# Patient Record
Sex: Female | Born: 1955 | Race: White | Hispanic: No | Marital: Married | Smoking: Never smoker
Health system: Southern US, Community
[De-identification: ages and names within clinical notes are randomized; demographics above are authoritative.]

## PROBLEM LIST (undated history)

## (undated) DIAGNOSIS — M26629 Arthralgia of temporomandibular joint, unspecified side: Secondary | ICD-10-CM

## (undated) DIAGNOSIS — B019 Varicella without complication: Secondary | ICD-10-CM

## (undated) DIAGNOSIS — M545 Low back pain: Secondary | ICD-10-CM

## (undated) DIAGNOSIS — B269 Mumps without complication: Secondary | ICD-10-CM

## (undated) DIAGNOSIS — M1711 Unilateral primary osteoarthritis, right knee: Secondary | ICD-10-CM

## (undated) DIAGNOSIS — M170 Bilateral primary osteoarthritis of knee: Secondary | ICD-10-CM

## (undated) DIAGNOSIS — M8580 Other specified disorders of bone density and structure, unspecified site: Secondary | ICD-10-CM

## (undated) DIAGNOSIS — N390 Urinary tract infection, site not specified: Secondary | ICD-10-CM

## (undated) DIAGNOSIS — E782 Mixed hyperlipidemia: Secondary | ICD-10-CM

## (undated) DIAGNOSIS — B059 Measles without complication: Secondary | ICD-10-CM

## (undated) DIAGNOSIS — G4700 Insomnia, unspecified: Secondary | ICD-10-CM

## (undated) DIAGNOSIS — R51 Headache: Secondary | ICD-10-CM

## (undated) DIAGNOSIS — Z87440 Personal history of urinary (tract) infections: Secondary | ICD-10-CM

## (undated) DIAGNOSIS — Z0000 Encounter for general adult medical examination without abnormal findings: Secondary | ICD-10-CM

## (undated) DIAGNOSIS — M533 Sacrococcygeal disorders, not elsewhere classified: Secondary | ICD-10-CM

## (undated) DIAGNOSIS — G4730 Sleep apnea, unspecified: Secondary | ICD-10-CM

## (undated) HISTORY — PX: WISDOM TOOTH EXTRACTION: SHX21

## (undated) HISTORY — DX: Bilateral primary osteoarthritis of knee: M17.0

## (undated) HISTORY — DX: Mumps without complication: B26.9

## (undated) HISTORY — DX: Headache: R51

## (undated) HISTORY — DX: Sacrococcygeal disorders, not elsewhere classified: M53.3

## (undated) HISTORY — DX: Arthralgia of temporomandibular joint, unspecified side: M26.629

## (undated) HISTORY — DX: Insomnia, unspecified: G47.00

## (undated) HISTORY — DX: Measles without complication: B05.9

## (undated) HISTORY — DX: Mixed hyperlipidemia: E78.2

## (undated) HISTORY — DX: Unilateral primary osteoarthritis, right knee: M17.11

## (undated) HISTORY — DX: Encounter for general adult medical examination without abnormal findings: Z00.00

## (undated) HISTORY — DX: Personal history of urinary (tract) infections: Z87.440

## (undated) HISTORY — DX: Sleep apnea, unspecified: G47.30

## (undated) HISTORY — DX: Low back pain: M54.5

## (undated) HISTORY — PX: APPENDECTOMY: SHX54

## (undated) HISTORY — DX: Other specified disorders of bone density and structure, unspecified site: M85.80

## (undated) HISTORY — PX: MOLE REMOVAL: SHX2046

## (undated) HISTORY — DX: Urinary tract infection, site not specified: N39.0

## (undated) HISTORY — DX: Varicella without complication: B01.9

---

## 1997-11-03 ENCOUNTER — Other Ambulatory Visit: Admission: RE | Admit: 1997-11-03 | Discharge: 1997-11-03 | Payer: Self-pay | Admitting: Obstetrics and Gynecology

## 1998-06-05 ENCOUNTER — Encounter: Payer: Self-pay | Admitting: Emergency Medicine

## 1998-06-05 ENCOUNTER — Emergency Department (HOSPITAL_COMMUNITY): Admission: EM | Admit: 1998-06-05 | Discharge: 1998-06-05 | Payer: Self-pay | Admitting: Emergency Medicine

## 1998-11-17 ENCOUNTER — Other Ambulatory Visit: Admission: RE | Admit: 1998-11-17 | Discharge: 1998-11-17 | Payer: Self-pay | Admitting: Obstetrics and Gynecology

## 1999-11-26 ENCOUNTER — Other Ambulatory Visit: Admission: RE | Admit: 1999-11-26 | Discharge: 1999-11-26 | Payer: Self-pay | Admitting: Obstetrics and Gynecology

## 2000-07-21 ENCOUNTER — Ambulatory Visit (HOSPITAL_BASED_OUTPATIENT_CLINIC_OR_DEPARTMENT_OTHER): Admission: RE | Admit: 2000-07-21 | Discharge: 2000-07-21 | Payer: Self-pay | Admitting: *Deleted

## 2000-11-19 ENCOUNTER — Other Ambulatory Visit: Admission: RE | Admit: 2000-11-19 | Discharge: 2000-11-19 | Payer: Self-pay | Admitting: Obstetrics and Gynecology

## 2001-11-24 ENCOUNTER — Other Ambulatory Visit: Admission: RE | Admit: 2001-11-24 | Discharge: 2001-11-24 | Payer: Self-pay | Admitting: Obstetrics and Gynecology

## 2002-07-16 ENCOUNTER — Encounter: Payer: Self-pay | Admitting: Family Medicine

## 2002-07-16 ENCOUNTER — Encounter: Admission: RE | Admit: 2002-07-16 | Discharge: 2002-07-16 | Payer: Self-pay | Admitting: Family Medicine

## 2002-11-29 ENCOUNTER — Other Ambulatory Visit: Admission: RE | Admit: 2002-11-29 | Discharge: 2002-11-29 | Payer: Self-pay | Admitting: Obstetrics and Gynecology

## 2003-12-14 ENCOUNTER — Other Ambulatory Visit: Admission: RE | Admit: 2003-12-14 | Discharge: 2003-12-14 | Payer: Self-pay | Admitting: Obstetrics and Gynecology

## 2004-03-16 ENCOUNTER — Encounter: Admission: RE | Admit: 2004-03-16 | Discharge: 2004-03-16 | Payer: Self-pay | Admitting: Family Medicine

## 2004-04-15 HISTORY — PX: OTHER SURGICAL HISTORY: SHX169

## 2004-09-19 ENCOUNTER — Encounter: Admission: RE | Admit: 2004-09-19 | Discharge: 2004-09-19 | Payer: Self-pay | Admitting: Family Medicine

## 2005-02-06 ENCOUNTER — Other Ambulatory Visit: Admission: RE | Admit: 2005-02-06 | Discharge: 2005-02-06 | Payer: Self-pay | Admitting: Obstetrics and Gynecology

## 2006-02-07 ENCOUNTER — Other Ambulatory Visit: Admission: RE | Admit: 2006-02-07 | Discharge: 2006-02-07 | Payer: Self-pay | Admitting: Obstetrics and Gynecology

## 2007-02-11 ENCOUNTER — Other Ambulatory Visit: Admission: RE | Admit: 2007-02-11 | Discharge: 2007-02-11 | Payer: Self-pay | Admitting: Obstetrics and Gynecology

## 2007-03-02 ENCOUNTER — Ambulatory Visit: Payer: Self-pay | Admitting: Internal Medicine

## 2007-03-17 ENCOUNTER — Ambulatory Visit: Payer: Self-pay | Admitting: Internal Medicine

## 2008-02-12 ENCOUNTER — Other Ambulatory Visit: Admission: RE | Admit: 2008-02-12 | Discharge: 2008-02-12 | Payer: Self-pay | Admitting: Obstetrics and Gynecology

## 2010-08-31 NOTE — Op Note (Signed)
[place]  Patient:    JANSEN, GOODPASTURE                        MRN: [id number] Proc. Date: 07/21/00 Adm. Date:  [id number] Disc. Date: [id number] Attending:  Melody Comas.                           Operative Report  PREOPERATIVE DIAGNOSES: 1. Dysplastic nevus of right medial thigh, 1.0 cm. 2. Dysplastic nevus of lower back, 1.0 cm.  POSTOPERATIVE DIAGNOSES: 1. Dysplastic nevus of right medial thigh, 1.0 cm. 2. Dysplastic nevus of lower back, 1.0 cm.  PROCEDURE: 1. Excisional biopsy of 1 cm dysplastic nevus of the right medial thigh. 2. Excisional biopsy of 1 cm dysplastic nevus of the lower back.  SURGEON:  Desmond Dike, M.D.  ANESTHESIA:  Local.  There is no first assistant.  INDICATIONS:  Dawnna Gritz is a 55 year old woman who presents with biopsy proven dysplastic nevi of the right medial thigh, and lower back.  She was referred to me for re-excision of these areas.  DESCRIPTION OF PROCEDURE:  The patient was brought back to the minor surgery operating room in the [facility] and placed on the table in the supine position.  The right lower extremity was slightly frog-legged to allow access to the medial thigh.  This area was sterilely prepped and draped. I designed an elliptically shaped excision along the long axis of the extremity with grossly clear tissue margins.  After infiltrating the soft tissues with 1% Xylocaine containing epinephrine this lesion was excised full thickness.  The resulting defect was closed in layers.  Interrupted, buried, Vicryl sutures were placed for wound edge approximation followed by a running monofilament 5-0 nylon suture for final epidermal leveling.  This area was sterilely dressed.  Attention was then directed to the lesion of the left lower back.  The patient was placed in the prone position to access this area.  This area was also sterilely prepped and sterilely draped.  This lesion  was excised elliptically with the long axis of the ellipse oriented transversely.  After infiltrating the soft tissues with 1% Xylocaine containing epinephrine, the lesion was excised full thickness.  This was passed off of the operative field as the first lesion had been for pathologic analysis.  The resulting defect was closed as previously described for the lesion of the medial thigh.  After sterilely dressing the lower back lesion, the patient was given wound care instructions.  She will plan on seeing me back in the office within the next week, for a wound check, suture removal, and review of her final pathology report. DD:  07/29/00 TD:  07/29/00 Job: [id number]

## 2011-12-04 DIAGNOSIS — G8929 Other chronic pain: Secondary | ICD-10-CM | POA: Insufficient documentation

## 2012-02-14 LAB — HM MAMMOGRAPHY: HM Mammogram: NORMAL

## 2012-08-07 ENCOUNTER — Other Ambulatory Visit: Payer: Self-pay | Admitting: Obstetrics & Gynecology

## 2012-08-07 ENCOUNTER — Ambulatory Visit (INDEPENDENT_AMBULATORY_CARE_PROVIDER_SITE_OTHER): Payer: BC Managed Care – PPO | Admitting: Obstetrics and Gynecology

## 2012-08-07 ENCOUNTER — Encounter: Payer: Self-pay | Admitting: Certified Nurse Midwife

## 2012-08-07 ENCOUNTER — Encounter: Payer: Self-pay | Admitting: Obstetrics and Gynecology

## 2012-08-07 VITALS — BP 120/70 | Ht 66.0 in | Wt 150.0 lb

## 2012-08-07 DIAGNOSIS — R300 Dysuria: Secondary | ICD-10-CM

## 2012-08-07 DIAGNOSIS — N952 Postmenopausal atrophic vaginitis: Secondary | ICD-10-CM

## 2012-08-07 LAB — POCT URINALYSIS DIPSTICK
Glucose, UA: NEGATIVE
Nitrite, UA: NEGATIVE
Urobilinogen, UA: NEGATIVE

## 2012-08-07 MED ORDER — ESTRADIOL 0.1 MG/GM VA CREA: TOPICAL_CREAM | VAGINAL | Status: DC

## 2012-08-07 NOTE — Progress Notes (Signed)
Patient ID: Ellen Morrow, female   DOB: 1955-05-14, 57 y.o.   MRN: [id number]  Subjective 57 year old Caucasian female with LMP 2 years ago presents in follow up to recent bladder infection treated at Urgent Care.  Had burning, frequency, and hematuria.  Treated with Septra DS. Doesn't hurt like it did but feels urgency.  Notes some burning at the end of the stream.  No hematuria.  No fevers or chills.  No nausea or vomiting. Denies vaginal discharge.  Notes vaginal dryness and dyspareunia.  States UTI occurred after intercourse.  Doubling dose of cranberry pills.  History of similar episode in fall 2013 when diagnosed with E Coli UTI.  Objective Normal external genitalia and urethra.  Vagina and cervix without lesions.  Yellow-orange very slight discharge.  No odor.  Uterus small and nontender.  No adnexal masses or tenderness.  Wet prep - No clue cells.  No hyphae.  PH 5.5 UA dip - negative  Assessment Status post post coital UTI Atrophic vaginitis.  Plan Urine culture sent. Told patient the acidity of the cranberry may cause some frequency of urination. Rx for Estrace vaginal cream.  See EPIC orders. Patient will come in for a recheck in 6 weeks to see response to Estrace.

## 2012-08-07 NOTE — Progress Notes (Signed)
Patient called through on-call service.  Prescription sent via EPIC today by Dr. Edward Jolly not at pharmacy.  Reviewed notes.  Patient with atrophic vaginitis.  Estrace vaginal cream prescribed and sent to pharmacy as documented.  Confirmed with pharmacy no RX received.  Called in Estrace cream 1gm pv twice weekly, #42.5 gram tube, 2RF as per note from today.  Spoke with patient and informed RX called in with refills as per Dr. Rica Records note.  Patient very appreciative.

## 2012-08-07 NOTE — Patient Instructions (Addendum)
Atrophic Vaginitis Atrophic vaginitis is a problem of low levels of estrogen in women. This problem can happen at any age. It is most common in women who have gone through menopause ("the change").  HOW WILL I KNOW IF I HAVE THIS PROBLEM? You may have:  Trouble with peeing (urinating), such as:  Going to the bathroom often.  A hard time holding your pee until you reach a bathroom.  Leaking pee.  Having pain when you pee.  Itching or a burning feeling.  Vaginal bleeding and spotting.  Pain during sex.  Dryness of the vagina.  A yellow, bad-smelling fluid (discharge) coming from the vagina. HOW WILL MY DOCTOR CHECK FOR THIS PROBLEM?  During your exam, your doctor will likely find the problem.  If there is a vaginal fluid, it may be checked for infection. HOW WILL THIS PROBLEM BE TREATED? Keep the vulvar skin as clean as possible. Moisturizers and lubricants can help with some of the symptoms. Estrogen replacement can help. There are 2 ways to take estrogen:  Systemic estrogen gets estrogen to your whole body. It takes many weeks or months before the symptoms get better.  You take an estrogen pill.  You use a skin patch. This is a patch that you put on your skin.  If you still have your uterus, your doctor may ask you to take a hormone. Talk to your doctor about the right medicine for you.  Estrogen cream.  This puts estrogen only at the part of your body where you apply it. The cream is put into the vagina or put on the vulvar skin. For some women, estrogen cream works faster than pills or the patch. CAN ALL WOMEN WITH THIS PROBLEM USE ESTROGEN? No. Women with certain types of cancer, liver problems, or problems with blood clots should not take estrogen. Your doctor can help you decide the best treatment for your symptoms. Document Released: 09/18/2007 Document Revised: 06/24/2011 Document Reviewed: 09/18/2007 Exit[employer] Patient Information 2013 [employer].  

## 2012-08-10 ENCOUNTER — Telehealth: Payer: Self-pay

## 2012-08-10 LAB — URINE CULTURE: Colony Count: 50000

## 2012-08-10 NOTE — Telephone Encounter (Signed)
Message copied by Alphonsa Overall on Mon Aug 10, 2012  4:05 PM ------      Message from: Conley Simmonds      Created: Mon Aug 10, 2012  7:52 AM       Please contact patient with urine culture results.  No one organism grew, just a mixtures of bacteria but a fair amount.  If still having any dysuria at all, please send to the pharmacy, Bactrim DS 1 tab po bid for 5 more days.            Thanks,            [employer] ------

## 2012-08-10 NOTE — Telephone Encounter (Signed)
Patient notified of urine culture results.  Called in [facility] to [facility].

## 2012-08-10 NOTE — Telephone Encounter (Signed)
lmovm to call for test results. 

## 2012-08-12 ENCOUNTER — Telehealth: Payer: Self-pay | Admitting: Obstetrics and Gynecology

## 2012-08-12 MED ORDER — CIPROFLOXACIN HCL 500 MG PO TABS: 500.0000 mg | ORAL_TABLET | Freq: Two times a day (BID) | ORAL | Status: DC

## 2012-08-12 NOTE — Telephone Encounter (Signed)
PT WOULD LIKE A FU VISIT WITH DR Edward Jolly TOMORROW IF POSSIBLE.  SHE IS NOT FEELING ANY BETTER.

## 2012-08-12 NOTE — Telephone Encounter (Signed)
Patient still with UTI symptoms and feels no better @ 5 days of Septra. Per Dr. Edward Jolly orders will change antibiotic to Cipro 500mg  BID x 7 days. Patient is to follow up with Dr. Edward Jolly @ 7 days of antibiotic to recheck urine and culture. Patient also instructed to continue use of vagenal cream as ordered 1/2 gram only. [facility].

## 2012-08-24 ENCOUNTER — Ambulatory Visit (INDEPENDENT_AMBULATORY_CARE_PROVIDER_SITE_OTHER): Payer: BC Managed Care – PPO

## 2012-08-24 DIAGNOSIS — N390 Urinary tract infection, site not specified: Secondary | ICD-10-CM

## 2012-08-24 LAB — POCT URINALYSIS DIPSTICK
Blood, UA: NEGATIVE
Ketones, UA: NEGATIVE
Protein, UA: NEGATIVE
pH, UA: 6

## 2012-08-25 LAB — URINE CULTURE: Colony Count: NO GROWTH

## 2012-09-28 ENCOUNTER — Ambulatory Visit: Payer: BC Managed Care – PPO | Admitting: Obstetrics and Gynecology

## 2012-10-01 ENCOUNTER — Ambulatory Visit (INDEPENDENT_AMBULATORY_CARE_PROVIDER_SITE_OTHER): Payer: BC Managed Care – PPO | Admitting: Obstetrics and Gynecology

## 2012-10-01 ENCOUNTER — Encounter: Payer: Self-pay | Admitting: Obstetrics and Gynecology

## 2012-10-01 VITALS — BP 120/70 | HR 68 | Ht 66.0 in | Wt 147.0 lb

## 2012-10-01 DIAGNOSIS — N390 Urinary tract infection, site not specified: Secondary | ICD-10-CM

## 2012-10-01 DIAGNOSIS — N952 Postmenopausal atrophic vaginitis: Secondary | ICD-10-CM

## 2012-10-01 DIAGNOSIS — Z87440 Personal history of urinary (tract) infections: Secondary | ICD-10-CM

## 2012-10-01 LAB — POCT URINALYSIS DIPSTICK
Protein, UA: NEGATIVE
Urobilinogen, UA: NEGATIVE
pH, UA: 5

## 2012-10-01 MED ORDER — NITROFURANTOIN MONOHYD MACRO 100 MG PO CAPS: 100.0000 mg | ORAL_CAPSULE | Freq: Two times a day (BID) | ORAL | Status: DC

## 2012-10-01 NOTE — Patient Instructions (Signed)
Please take the Macribid antibiotic 100 mg twice a day for three days if you develop pain and burning with urination. Call the office for an appointment if you do not improve after taking the antibiotic for two days.

## 2012-10-01 NOTE — Progress Notes (Signed)
Patient ID: Ellen Morrow, female   DOB: June 11, 1955, 57 y.o.   MRN: [id number]  Subjective  Patient here for atrophic vaginitis recheck and follow up culture for UTI.  Had UC 4/25 showing mixed bacteria 50,000 colonies and follow up UC 5/12 which was negative.  When has UTIs, symptoms come on quickly and has hematuria. Takes Pyridium until can get in to Urgent Care for visit.    Using vaginal estrogen cream Estrace for two weeks.  Forgetting to take medication.  Feels better when uses this.    Voiding after intercourse now.  Drinking less tea and more water.  Hasn't had UTIs up until November 2013.  No LMP in 2 years.    Some night sweats.  No hot flashes.    Objective  Urine dip - negative  Pelvic - normal external genitalia and urethra.  Thin white discharge in vagina.  No lesions.  Rugae noted.  Cervix no lesions.  Small and nontender uterus.  No adnexal masses or tenderness.  Assessment  Atrophic vaginitis. Recurrent UTIs.  Plan  Use Estrace 1/2 gram nightly twice a week. Rx for Macrobid 100 mg po bid for 3 days prn dysuria. Return to the office if take abx for 2 days and no improvement or if has hematuria so UC can be performed. Annual exam in January 2015.

## 2013-02-05 ENCOUNTER — Encounter: Payer: Self-pay | Admitting: Family Medicine

## 2013-02-05 ENCOUNTER — Ambulatory Visit (INDEPENDENT_AMBULATORY_CARE_PROVIDER_SITE_OTHER): Payer: BC Managed Care – PPO | Admitting: Family Medicine

## 2013-02-05 ENCOUNTER — Ambulatory Visit (HOSPITAL_BASED_OUTPATIENT_CLINIC_OR_DEPARTMENT_OTHER)
Admission: RE | Admit: 2013-02-05 | Discharge: 2013-02-05 | Disposition: A | Discharge status: Home / Self Care | Payer: BC Managed Care – PPO | Source: Ambulatory Visit | Attending: Family Medicine | Admitting: Family Medicine

## 2013-02-05 VITALS — BP 118/72 | HR 83 | Temp 98.3°F | Ht 66.0 in | Wt 154.0 lb

## 2013-02-05 DIAGNOSIS — M25569 Pain in unspecified knee: Secondary | ICD-10-CM | POA: Insufficient documentation

## 2013-02-05 DIAGNOSIS — G8929 Other chronic pain: Secondary | ICD-10-CM

## 2013-02-05 DIAGNOSIS — Z87440 Personal history of urinary (tract) infections: Secondary | ICD-10-CM

## 2013-02-05 DIAGNOSIS — M1711 Unilateral primary osteoarthritis, right knee: Secondary | ICD-10-CM

## 2013-02-05 DIAGNOSIS — Z0000 Encounter for general adult medical examination without abnormal findings: Secondary | ICD-10-CM

## 2013-02-05 DIAGNOSIS — N390 Urinary tract infection, site not specified: Secondary | ICD-10-CM

## 2013-02-05 DIAGNOSIS — Z23 Encounter for immunization: Secondary | ICD-10-CM

## 2013-02-05 MED ORDER — PHENAZOPYRIDINE HCL 200 MG PO TABS: 200.0000 mg | ORAL_TABLET | Freq: Three times a day (TID) | ORAL | Status: DC | PRN

## 2013-02-05 NOTE — Progress Notes (Signed)
Patient ID: Ellen Morrow, female   DOB: [birth year], 56 y.o.   MRN: [id number] CHRISTEN WARDROP [id number] 03/18/1956 02/05/2013      Progress Note-Follow Up  Subjective  Chief Complaint  Chief Complaint  Patient presents with  . Establish Care    new patient  . Injections    prevnar and td    HPI  Patient is a 57 year old Caucasian female who is in today to establish care. She is generally in good health although she has had 2 episodes of urinary tract infections in the last year. No symptoms at the present time. Her only other chief concern is of some anterior right knee pain and crepitus. Denies any significant injury swelling warmth or redness. No falls. No complaints of headache, chest pain, palpitations or shortness of breath. No GI concerns noted. Has historically just followed with OB/GYN.  Past Medical History  Diagnosis Date  . TMJ syndrome   . UTI (urinary tract infection)   . Chicken pox as a child  . Measles as a child  . Mumps as a child    Past Surgical History  Procedure Laterality Date  . Scar tissue break up  2006    Dr Gwendlyn Deutscher  . Wisdom tooth extraction  as a teenager  . Appendectomy  3 rd grade  . Mole removal      benign- mole on right thigh    Family History  Problem Relation Age of Onset  . Osteoporosis Mother   . Diabetes Mother     type 2  . Cancer Mother 46    stomach  . Colon polyps Mother     benign  . Hypertension Father   . Hyperlipidemia Father   . Other Sister     herniated disk in back, low blood pressure  . Hernia Brother     X 2  . Hypertension Sister   . Depression Sister     History   Social History  . Marital Status: Married    Spouse Name: N/A    Number of Children: N/A  . Years of Education: N/A   Occupational History  . Not on file.   Social History Main Topics  . Smoking status: Never Smoker   . Smokeless tobacco: Not on file  . Alcohol Use: Yes     Comment: occasional wine  . Drug Use: No  . Sexual  Activity: Yes    Birth Control/ Protection: Post-menopausal   Other Topics Concern  . Not on file   Social History Narrative  . No narrative on file    Current Outpatient Prescriptions on File Prior to Visit  Medication Sig Dispense Refill  . estradiol (ESTRACE) 0.1 MG/GM vaginal cream Use 1/2 g vaginally every night for the first 2 weeks, then use 1/2 g vaginally two or three times per week as needed to maintain symptom relief.  42.5 g  2  . nitrofurantoin, macrocrystal-monohydrate, (MACROBID) 100 MG capsule Take 1 capsule (100 mg total) by mouth 2 (two) times daily. Take 1 capsule (100mg ) by mouth 2 times daily for three days if your develop significant burning and discomfort with urination.  30 capsule  0   No current facility-administered medications on file prior to visit.    Allergies  Allergen Reactions  . Penicillins Rash    Review of Systems  Review of Systems  Constitutional: Negative for fever, chills and malaise/fatigue.  HENT: Negative for congestion, hearing loss and nosebleeds.   Eyes: Negative for  discharge.  Respiratory: Negative for cough, sputum production, shortness of breath and wheezing.   Cardiovascular: Negative for chest pain, palpitations and leg swelling.  Gastrointestinal: Negative for heartburn, nausea, vomiting, abdominal pain, diarrhea, constipation and blood in stool.  Genitourinary: Negative for dysuria, urgency, frequency and hematuria.  Musculoskeletal: Positive for joint pain. Negative for back pain, falls and myalgias.       Right knee pain and crepitus  Skin: Negative for rash.  Neurological: Negative for dizziness, tremors, sensory change, focal weakness, loss of consciousness, weakness and headaches.  Endo/Heme/Allergies: Negative for polydipsia. Does not bruise/bleed easily.  Psychiatric/Behavioral: Negative for depression and suicidal ideas. The patient is not nervous/anxious and does not have insomnia.     Objective  BP 118/72   Pulse 83  Temp(Src) 98.3 F (36.8 C) (Oral)  Ht 5\' 6"  (1.676 m)  Wt 154 lb (69.854 kg)  BMI 24.87 kg/m2  SpO2 97%  LMP 04/15/2008  Physical Exam  Physical Exam  Constitutional: She is oriented to person, place, and time and well-developed, well-nourished, and in no distress. No distress.  HENT:  Head: Normocephalic and atraumatic.  Eyes: Conjunctivae are normal.  Neck: Neck supple. No thyromegaly present.  Cardiovascular: Normal rate, regular rhythm and normal heart sounds.   No murmur heard. Pulmonary/Chest: Effort normal and breath sounds normal. She has no wheezes.  Abdominal: She exhibits no distension and no mass.  Musculoskeletal: She exhibits no edema.  Lymphadenopathy:    She has no cervical adenopathy.  Neurological: She is alert and oriented to person, place, and time.  Skin: Skin is warm and dry. No rash noted. She is not diaphoretic.  Psychiatric: Memory, affect and judgment normal.     Assessment & Plan   Preventative health care Encouraged heart healthy diet and regular exercise, given pneumonia shot today, took flu shot earlier in month. Encouraged daily fiber, probiotics, krill oil caps  Arthritis of right knee Describes discomfort and crepitus, encouraged krill oil and salon pas prn, stay active  Hx: UTI (urinary tract infection) Encouraged adequate hydration, cranberry tabs and probiotics. Already has Macrobid from her GYN to use prn is given some Pyridium to use prn for pain

## 2013-02-05 NOTE — Progress Notes (Signed)
Quick Note:  Patient Informed and voiced understanding. Pt states she doesn't want the referral right now she will call back if she changes your mind ______

## 2013-02-05 NOTE — Patient Instructions (Addendum)
Salon Pas patch or cream Krill oil caps such as MegaRed daily Calcium Citrate/vitamin D (citracal) Probiotic such as Digestive Advantage Fiber such as Psyllium   Preventive Care for Adults, Female A healthy lifestyle and preventive care can promote health and wellness. Preventive health guidelines for women include the following key practices.  A routine yearly physical is a good way to check with your caregiver about your health and preventive screening. It is a chance to share any concerns and updates on your health, and to receive a thorough exam.  Visit your dentist for a routine exam and preventive care every 6 months. Brush your teeth twice a day and floss once a day. Good oral hygiene prevents tooth decay and gum disease.  The frequency of eye exams is based on your age, health, family medical history, use of contact lenses, and other factors. Follow your caregiver's recommendations for frequency of eye exams.  Eat a healthy diet. Foods like vegetables, fruits, whole grains, low-fat dairy products, and lean protein foods contain the nutrients you need without too many calories. Decrease your intake of foods high in solid fats, added sugars, and salt. Eat the right amount of calories for you.Get information about a proper diet from your caregiver, if necessary.  Regular physical exercise is one of the most important things you can do for your health. Most adults should get at least 150 minutes of moderate-intensity exercise (any activity that increases your heart rate and causes you to sweat) each week. In addition, most adults need muscle-strengthening exercises on 2 or more days a week.  Maintain a healthy weight. The body mass index (BMI) is a screening tool to identify possible weight problems. It provides an estimate of body fat based on height and weight. Your caregiver can help determine your BMI, and can help you achieve or maintain a healthy weight.For adults 20 years and  older:  A BMI below 18.5 is considered underweight.  A BMI of 18.5 to 24.9 is normal.  A BMI of 25 to 29.9 is considered overweight.  A BMI of 30 and above is considered obese.  Maintain normal blood lipids and cholesterol levels by exercising and minimizing your intake of saturated fat. Eat a balanced diet with plenty of fruit and vegetables. Blood tests for lipids and cholesterol should begin at age 46 and be repeated every 5 years. If your lipid or cholesterol levels are high, you are over 50, or you are at high risk for heart disease, you may need your cholesterol levels checked more frequently.Ongoing high lipid and cholesterol levels should be treated with medicines if diet and exercise are not effective.  If you smoke, find out from your caregiver how to quit. If you do not use tobacco, do not start.  If you are pregnant, do not drink alcohol. If you are breastfeeding, be very cautious about drinking alcohol. If you are not pregnant and choose to drink alcohol, do not exceed 1 drink per day. One drink is considered to be 12 ounces (355 mL) of beer, 5 ounces (148 mL) of wine, or 1.5 ounces (44 mL) of liquor.  Avoid use of street drugs. Do not share needles with anyone. Ask for help if you need support or instructions about stopping the use of drugs.  High blood pressure causes heart disease and increases the risk of stroke. Your blood pressure should be checked at least every 1 to 2 years. Ongoing high blood pressure should be treated with medicines if weight loss  and exercise are not effective.  If you are 30 to 56 years old, ask your caregiver if you should take aspirin to prevent strokes.  Diabetes screening involves taking a blood sample to check your fasting blood sugar level. This should be done once every 3 years, after age 88, if you are within normal weight and without risk factors for diabetes. Testing should be considered at a younger age or be carried out more frequently if  you are overweight and have at least 1 risk factor for diabetes.  Breast cancer screening is essential preventive care for women. You should practice "breast self-awareness." This means understanding the normal appearance and feel of your breasts and may include breast self-examination. Any changes detected, no matter how small, should be reported to a caregiver. Women in their 68s and 30s should have a clinical breast exam (CBE) by a caregiver as part of a regular health exam every 1 to 3 years. After age 60, women should have a CBE every year. Starting at age 4, women should consider having a mammography (breast X-ray test) every year. Women who have a family history of breast cancer should talk to their caregiver about genetic screening. Women at a high risk of breast cancer should talk to their caregivers about having magnetic resonance imaging (MRI) and a mammography every year.  The Pap test is a screening test for cervical cancer. A Pap test can show cell changes on the cervix that might become cervical cancer if left untreated. A Pap test is a procedure in which cells are obtained and examined from the lower end of the uterus (cervix).  Women should have a Pap test starting at age 58.  Between ages 3 and 77, Pap tests should be repeated every 2 years.  Beginning at age 63, you should have a Pap test every 3 years as long as the past 3 Pap tests have been normal.  Some women have medical problems that increase the chance of getting cervical cancer. Talk to your caregiver about these problems. It is especially important to talk to your caregiver if a new problem develops soon after your last Pap test. In these cases, your caregiver may recommend more frequent screening and Pap tests.  The above recommendations are the same for women who have or have not gotten the vaccine for human papillomavirus (HPV).  If you had a hysterectomy for a problem that was not cancer or a condition that could  lead to cancer, then you no longer need Pap tests. Even if you no longer need a Pap test, a regular exam is a good idea to make sure no other problems are starting.  If you are between ages 79 and 58, and you have had normal Pap tests going back 10 years, you no longer need Pap tests. Even if you no longer need a Pap test, a regular exam is a good idea to make sure no other problems are starting.  If you have had past treatment for cervical cancer or a condition that could lead to cancer, you need Pap tests and screening for cancer for at least 20 years after your treatment.  If Pap tests have been discontinued, risk factors (such as a new sexual partner) need to be reassessed to determine if screening should be resumed.  The HPV test is an additional test that may be used for cervical cancer screening. The HPV test looks for the virus that can cause the cell changes on the cervix. The  cells collected during the Pap test can be tested for HPV. The HPV test could be used to screen women aged 109 years and older, and should be used in women of any age who have unclear Pap test results. After the age of 76, women should have HPV testing at the same frequency as a Pap test.  Colorectal cancer can be detected and often prevented. Most routine colorectal cancer screening begins at the age of 11 and continues through age 71. However, your caregiver may recommend screening at an earlier age if you have risk factors for colon cancer. On a yearly basis, your caregiver may provide home test kits to check for hidden blood in the stool. Use of a small camera at the end of a tube, to directly examine the colon (sigmoidoscopy or colonoscopy), can detect the earliest forms of colorectal cancer. Talk to your caregiver about this at age 61, when routine screening begins. Direct examination of the colon should be repeated every 5 to 10 years through age 38, unless early forms of pre-cancerous polyps or small growths are  found.  Hepatitis C blood testing is recommended for all people born from [birth year] through 1965 and any individual with known risks for hepatitis C.  Practice safe sex. Use condoms and avoid high-risk sexual practices to reduce the spread of sexually transmitted infections (STIs). STIs include gonorrhea, chlamydia, syphilis, trichomonas, herpes, HPV, and human immunodeficiency virus (HIV). Herpes, HIV, and HPV are viral illnesses that have no cure. They can result in disability, cancer, and death. Sexually active women aged 7 and younger should be checked for chlamydia. Older women with new or multiple partners should also be tested for chlamydia. Testing for other STIs is recommended if you are sexually active and at increased risk.  Osteoporosis is a disease in which the bones lose minerals and strength with aging. This can result in serious bone fractures. The risk of osteoporosis can be identified using a bone density scan. Women ages 50 and over and women at risk for fractures or osteoporosis should discuss screening with their caregivers. Ask your caregiver whether you should take a calcium supplement or vitamin D to reduce the rate of osteoporosis.  Menopause can be associated with physical symptoms and risks. Hormone replacement therapy is available to decrease symptoms and risks. You should talk to your caregiver about whether hormone replacement therapy is right for you.  Use sunscreen with sun protection factor (SPF) of 30 or more. Apply sunscreen liberally and repeatedly throughout the day. You should seek shade when your shadow is shorter than you. Protect yourself by wearing long sleeves, pants, a wide-brimmed hat, and sunglasses year round, whenever you are outdoors.  Once a month, do a whole body skin exam, using a mirror to look at the skin on your back. Notify your caregiver of new moles, moles that have irregular borders, moles that are larger than a pencil eraser, or moles that have  changed in shape or color.  Stay current with required immunizations.  Influenza. You need a dose every fall (or winter). The composition of the flu vaccine changes each year, so being vaccinated once is not enough.  Pneumococcal polysaccharide. You need 1 to 2 doses if you smoke cigarettes or if you have certain chronic medical conditions. You need 1 dose at age 4 (or older) if you have never been vaccinated.  Tetanus, diphtheria, pertussis (Tdap, Td). Get 1 dose of Tdap vaccine if you are younger than age 33, are over 5  and have contact with an infant, are a Dietitian, are pregnant, or simply want to be protected from whooping cough. After that, you need a Td booster dose every 10 years. Consult your caregiver if you have not had at least 3 tetanus and diphtheria-containing shots sometime in your life or have a deep or dirty wound.  HPV. You need this vaccine if you are a woman age 60 or younger. The vaccine is given in 3 doses over 6 months.  Measles, mumps, rubella (MMR). You need at least 1 dose of MMR if you were born in 1957 or later. You may also need a second dose.  Meningococcal. If you are age 45 to 74 and a first-year college student living in a residence hall, or have one of several medical conditions, you need to get vaccinated against meningococcal disease. You may also need additional booster doses.  Zoster (shingles). If you are age 8 or older, you should get this vaccine.  Varicella (chickenpox). If you have never had chickenpox or you were vaccinated but received only 1 dose, talk to your caregiver to find out if you need this vaccine.  Hepatitis A. You need this vaccine if you have a specific risk factor for hepatitis A virus infection or you simply wish to be protected from this disease. The vaccine is usually given as 2 doses, 6 to 18 months apart.  Hepatitis B. You need this vaccine if you have a specific risk factor for hepatitis B virus infection or you  simply wish to be protected from this disease. The vaccine is given in 3 doses, usually over 6 months. Preventive Services / Frequency Ages 78 to 33  Blood pressure check.** / Every 1 to 2 years.  Lipid and cholesterol check.** / Every 5 years beginning at age 4.  Clinical breast exam.** / Every 3 years for women in their 42s and 71s.  Pap test.** / Every 2 years from ages 40 through 104. Every 3 years starting at age 76 through age 11 or 91 with a history of 3 consecutive normal Pap tests.  HPV screening.** / Every 3 years from ages 28 through ages 33 to 23 with a history of 3 consecutive normal Pap tests.  Hepatitis C blood test.** / For any individual with known risks for hepatitis C.  Skin self-exam. / Monthly.  Influenza immunization.** / Every year.  Pneumococcal polysaccharide immunization.** / 1 to 2 doses if you smoke cigarettes or if you have certain chronic medical conditions.  Tetanus, diphtheria, pertussis (Tdap, Td) immunization. / A one-time dose of Tdap vaccine. After that, you need a Td booster dose every 10 years.  HPV immunization. / 3 doses over 6 months, if you are 19 and younger.  Measles, mumps, rubella (MMR) immunization. / You need at least 1 dose of MMR if you were born in 1957 or later. You may also need a second dose.  Meningococcal immunization. / 1 dose if you are age 7 to 44 and a first-year college student living in a residence hall, or have one of several medical conditions, you need to get vaccinated against meningococcal disease. You may also need additional booster doses.  Varicella immunization.** / Consult your caregiver.  Hepatitis A immunization.** / Consult your caregiver. 2 doses, 6 to 18 months apart.  Hepatitis B immunization.** / Consult your caregiver. 3 doses usually over 6 months. Ages 24 to 65  Blood pressure check.** / Every 1 to 2 years.  Lipid and cholesterol check.** /  Every 5 years beginning at age 42.  Clinical breast  exam.** / Every year after age 59.  Mammogram.** / Every year beginning at age 24 and continuing for as long as you are in good health. Consult with your caregiver.  Pap test.** / Every 3 years starting at age 76 through age 20 or 24 with a history of 3 consecutive normal Pap tests.  HPV screening.** / Every 3 years from ages 34 through ages 81 to 22 with a history of 3 consecutive normal Pap tests.  Fecal occult blood test (FOBT) of stool. / Every year beginning at age 41 and continuing until age 50. You may not need to do this test if you get a colonoscopy every 10 years.  Flexible sigmoidoscopy or colonoscopy.** / Every 5 years for a flexible sigmoidoscopy or every 10 years for a colonoscopy beginning at age 73 and continuing until age 44.  Hepatitis C blood test.** / For all people born from [birth year] through 1965 and any individual with known risks for hepatitis C.  Skin self-exam. / Monthly.  Influenza immunization.** / Every year.  Pneumococcal polysaccharide immunization.** / 1 to 2 doses if you smoke cigarettes or if you have certain chronic medical conditions.  Tetanus, diphtheria, pertussis (Tdap, Td) immunization.** / A one-time dose of Tdap vaccine. After that, you need a Td booster dose every 10 years.  Measles, mumps, rubella (MMR) immunization. / You need at least 1 dose of MMR if you were born in 1957 or later. You may also need a second dose.  Varicella immunization.** / Consult your caregiver.  Meningococcal immunization.** / Consult your caregiver.  Hepatitis A immunization.** / Consult your caregiver. 2 doses, 6 to 18 months apart.  Hepatitis B immunization.** / Consult your caregiver. 3 doses, usually over 6 months. Ages 52 and over  Blood pressure check.** / Every 1 to 2 years.  Lipid and cholesterol check.** / Every 5 years beginning at age 30.  Clinical breast exam.** / Every year after age 47.  Mammogram.** / Every year beginning at age 36 and continuing  for as long as you are in good health. Consult with your caregiver.  Pap test.** / Every 3 years starting at age 36 through age 43 or 55 with a 3 consecutive normal Pap tests. Testing can be stopped between 65 and 70 with 3 consecutive normal Pap tests and no abnormal Pap or HPV tests in the past 10 years.  HPV screening.** / Every 3 years from ages 9 through ages 7 or 50 with a history of 3 consecutive normal Pap tests. Testing can be stopped between 65 and 70 with 3 consecutive normal Pap tests and no abnormal Pap or HPV tests in the past 10 years.  Fecal occult blood test (FOBT) of stool. / Every year beginning at age 63 and continuing until age 84. You may not need to do this test if you get a colonoscopy every 10 years.  Flexible sigmoidoscopy or colonoscopy.** / Every 5 years for a flexible sigmoidoscopy or every 10 years for a colonoscopy beginning at age 62 and continuing until age 63.  Hepatitis C blood test.** / For all people born from [birth year] through 1965 and any individual with known risks for hepatitis C.  Osteoporosis screening.** / A one-time screening for women ages 63 and over and women at risk for fractures or osteoporosis.  Skin self-exam. / Monthly.  Influenza immunization.** / Every year.  Pneumococcal polysaccharide immunization.** / 1 dose at age 30 (  or older) if you have never been vaccinated.  Tetanus, diphtheria, pertussis (Tdap, Td) immunization. / A one-time dose of Tdap vaccine if you are over 65 and have contact with an infant, are a Research scientist (physical sciences), or simply want to be protected from whooping cough. After that, you need a Td booster dose every 10 years.  Varicella immunization.** / Consult your caregiver.  Meningococcal immunization.** / Consult your caregiver.  Hepatitis A immunization.** / Consult your caregiver. 2 doses, 6 to 18 months apart.  Hepatitis B immunization.** / Check with your caregiver. 3 doses, usually over 6 months. ** Family history  and personal history of risk and conditions may change your caregiver's recommendations. Document Released: 05/28/2001 Document Revised: 06/24/2011 Document Reviewed: 08/27/2010 [facility] Patient Information 2014 [place], [place].

## 2013-02-07 ENCOUNTER — Encounter: Payer: Self-pay | Admitting: Family Medicine

## 2013-02-07 DIAGNOSIS — Z87440 Personal history of urinary (tract) infections: Secondary | ICD-10-CM

## 2013-02-07 DIAGNOSIS — Z0000 Encounter for general adult medical examination without abnormal findings: Secondary | ICD-10-CM

## 2013-02-07 DIAGNOSIS — M170 Bilateral primary osteoarthritis of knee: Secondary | ICD-10-CM

## 2013-02-07 DIAGNOSIS — M1711 Unilateral primary osteoarthritis, right knee: Secondary | ICD-10-CM

## 2013-02-07 HISTORY — DX: Unilateral primary osteoarthritis, right knee: M17.11

## 2013-02-07 HISTORY — DX: Personal history of urinary (tract) infections: Z87.440

## 2013-02-07 HISTORY — DX: Encounter for general adult medical examination without abnormal findings: Z00.00

## 2013-02-07 HISTORY — DX: Bilateral primary osteoarthritis of knee: M17.0

## 2013-02-07 NOTE — Assessment & Plan Note (Addendum)
Encouraged adequate hydration, cranberry tabs and probiotics. Already has Macrobid from her GYN to use prn is given some Pyridium to use prn for pain

## 2013-02-07 NOTE — Assessment & Plan Note (Addendum)
Encouraged heart healthy diet and regular exercise, given pneumonia shot today, took flu shot earlier in month. Encouraged daily fiber, probiotics, krill oil caps

## 2013-02-07 NOTE — Assessment & Plan Note (Signed)
Describes discomfort and crepitus, encouraged krill oil and salon pas prn, stay active

## 2013-02-10 ENCOUNTER — Telehealth: Payer: Self-pay | Admitting: Family Medicine

## 2013-02-10 NOTE — Telephone Encounter (Signed)
Received medical records from [facility] at [employer]  P: [phone] F: [phone]

## 2013-02-10 NOTE — Telephone Encounter (Signed)
Received medical records from [facility]  P: [phone] F: [phone]

## 2013-02-18 ENCOUNTER — Other Ambulatory Visit: Payer: Self-pay

## 2013-03-15 ENCOUNTER — Encounter: Payer: Self-pay | Admitting: Obstetrics and Gynecology

## 2013-05-13 ENCOUNTER — Ambulatory Visit: Payer: Self-pay | Admitting: Obstetrics and Gynecology

## 2013-05-14 ENCOUNTER — Ambulatory Visit: Payer: Self-pay | Admitting: Obstetrics and Gynecology

## 2013-05-17 ENCOUNTER — Ambulatory Visit (INDEPENDENT_AMBULATORY_CARE_PROVIDER_SITE_OTHER): Payer: BC Managed Care – PPO | Admitting: Obstetrics and Gynecology

## 2013-05-17 ENCOUNTER — Encounter: Payer: Self-pay | Admitting: Obstetrics and Gynecology

## 2013-05-17 VITALS — BP 100/67 | HR 77 | Resp 16 | Ht 66.25 in | Wt 152.0 lb

## 2013-05-17 DIAGNOSIS — Z0000 Encounter for general adult medical examination without abnormal findings: Secondary | ICD-10-CM

## 2013-05-17 DIAGNOSIS — Z01419 Encounter for gynecological examination (general) (routine) without abnormal findings: Secondary | ICD-10-CM

## 2013-05-17 DIAGNOSIS — N952 Postmenopausal atrophic vaginitis: Secondary | ICD-10-CM

## 2013-05-17 LAB — POCT URINALYSIS DIPSTICK
BILIRUBIN UA: NEGATIVE
Blood, UA: NEGATIVE
GLUCOSE UA: NEGATIVE
Ketones, UA: NEGATIVE
Leukocytes, UA: NEGATIVE
Nitrite, UA: NEGATIVE
Protein, UA: NEGATIVE
Urobilinogen, UA: NEGATIVE
pH, UA: 5

## 2013-05-17 MED ORDER — ESTRADIOL 0.1 MG/GM VA CREA: TOPICAL_CREAM | VAGINAL | Status: DC

## 2013-05-17 NOTE — Patient Instructions (Signed)

## 2013-05-17 NOTE — Progress Notes (Signed)
GYNECOLOGY VISIT  PCP:  Mallory Shirk, MD  Referring provider:   HPI: 58 y.o.   Married  Caucasian  female   G2P2 with Patient's last menstrual period was 04/15/2008.   here for   Annual Exam Since June 2014, took Nitrofurantoin 100 mg po bid for 3 days, to treat UTI symptoms.  Needs refill on Estrace vaginal cream.  Uses for dryness and for prevention of UTIs.  Hgb:  PCP Urine:  Neg  GYNECOLOGIC HISTORY: Patient's last menstrual period was 04/15/2008. Sexually active:  yes Partner preference: female Contraception:  Menopausal  Menopausal hormone therapy: yes DES exposure:   no Blood transfusions:   no Sexually transmitted diseases:  no  GYN Procedures:  no Mammogram:   04/2013 normal ([facility])         Pap:   05/12/12 neg, negative HR HPV History of abnormal pap smear:  no   OB History   Grav Para Term Preterm Abortions TAB SAB Ect Mult Living   2 2        2        LIFESTYLE: Exercise:   Cardio twice a week            Tobacco: no Alcohol: occ glass of wine Drug use:  no  OTHER HEALTH MAINTENANCE: Tetanus/TDap: 2014 Gardisil: no Influenza: yes  Zostavax: no  Bone density: 2002 normal Colonoscopy: 2008 normal, repeat 10 years  Cholesterol check: 04/2011 total  225 LDL 123  HDL 73    Family History  Problem Relation Age of Onset  . Osteoporosis Mother   . Diabetes Mother     type 2  . Cancer Mother 51    stomach  . Colon polyps Mother     benign  . Hypertension Father   . Hyperlipidemia Father   . Other Sister     herniated disk in back, low blood pressure  . Hernia Brother     X 2  . Hypertension Sister   . Depression Sister     Patient Active Problem List   Diagnosis Date Noted  . Preventative health care 02/07/2013  . Hx: UTI (urinary tract infection) 02/07/2013  . Arthritis of right knee 02/07/2013   Past Medical History  Diagnosis Date  . TMJ syndrome   . UTI (urinary tract infection)   . Chicken pox as a child  . Measles as a child  .  Mumps as a child  . Preventative health care 02/07/2013  . Hx: UTI (urinary tract infection) 02/07/2013  . Arthritis of right knee 02/07/2013    Past Surgical History  Procedure Laterality Date  . Scar tissue break up  2006    Dr Alfred Levins  . Wisdom tooth extraction  as a teenager  . Appendectomy  3 rd grade  . Mole removal      benign- mole on right thigh    ALLERGIES: Penicillins  Current Outpatient Prescriptions  Medication Sig Dispense Refill  . estradiol (ESTRACE) 0.1 MG/GM vaginal cream Use 1/2 g vaginally every night for the first 2 weeks, then use 1/2 g vaginally two or three times per week as needed to maintain symptom relief.  42.5 g  2  . nitrofurantoin, macrocrystal-monohydrate, (MACROBID) 100 MG capsule Take 1 capsule (100 mg total) by mouth 2 (two) times daily. Take 1 capsule (100mg ) by mouth 2 times daily for three days if your develop significant burning and discomfort with urination.  30 capsule  0  . phenazopyridine (PYRIDIUM) 200 MG tablet Take  1 tablet (200 mg total) by mouth 3 (three) times daily as needed for pain.  10 tablet  1   No current facility-administered medications for this visit.     ROS:  Pertinent items are noted in HPI.  SOCIAL HISTORY:  Married.   PHYSICAL EXAMINATION:    BP 100/67  Pulse 77  Resp 16  Ht 5' 6.25" (1.683 m)  Wt 152 lb (68.947 kg)  BMI 24.34 kg/m2  LMP 04/15/2008   Wt Readings from Last 3 Encounters:  05/17/13 152 lb (68.947 kg)  02/05/13 154 lb (69.854 kg)  10/01/12 147 lb (66.679 kg)     Ht Readings from Last 3 Encounters:  05/17/13 5' 6.25" (1.683 m)  02/05/13 5\' 6"  (1.676 m)  10/01/12 5\' 6"  (1.676 m)    General appearance: alert, cooperative and appears stated age Head: Normocephalic, without obvious abnormality, atraumatic Neck: no adenopathy, supple, symmetrical, trachea midline and thyroid not enlarged, symmetric, no tenderness/mass/nodules Lungs: clear to auscultation bilaterally Breasts: Inspection  negative, No nipple retraction or dimpling, No nipple discharge or bleeding, No axillary or supraclavicular adenopathy, Normal to palpation without dominant masses Heart: regular rate and rhythm Abdomen: soft, non-tender; no masses,  no organomegaly Extremities: extremities normal, atraumatic, no cyanosis or edema Skin: Skin color, texture, turgor normal. No rashes or lesions Lymph nodes: Cervical, supraclavicular, and axillary nodes normal. No abnormal inguinal nodes palpated Neurologic: Grossly normal  Pelvic: External genitalia:  no lesions              Urethra:  normal appearing urethra with no masses, tenderness or lesions              Bartholins and Skenes: normal                 Vagina: normal appearing vagina with normal color and discharge, no lesions              Cervix: normal appearance              Pap and high risk HPV testing done: no.            Bimanual Exam:  Uterus:  uterus is normal size, shape, consistency and nontender                                      Adnexa: normal adnexa in size, nontender and no masses                                      Rectovaginal: Confirms                                      Anus:  normal sphincter tone, no lesions  ASSESSMENT  Normal gynecologic exam. History of recurrent UTIs. Atrophic vaginitis.   PLAN  Mammogram yearly. Encouraged self breast exams. Pap smear and high risk HPV testing in 2019 Counseled on Estrace cream use.  Risks and benefits discussed.   Medications per Epic orders Labs with PCP. Return annually or prn   An After Visit Summary was printed and given to the patient.

## 2013-05-18 ENCOUNTER — Encounter: Payer: BC Managed Care – PPO | Admitting: Family Medicine

## 2013-05-24 ENCOUNTER — Telehealth: Payer: Self-pay | Admitting: Family Medicine

## 2013-05-24 NOTE — Telephone Encounter (Signed)
Received medical records from Dr. Elza Rafter office

## 2013-06-14 ENCOUNTER — Ambulatory Visit (INDEPENDENT_AMBULATORY_CARE_PROVIDER_SITE_OTHER): Payer: BC Managed Care – PPO | Admitting: Family Medicine

## 2013-06-14 ENCOUNTER — Encounter: Payer: Self-pay | Admitting: Family Medicine

## 2013-06-14 VITALS — BP 118/86 | HR 68 | Temp 98.5°F | Ht 66.0 in | Wt 152.1 lb

## 2013-06-14 DIAGNOSIS — E785 Hyperlipidemia, unspecified: Secondary | ICD-10-CM

## 2013-06-14 DIAGNOSIS — N390 Urinary tract infection, site not specified: Secondary | ICD-10-CM

## 2013-06-14 DIAGNOSIS — Z0000 Encounter for general adult medical examination without abnormal findings: Secondary | ICD-10-CM

## 2013-06-14 DIAGNOSIS — Z87440 Personal history of urinary (tract) infections: Secondary | ICD-10-CM

## 2013-06-14 NOTE — Progress Notes (Signed)
Pre visit review using our clinic review tool, if applicable. No additional management support is needed unless otherwise documented below in the visit note. 

## 2013-06-14 NOTE — Patient Instructions (Signed)

## 2013-06-14 NOTE — Progress Notes (Signed)
Patient ID: Ellen Morrow, female   DOB: 1955-09-06, 58 y.o.   MRN: [id number] Ellen Morrow [id number] 16-Jun-1955 06/14/2013      Progress Note-Follow Up  Subjective  Chief Complaint  Chief Complaint  Patient presents with  . Annual Exam    physical    HPI  Patient is a 58 yo female in today for annual exam. Feeling well today. Had a recen tUTI but it has been well treated. Denies any f/c/ha/cp/palp/sob/gi or gu concerns. Has been struggling with right knee pain. Follows with GYN  Past Medical History  Diagnosis Date  . TMJ syndrome   . UTI (urinary tract infection)   . Chicken pox as a child  . Measles as a child  . Mumps as a child  . Preventative health care 02/07/2013  . Hx: UTI (urinary tract infection) 02/07/2013  . Arthritis of right knee 02/07/2013    Past Surgical History  Procedure Laterality Date  . Scar tissue break up  2006    Dr Alfred Levins  . Wisdom tooth extraction  as a teenager  . Appendectomy  3 rd grade  . Mole removal      benign- mole on right thigh    Family History  Problem Relation Age of Onset  . Osteoporosis Mother   . Diabetes Mother     type 2  . Cancer Mother 17    stomach  . Colon polyps Mother     benign  . Hypertension Father   . Hyperlipidemia Father   . Other Sister     herniated disk in back, low blood pressure  . Hernia Brother     X 2  . Hypertension Sister   . Depression Sister     History   Social History  . Marital Status: Married    Spouse Name: N/A    Number of Children: N/A  . Years of Education: N/A   Occupational History  . Not on file.   Social History Main Topics  . Smoking status: Never Smoker   . Smokeless tobacco: Never Used  . Alcohol Use: 0.5 oz/week    1 drink(s) per week     Comment: occasional wine  . Drug Use: No  . Sexual Activity: Yes    Partners: Male    Birth Control/ Protection: Post-menopausal   Other Topics Concern  . Not on file   Social History Narrative  . No  narrative on file    Current Outpatient Prescriptions on File Prior to Visit  Medication Sig Dispense Refill  . estradiol (ESTRACE) 0.1 MG/GM vaginal cream Use 1/2 g vaginally every night for the first 2 weeks, then use 1/2 g vaginally two or three times per week as needed to maintain symptom relief.  42.5 g  2   No current facility-administered medications on file prior to visit.    Allergies  Allergen Reactions  . Penicillins Rash    Review of Systems  Review of Systems  Constitutional: Negative for fever, chills and malaise/fatigue.  HENT: Negative for congestion, hearing loss and nosebleeds.   Eyes: Negative for discharge.  Respiratory: Negative for cough, sputum production, shortness of breath and wheezing.   Cardiovascular: Negative for chest pain, palpitations and leg swelling.  Gastrointestinal: Negative for heartburn, nausea, vomiting, abdominal pain, diarrhea, constipation and blood in stool.  Genitourinary: Negative for dysuria, urgency, frequency and hematuria.  Musculoskeletal: Negative for back pain, falls and myalgias.  Skin: Negative for rash.  Neurological: Negative for dizziness,  tremors, sensory change, focal weakness, loss of consciousness, weakness and headaches.  Endo/Heme/Allergies: Negative for polydipsia. Does not bruise/bleed easily.  Psychiatric/Behavioral: Negative for depression and suicidal ideas. The patient is not nervous/anxious and does not have insomnia.     Objective  BP 118/86  Pulse 68  Temp(Src) 98.5 F (36.9 C) (Oral)  Ht 5\' 6"  (1.676 m)  Wt 152 lb 1.9 oz (69.001 kg)  BMI 24.56 kg/m2  SpO2 97%  LMP 04/15/2008  Physical Exam  Physical Exam  Constitutional: She is oriented to person, place, and time and well-developed, well-nourished, and in no distress. No distress.  HENT:  Head: Normocephalic and atraumatic.  Right Ear: External ear normal.  Left Ear: External ear normal.  Nose: Nose normal.  Mouth/Throat: Oropharynx is  clear and moist. No oropharyngeal exudate.  Eyes: Conjunctivae are normal. Pupils are equal, round, and reactive to light. Right eye exhibits no discharge. Left eye exhibits no discharge. No scleral icterus.  Neck: Normal range of motion. Neck supple. No thyromegaly present.  Cardiovascular: Normal rate, regular rhythm, normal heart sounds and intact distal pulses.   No murmur heard. Pulmonary/Chest: Effort normal and breath sounds normal. No respiratory distress. She has no wheezes. She has no rales.  Abdominal: Soft. Bowel sounds are normal. She exhibits no distension and no mass. There is no tenderness.  Musculoskeletal: Normal range of motion. She exhibits no edema and no tenderness.  Lymphadenopathy:    She has no cervical adenopathy.  Neurological: She is alert and oriented to person, place, and time. She has normal reflexes. No cranial nerve deficit. Coordination normal.  Skin: Skin is warm and dry. No rash noted. She is not diaphoretic.  Psychiatric: Mood, memory and affect normal.      Assessment & Plan    Hx: UTI (urinary tract infection) Recently treated by her GYN. Asymptomatic today.  Preventative health care Encouraged heart healthy diet and regular exercise, encouraged adequate sleep. Encouraged probiotics, reviewed annual labs

## 2013-06-17 ENCOUNTER — Encounter: Payer: BC Managed Care – PPO | Admitting: Family Medicine

## 2013-06-19 NOTE — Assessment & Plan Note (Addendum)
Encouraged heart healthy diet and regular exercise, encouraged adequate sleep. Encouraged probiotics, reviewed annual labs

## 2013-06-19 NOTE — Assessment & Plan Note (Signed)
Recently treated by her GYN. Asymptomatic today.

## 2013-07-02 ENCOUNTER — Telehealth: Payer: Self-pay

## 2013-07-02 NOTE — Telephone Encounter (Signed)
Sorry sent this to [facility] accidentally please just forward to patient in My chart

## 2013-07-02 NOTE — Telephone Encounter (Signed)
Patient left a message stating that she needs to know the procedure codes for renal and hepatic. Patient is verifying with insurance to see if they will cover these tests to be done and they need the procedure codes?  Message can be sent back on mychart

## 2013-07-02 NOTE — Telephone Encounter (Signed)
V70.0 for preventative yearly for both and V13.2 for history of Urinary Tract Infection for renal panel

## 2013-07-27 LAB — LIPID PANEL
CHOL/HDL RATIO: 3.3 ratio
Cholesterol: 212 mg/dL — ABNORMAL HIGH (ref 0–200)
HDL: 64 mg/dL (ref >39)
LDL CALC: 128 mg/dL — AB (ref 0–99)
Triglycerides: 99 mg/dL (ref <150)
VLDL: 20 mg/dL (ref 0–40)

## 2013-07-27 LAB — CBC
HCT: 38.2 % (ref 36.0–46.0)
HEMOGLOBIN: 13.2 g/dL (ref 12.0–15.0)
MCH: 29.7 pg (ref 26.0–34.0)
MCHC: 34.6 g/dL (ref 30.0–36.0)
MCV: 85.8 fL (ref 78.0–100.0)
Platelets: 258 10*3/uL (ref 150–400)
RBC: 4.45 MIL/uL (ref 3.87–5.11)
RDW: 13.6 % (ref 11.5–15.5)
WBC: 9.5 10*3/uL (ref 4.0–10.5)

## 2013-07-27 LAB — RENAL FUNCTION PANEL
ALBUMIN: 4.1 g/dL (ref 3.5–5.2)
BUN: 11 mg/dL (ref 6–23)
CHLORIDE: 103 meq/L (ref 96–112)
CO2: 26 meq/L (ref 19–32)
CREATININE: 0.76 mg/dL (ref 0.50–1.10)
Calcium: 9.3 mg/dL (ref 8.4–10.5)
GLUCOSE: 77 mg/dL (ref 70–99)
Phosphorus: 3.7 mg/dL (ref 2.3–4.6)
Potassium: 4.3 mEq/L (ref 3.5–5.3)
Sodium: 139 mEq/L (ref 135–145)

## 2013-07-27 LAB — HEPATIC FUNCTION PANEL
ALBUMIN: 4.1 g/dL (ref 3.5–5.2)
ALK PHOS: 69 U/L (ref 39–117)
ALT: 12 U/L (ref 0–35)
AST: 15 U/L (ref 0–37)
BILIRUBIN INDIRECT: 0.4 mg/dL (ref 0.2–1.2)
Bilirubin, Direct: 0.1 mg/dL (ref 0.0–0.3)
TOTAL PROTEIN: 6.4 g/dL (ref 6.0–8.3)
Total Bilirubin: 0.5 mg/dL (ref 0.2–1.2)

## 2013-07-27 LAB — TSH: TSH: 1.53 u[IU]/mL (ref 0.350–4.500)

## 2013-07-28 LAB — URINALYSIS
Bilirubin Urine: NEGATIVE
Glucose, UA: NEGATIVE mg/dL
HGB URINE DIPSTICK: NEGATIVE
Ketones, ur: NEGATIVE mg/dL
NITRITE: NEGATIVE
PH: 7 (ref 5.0–8.0)
PROTEIN: NEGATIVE mg/dL
Specific Gravity, Urine: 1.01 (ref 1.005–1.030)
Urobilinogen, UA: 0.2 mg/dL (ref 0.0–1.0)

## 2013-08-04 ENCOUNTER — Encounter: Payer: Self-pay | Admitting: Physician Assistant

## 2013-08-04 ENCOUNTER — Ambulatory Visit (INDEPENDENT_AMBULATORY_CARE_PROVIDER_SITE_OTHER): Payer: BC Managed Care – PPO | Admitting: Physician Assistant

## 2013-08-04 VITALS — BP 110/84 | HR 82 | Temp 98.2°F | Resp 16 | Ht 66.0 in | Wt 151.0 lb

## 2013-08-04 DIAGNOSIS — H9201 Otalgia, right ear: Secondary | ICD-10-CM

## 2013-08-04 DIAGNOSIS — H9209 Otalgia, unspecified ear: Secondary | ICD-10-CM

## 2013-08-04 MED ORDER — METHYLPREDNISOLONE ACETATE 40 MG/ML IJ SUSP: 40.0000 mg | Freq: Once | INTRAMUSCULAR | Status: DC

## 2013-08-04 NOTE — Patient Instructions (Signed)
Apply a topical Aspercreme or icy hot to the bothersome area.  Monitor salt intake.  Alternate tylenol and ibuprofen if needed.  The steroid shot given should help with symptoms.  You can take an over-the-counter Antivert if needed for any residual dizziness.  You will be contacted by an ENT office for further evaluation and treatment, as I cannot see a cause for the ear pain on examination today.

## 2013-08-04 NOTE — Addendum Note (Signed)
Addended by: Rockwell Germany on: 08/04/2013 06:38 PM   Modules accepted: Orders

## 2013-08-04 NOTE — Progress Notes (Signed)
Patient presents to clinic today c/o pain of the anterior portion of her right ear that has been present for 2 months, but worse over the past month.  Denies numbness, tingling or rash.  Denies decrease in hearing or tinnitus.  Has had a couple of episodes of vertigo when lying down over the past 2 months.  Past Medical History  Diagnosis Date  . TMJ syndrome   . UTI (urinary tract infection)   . Chicken pox as a child  . Measles as a child  . Mumps as a child  . Preventative health care 02/07/2013  . Hx: UTI (urinary tract infection) 02/07/2013  . Arthritis of right knee 02/07/2013   Current Outpatient Prescriptions on File Prior to Visit  Medication Sig Dispense Refill  . estradiol (ESTRACE) 0.1 MG/GM vaginal cream Use 1/2 g vaginally every night for the first 2 weeks, then use 1/2 g vaginally two or three times per week as needed to maintain symptom relief.  42.5 g  2   No current facility-administered medications on file prior to visit.    Allergies  Allergen Reactions  . Penicillins Rash    Family History  Problem Relation Age of Onset  . Osteoporosis Mother   . Diabetes Mother     type 2  . Cancer Mother 24    stomach  . Colon polyps Mother     benign  . Hypertension Father   . Hyperlipidemia Father   . Other Sister     herniated disk in back, low blood pressure  . Hernia Brother     X 2  . Hypertension Sister   . Depression Sister     History   Social History  . Marital Status: Married    Spouse Name: N/A    Number of Children: N/A  . Years of Education: N/A   Social History Main Topics  . Smoking status: Never Smoker   . Smokeless tobacco: Never Used  . Alcohol Use: 0.5 oz/week    1 drink(s) per week     Comment: occasional wine  . Drug Use: No  . Sexual Activity: Yes    Partners: Male    Birth Control/ Protection: Post-menopausal   Other Topics Concern  . None   Social History Narrative  . None   Review of Systems - See HPI.  All other ROS  are negative.  BP 110/84  Pulse 82  Temp(Src) 98.2 F (36.8 C) (Oral)  Resp 16  Ht 5\' 6"  (1.676 m)  Wt 151 lb (68.493 kg)  BMI 24.38 kg/m2  SpO2 98%  LMP 04/15/2008  Physical Exam  Constitutional: She is oriented to person, place, and time and well-developed, well-nourished, and in no distress.  HENT:  Head: Normocephalic and atraumatic.  Right Ear: External ear normal.  Left Ear: External ear normal.  Nose: Nose normal.  Mouth/Throat: Oropharynx is clear and moist. No oropharyngeal exudate.  TM within normal limits.  No TTP of sinuses noted.  Negative Dix-Hallpike Maneuver.  Eyes: Conjunctivae are normal. Pupils are equal, round, and reactive to light.  Neck: Neck supple.  Cardiovascular: Normal rate, regular rhythm, normal heart sounds and intact distal pulses.   Pulmonary/Chest: Effort normal and breath sounds normal. No respiratory distress. She has no wheezes. She has no rales. She exhibits no tenderness.  Lymphadenopathy:    She has no cervical adenopathy.  Neurological: She is alert and oriented to person, place, and time. She has normal sensation and normal reflexes. No cranial  nerve deficit.     Recent Results (from the past 2160 hour(s))  POCT URINALYSIS DIPSTICK     Status: Normal   Collection Time    05/17/13  9:16 AM      Result Value Ref Range   Color, UA yellow     Clarity, UA clear     Glucose, UA n     Bilirubin, UA n     Ketones, UA n     Spec Grav, UA       Blood, UA n     pH, UA 5.0     Protein, UA n     Urobilinogen, UA negative     Nitrite, UA n     Leukocytes, UA Negative    CBC     Status: None   Collection Time    07/27/13  9:34 AM      Result Value Ref Range   WBC 9.5  4.0 - 10.5 K/uL   RBC 4.45  3.87 - 5.11 MIL/uL   Hemoglobin 13.2  12.0 - 15.0 g/dL   HCT 65.738.2  84.636.0 - 96.246.0 %   MCV 85.8  78.0 - 100.0 fL   MCH 29.7  26.0 - 34.0 pg   MCHC 34.6  30.0 - 36.0 g/dL   RDW 95.213.6  84.111.5 - 32.415.5 %   Platelets 258  150 - 400 K/uL  RENAL  FUNCTION PANEL     Status: None   Collection Time    07/27/13  9:34 AM      Result Value Ref Range   Sodium 139  135 - 145 mEq/L   Potassium 4.3  3.5 - 5.3 mEq/L   Chloride 103  96 - 112 mEq/L   CO2 26  19 - 32 mEq/L   Glucose, Bld 77  70 - 99 mg/dL   BUN 11  6 - 23 mg/dL   Creat 4.010.76  0.270.50 - 2.531.10 mg/dL   Albumin 4.1  3.5 - 5.2 g/dL   Calcium 9.3  8.4 - 66.410.5 mg/dL   Phosphorus 3.7  2.3 - 4.6 mg/dL  TSH     Status: None   Collection Time    07/27/13  9:34 AM      Result Value Ref Range   TSH 1.530  0.350 - 4.500 uIU/mL  HEPATIC FUNCTION PANEL     Status: None   Collection Time    07/27/13  9:34 AM      Result Value Ref Range   Total Bilirubin 0.5  0.2 - 1.2 mg/dL   Bilirubin, Direct 0.1  0.0 - 0.3 mg/dL   Indirect Bilirubin 0.4  0.2 - 1.2 mg/dL   Alkaline Phosphatase 69  39 - 117 U/L   AST 15  0 - 37 U/L   ALT 12  0 - 35 U/L   Total Protein 6.4  6.0 - 8.3 g/dL   Albumin 4.1  3.5 - 5.2 g/dL  LIPID PANEL     Status: Abnormal   Collection Time    07/27/13  9:34 AM      Result Value Ref Range   Cholesterol 212 (*) 0 - 200 mg/dL   Comment: ATP III Classification:           < 200        mg/dL        Desirable          200 - 239     mg/dL  Borderline High          >= 240        mg/dL        High         Triglycerides 99  <150 mg/dL   HDL 64  >39 mg/dL   Total CHOL/HDL Ratio 3.3     VLDL 20  0 - 40 mg/dL   LDL Cholesterol 128 (*) 0 - 99 mg/dL   Comment:       Total Cholesterol/HDL Ratio:CHD Risk                            Coronary Heart Disease Risk Table                                            Men       Women              1/2 Average Risk              3.4        3.3                  Average Risk              5.0        4.4               2X Average Risk              9.6        7.1               3X Average Risk             23.4       11.0     Use the calculated Patient Ratio above and the CHD Risk table      to determine the patient's CHD Risk.     ATP III  Classification (LDL):           < 100        mg/dL         Optimal          100 - 129     mg/dL         Near or Above Optimal          130 - 159     mg/dL         Borderline High          160 - 189     mg/dL         High           > 190        mg/dL         Very High        URINALYSIS     Status: Abnormal   Collection Time    07/27/13  9:34 AM      Result Value Ref Range   Color, Urine YELLOW  YELLOW   APPearance CLEAR  CLEAR   Specific Gravity, Urine 1.010  1.005 - 1.030   pH 7.0  5.0 - 8.0   Glucose, UA NEG  NEG mg/dL   Bilirubin Urine NEG  NEG   Ketones, ur NEG  NEG mg/dL   Hgb urine dipstick NEG  NEG  Protein, ur NEG  NEG mg/dL   Urobilinogen, UA 0.2  0.0 - 1.0 mg/dL   Nitrite NEG  NEG   Leukocytes, UA SMALL (*) NEG    Assessment/Plan: Right ear pain Physical exam unremarkable.  Dix-Hallpike testing negative.  Unclear etiology but question labyrinthitis.  Depo Im given. ENT referral placed.  Supportive measures discussed with patient.

## 2013-08-04 NOTE — Assessment & Plan Note (Signed)
Physical exam unremarkable.  Dix-Hallpike testing negative.  Unclear etiology but question labyrinthitis.  Depo Im given. ENT referral placed.  Supportive measures discussed with patient.

## 2013-08-04 NOTE — Progress Notes (Signed)
Pre visit review using our clinic review tool, if applicable. No additional management support is needed unless otherwise documented below in the visit note/SLS  

## 2014-02-14 ENCOUNTER — Encounter: Payer: Self-pay | Admitting: Physician Assistant

## 2014-05-20 ENCOUNTER — Ambulatory Visit: Payer: BC Managed Care – PPO | Admitting: Obstetrics and Gynecology

## 2014-05-20 ENCOUNTER — Encounter: Payer: Self-pay | Admitting: Obstetrics and Gynecology

## 2014-05-20 ENCOUNTER — Ambulatory Visit (INDEPENDENT_AMBULATORY_CARE_PROVIDER_SITE_OTHER): Payer: Managed Care, Other (non HMO) | Admitting: Obstetrics and Gynecology

## 2014-05-20 VITALS — BP 110/70 | HR 70 | Resp 16 | Ht 66.0 in | Wt 142.2 lb

## 2014-05-20 DIAGNOSIS — Z01419 Encounter for gynecological examination (general) (routine) without abnormal findings: Secondary | ICD-10-CM

## 2014-05-20 DIAGNOSIS — N952 Postmenopausal atrophic vaginitis: Secondary | ICD-10-CM

## 2014-05-20 MED ORDER — ESTRADIOL 0.1 MG/GM VA CREA: TOPICAL_CREAM | VAGINAL | Status: DC

## 2014-05-20 NOTE — Patient Instructions (Signed)

## 2014-05-20 NOTE — Progress Notes (Signed)
Patient ID: Ellen Morrow, female   DOB: 02-17-1956, 59 y.o.   MRN: [id number] 58 y.o. G2P2 MarriedCaucasianF here for annual exam.    Dad passed away in 2013/07/21.  Patient helping mother a lot now.   Took abx for UTI twice in the last year from her own on hand Rx. No urine culture done on each occasion as it was after hours or weekend or on vacation.  Using vaginal cream twice weekly.   PCP: Vivien Rossetti, MD  Patient's last menstrual period was 04/15/2008.          Sexually active: Yes.   female partner The current method of family planning is post menopausal status.    Exercising: Yes.    pilates and running. Doing 5Ks.  Smoker:  no  Health Maintenance: Pap: 05-12-12 wnl:neg HR HPV  History of abnormal Pap:  no MMG: 05-13-14 benign intramammary nodes left breast/nl:[facility]  Colonoscopy:  03/2007 normal with Dr. Sydell Axon Brodie:next colonoscopy due 03/2017. BMD:   11-26-2000 Dr. Georgeanna Lea TDaP:  Up to date with PCP Screening Labs:   Hb today: PCP, Urine today:    reports that she has never smoked. She has never used smokeless tobacco. She reports that she drinks about 0.6 oz of alcohol per week. She reports that she does not use illicit drugs.  Past Medical History  Diagnosis Date  . TMJ syndrome   . UTI (urinary tract infection)   . Chicken pox as a child  . Measles as a child  . Mumps as a child  . Preventative health care 02/07/2013  . Hx: UTI (urinary tract infection) 02/07/2013  . Arthritis of right knee 02/07/2013    Past Surgical History  Procedure Laterality Date  . Scar tissue break up  07/21/04    Dr Alfred Levins  . Wisdom tooth extraction  as a teenager  . Appendectomy  3 rd grade  . Mole removal      benign- mole on right thigh    Current Outpatient Prescriptions  Medication Sig Dispense Refill  . estradiol (ESTRACE) 0.1 MG/GM vaginal cream Use 1/2 g vaginally two or three times per week as needed to maintain symptom relief. 42.5 g 2  . KRILL OIL PO Take 1  tablet by mouth daily.    Marland Kitchen LIDODERM 5 %   2  . magnesium 30 MG tablet Take 30 mg by mouth daily.     No current facility-administered medications for this visit.   Facility-Administered Medications Ordered in Other Visits  Medication Dose Route Frequency Provider Last Rate Last Dose  . methylPREDNISolone acetate (DEPO-MEDROL) injection 40 mg  40 mg Intramuscular Once Brunetta Jeans, PA-C        Family History  Problem Relation Age of Onset  . Osteoporosis Mother   . Diabetes Mother     type 2  . Cancer Mother 80    stomach  . Colon polyps Mother     benign  . Hypertension Father   . Hyperlipidemia Father   . Other Sister     herniated disk in back, low blood pressure  . Hernia Brother     X 2  . Hypertension Sister   . Depression Sister     ROS:  Pertinent items are noted in HPI.  Otherwise, a comprehensive ROS was negative.  Exam:   BP 110/70 mmHg  Pulse 70  Resp 16  Ht 5\' 6"  (1.676 m)  Wt 142 lb 3.2 oz (64.501 kg)  BMI  22.96 kg/m2  LMP 04/15/2008     Height: 5\' 6"  (167.6 cm)  Ht Readings from Last 3 Encounters:  05/20/14 5\' 6"  (1.676 m)  08/04/13 5\' 6"  (1.676 m)  06/14/13 5\' 6"  (1.676 m)    General appearance: alert, cooperative and appears stated age Head: Normocephalic, without obvious abnormality, atraumatic Neck: no adenopathy, supple, symmetrical, trachea midline and thyroid normal to inspection and palpation Lungs: clear to auscultation bilaterally Breasts: normal appearance, no masses or tenderness, Inspection negative, No nipple retraction or dimpling, No nipple discharge or bleeding, No axillary or supraclavicular adenopathy Heart: regular rate and rhythm Abdomen: soft, non-tender; bowel sounds normal; no masses,  no organomegaly Extremities: extremities normal, atraumatic, no cyanosis or edema Skin: Skin color, texture, turgor normal. No rashes or lesions Lymph nodes: Cervical, supraclavicular, and axillary nodes normal. No abnormal inguinal  nodes palpated Neurologic: Grossly normal   Pelvic: External genitalia:  no lesions              Urethra:  normal appearing urethra with no masses, tenderness or lesions              Bartholins and Skenes: normal                 Vagina: normal appearing vagina with normal color and discharge, no lesions              Cervix: no lesions              Pap taken: No. Bimanual Exam:  Uterus:  normal size, contour, position, consistency, mobility, non-tender              Adnexa: normal adnexa and no mass, fullness, tenderness               Rectovaginal: Confirms               Anus:  normal sphincter tone, no lesions  Chaperone was present for exam.  A:  Well Woman with normal exam Recurrent UTIs improved on vaginal estrogen cream.   P:   Mammogram yearly.  pap smear not indicated.  Estrace vaginal cream 1/2 gram pv at hs twice weekly.  Discussed benefits and risks of breast cancer, MI, stroke, DVT, PE. Call for dysuria symptoms so a urine culture can be ordered.  Continue weight bearing exercise.  return annually or prn

## 2014-06-07 ENCOUNTER — Ambulatory Visit (INDEPENDENT_AMBULATORY_CARE_PROVIDER_SITE_OTHER): Payer: Managed Care, Other (non HMO) | Admitting: Medical

## 2014-06-07 ENCOUNTER — Encounter: Payer: Self-pay | Admitting: Medical

## 2014-06-07 VITALS — BP 119/76 | HR 80 | Temp 98.4°F | Ht 66.0 in | Wt 139.6 lb

## 2014-06-07 DIAGNOSIS — R519 Headache, unspecified: Secondary | ICD-10-CM | POA: Insufficient documentation

## 2014-06-07 DIAGNOSIS — H6503 Acute serous otitis media, bilateral: Secondary | ICD-10-CM

## 2014-06-07 DIAGNOSIS — R51 Headache: Secondary | ICD-10-CM

## 2014-06-07 DIAGNOSIS — J069 Acute upper respiratory infection, unspecified: Secondary | ICD-10-CM | POA: Diagnosis not present

## 2014-06-07 HISTORY — DX: Headache, unspecified: R51.9

## 2014-06-07 MED ORDER — AZITHROMYCIN 250 MG PO TABS: ORAL_TABLET | ORAL | Status: DC

## 2014-06-07 MED ORDER — BENZONATATE 100 MG PO CAPS: 100.0000 mg | ORAL_CAPSULE | Freq: Three times a day (TID) | ORAL | Status: DC | PRN

## 2014-06-07 MED ORDER — FLUTICASONE PROPIONATE 50 MCG/ACT NA SUSP: 2.0000 | Freq: Every day | NASAL | Status: DC

## 2014-06-07 NOTE — Progress Notes (Signed)
Pre visit review using our clinic review tool, if applicable. No additional management support is needed unless otherwise documented below in the visit note. 

## 2014-06-07 NOTE — Assessment & Plan Note (Signed)
Your ears both look infected following uri type illness. In addition your productive cough causes concern for bronchtitis.  I am prescribing flonase for the nasal congestion. Benzonatate for cough and azithromycin antibiotic.  Follow up in 7 days or as needed.

## 2014-06-07 NOTE — Assessment & Plan Note (Signed)
rx flonase for nasal congestion and benzonatate for cough.

## 2014-06-07 NOTE — Patient Instructions (Addendum)
Otitis media of both ears Your ears both look infected following uri type illness. In addition your productive cough causes concern for bronchtitis.  I am prescribing flonase for the nasal congestion. Benzonatate for cough and azithromycin antibiotic.  Follow up in 7 days or as needed.   Acute upper respiratory infection rx flonase for nasal congestion and benzonatate for cough.

## 2014-06-07 NOTE — Progress Notes (Signed)
   Subjective:    Patient ID: Ellen Morrow, female    DOB: 1956-02-22, 59 y.o.   MRN: [id number]  HPI   Pt on Thursday she felt nasal congestion with some sinus pressure. Some cough and feels pnd. Coughing up thick yellow mucous.  Over weekend she felt chills. She is not a smoker.  No diffuse myalgias.   Review of Systems  Constitutional: Positive for chills. Negative for fever and fatigue.  HENT: Positive for congestion, ear pain and sinus pressure. Negative for sore throat.   Respiratory: Positive for cough. Negative for chest tightness.   Cardiovascular: Negative for chest pain and palpitations.  Neurological: Negative for dizziness, syncope, facial asymmetry, speech difficulty, weakness, numbness and headaches.  Hematological: Positive for adenopathy.  Psychiatric/Behavioral: Negative for behavioral problems, confusion and agitation.       Objective:   Physical Exam  General  Mental Status - Alert. General Appearance - Well groomed. Not in acute distress.  Skin Rashes- No Rashes.  HEENT Head- Normal. Ear Auditory Canal - Left- Normal. Right - red.Tympanic Membrane- Left- red. Right- red Eye Sclera/Conjunctiva- Left- Normal. Right- Normal. Nose & Sinuses Nasal Mucosa- Left-  Boggy + Congested. Right-  Boggy + Congested. Mouth & Throat Lips: Upper Lip- Normal: no dryness, cracking, pallor, cyanosis, or vesicular eruption. Lower Lip-Normal: no dryness, cracking, pallor, cyanosis or vesicular eruption. Buccal Mucosa- Bilateral- No Aphthous ulcers. Oropharynx- No Discharge or Erythema. Tonsils: Characteristics- Bilateral- Moderate  Erythema or Congestion. Size/Enlargement- Bilateral- No enlargement. Discharge- bilateral-None.  Neck Neck- Supple. No Masses. Mild swollen submandibular nodes.   Chest and Lung Exam Auscultation: Breath Sounds:- even and unlabored,  Cardiovascular Auscultation:Rythm- Regular, rate and rhythm. Murmurs & Other Heart Sounds:Ausculatation  of the heart reveal- No Murmurs.  Lymphatic Head & Neck General Head & Neck Lymphatics: Bilateral: Description- No Localized lymphadenopathy.       Assessment & Plan:

## 2014-06-24 ENCOUNTER — Telehealth: Payer: Self-pay | Admitting: Family Medicine

## 2014-06-24 NOTE — Telephone Encounter (Signed)
I check her schedule and the earliest I can see a 30 min. Opening could be on 08/12/14 the 7:45 and 8:00.

## 2014-06-24 NOTE — Telephone Encounter (Signed)
Caller name: Ellen Morrow, Ellen Morrow Relation to pt: self  Call back [phone]   Reason for call:  Pt states her CPE has been rsc twice and would like to know if she can be seen sooner for her CPE. Please advise

## 2014-06-24 NOTE — Telephone Encounter (Signed)
cpe appointment scheduled for this date/time

## 2014-07-21 ENCOUNTER — Telehealth: Payer: Self-pay | Admitting: Family Medicine

## 2014-07-21 NOTE — Telephone Encounter (Signed)
Pre Visit letter sent  °

## 2014-08-10 ENCOUNTER — Telehealth: Payer: Self-pay

## 2014-08-10 NOTE — Telephone Encounter (Signed)
Pre visit call completed 

## 2014-08-10 NOTE — Telephone Encounter (Signed)
LMOVM @[id number] 

## 2014-08-11 ENCOUNTER — Ambulatory Visit: Payer: Managed Care, Other (non HMO) | Admitting: Family Medicine

## 2014-08-12 ENCOUNTER — Encounter: Payer: Self-pay | Admitting: Family Medicine

## 2014-08-12 ENCOUNTER — Ambulatory Visit (INDEPENDENT_AMBULATORY_CARE_PROVIDER_SITE_OTHER): Payer: Managed Care, Other (non HMO) | Admitting: Family Medicine

## 2014-08-12 VITALS — BP 102/62 | HR 70 | Temp 97.6°F | Ht 66.0 in | Wt 140.0 lb

## 2014-08-12 DIAGNOSIS — R739 Hyperglycemia, unspecified: Secondary | ICD-10-CM

## 2014-08-12 DIAGNOSIS — E782 Mixed hyperlipidemia: Secondary | ICD-10-CM

## 2014-08-12 DIAGNOSIS — Z0000 Encounter for general adult medical examination without abnormal findings: Secondary | ICD-10-CM

## 2014-08-12 DIAGNOSIS — M170 Bilateral primary osteoarthritis of knee: Secondary | ICD-10-CM

## 2014-08-12 DIAGNOSIS — H6503 Acute serous otitis media, bilateral: Secondary | ICD-10-CM

## 2014-08-12 LAB — LIPID PANEL
Cholesterol: 205 mg/dL — ABNORMAL HIGH (ref 0–200)
HDL: 77.1 mg/dL (ref >39.00)
LDL CALC: 110 mg/dL — AB (ref 0–99)
NonHDL: 127.9
Total CHOL/HDL Ratio: 3
Triglycerides: 90 mg/dL (ref 0.0–149.0)
VLDL: 18 mg/dL (ref 0.0–40.0)

## 2014-08-12 LAB — RENAL FUNCTION PANEL
Albumin: 4.5 g/dL (ref 3.5–5.2)
BUN: 13 mg/dL (ref 6–23)
CO2: 26 meq/L (ref 19–32)
Calcium: 9.6 mg/dL (ref 8.4–10.5)
Chloride: 104 mEq/L (ref 96–112)
Creatinine, Ser: 0.72 mg/dL (ref 0.40–1.20)
GFR: 88.5 mL/min (ref >60.00)
Glucose, Bld: 85 mg/dL (ref 70–99)
POTASSIUM: 4 meq/L (ref 3.5–5.1)
Phosphorus: 3.1 mg/dL (ref 2.3–4.6)
SODIUM: 139 meq/L (ref 135–145)

## 2014-08-12 LAB — CBC
HCT: 41.4 % (ref 36.0–46.0)
Hemoglobin: 13.9 g/dL (ref 12.0–15.0)
MCHC: 33.6 g/dL (ref 30.0–36.0)
MCV: 87.4 fl (ref 78.0–100.0)
PLATELETS: 258 10*3/uL (ref 150.0–400.0)
RBC: 4.74 Mil/uL (ref 3.87–5.11)
RDW: 13.6 % (ref 11.5–15.5)
WBC: 5.8 10*3/uL (ref 4.0–10.5)

## 2014-08-12 LAB — HEPATIC FUNCTION PANEL
ALT: 14 U/L (ref 0–35)
AST: 16 U/L (ref 0–37)
Albumin: 4.5 g/dL (ref 3.5–5.2)
Alkaline Phosphatase: 75 U/L (ref 39–117)
BILIRUBIN DIRECT: 0.1 mg/dL (ref 0.0–0.3)
Total Bilirubin: 0.5 mg/dL (ref 0.2–1.2)
Total Protein: 6.9 g/dL (ref 6.0–8.3)

## 2014-08-12 LAB — TSH: TSH: 1.19 u[IU]/mL (ref 0.35–4.50)

## 2014-08-12 NOTE — Assessment & Plan Note (Signed)
Patient encouraged to maintain heart healthy diet, regular exercise, adequate sleep. Consider daily probiotics. Take medications as prescribed 

## 2014-08-12 NOTE — Progress Notes (Signed)
Pre visit review using our clinic review tool, if applicable. No additional management support is needed unless otherwise documented below in the visit note. 

## 2014-08-12 NOTE — Patient Instructions (Addendum)
MegaRed caps 1 daily, by Schiff  Avoid partially hydrogenated oils  Salon Pas gel or patches as needeed for pain Consider Curcumen/Turmeric caps daily. Follow instructions on bottle  Like the [place], I order at [email or web address]  64 oz of clear fluids  Cologuard   Preventive Care for Adults A healthy lifestyle and preventive care can promote health and wellness. Preventive health guidelines for women include the following key practices.  A routine yearly physical is a good way to check with your health care provider about your health and preventive screening. It is a chance to share any concerns and updates on your health and to receive a thorough exam.  Visit your dentist for a routine exam and preventive care every 6 months. Brush your teeth twice a day and floss once a day. Good oral hygiene prevents tooth decay and gum disease.  The frequency of eye exams is based on your age, health, family medical history, use of contact lenses, and other factors. Follow your health care provider's recommendations for frequency of eye exams.  Eat a healthy diet. Foods like vegetables, fruits, whole grains, low-fat dairy products, and lean protein foods contain the nutrients you need without too many calories. Decrease your intake of foods high in solid fats, added sugars, and salt. Eat the right amount of calories for you.Get information about a proper diet from your health care provider, if necessary.  Regular physical exercise is one of the most important things you can do for your health. Most adults should get at least 150 minutes of moderate-intensity exercise (any activity that increases your heart rate and causes you to sweat) each week. In addition, most adults need muscle-strengthening exercises on 2 or more days a week.  Maintain a healthy weight. The body mass index (BMI) is a screening tool to identify possible weight problems. It provides an estimate of body fat based on height and  weight. Your health care provider can find your BMI and can help you achieve or maintain a healthy weight.For adults 20 years and older:  A BMI below 18.5 is considered underweight.  A BMI of 18.5 to 24.9 is normal.  A BMI of 25 to 29.9 is considered overweight.  A BMI of 30 and above is considered obese.  Maintain normal blood lipids and cholesterol levels by exercising and minimizing your intake of saturated fat. Eat a balanced diet with plenty of fruit and vegetables. Blood tests for lipids and cholesterol should begin at age 40 and be repeated every 5 years. If your lipid or cholesterol levels are high, you are over 50, or you are at high risk for heart disease, you may need your cholesterol levels checked more frequently.Ongoing high lipid and cholesterol levels should be treated with medicines if diet and exercise are not working.  If you smoke, find out from your health care provider how to quit. If you do not use tobacco, do not start.  Lung cancer screening is recommended for adults aged 74-80 years who are at high risk for developing lung cancer because of a history of smoking. A yearly low-dose CT scan of the lungs is recommended for people who have at least a 30-pack-year history of smoking and are a current smoker or have quit within the past 15 years. A pack year of smoking is smoking an average of 1 pack of cigarettes a day for 1 year (for example: 1 pack a day for 30 years or 2 packs a day for 15 years). Yearly  screening should continue until the smoker has stopped smoking for at least 15 years. Yearly screening should be stopped for people who develop a health problem that would prevent them from having lung cancer treatment.  If you are pregnant, do not drink alcohol. If you are breastfeeding, be very cautious about drinking alcohol. If you are not pregnant and choose to drink alcohol, do not have more than 1 drink per day. One drink is considered to be 12 ounces (355 mL) of beer,  5 ounces (148 mL) of wine, or 1.5 ounces (44 mL) of liquor.  Avoid use of street drugs. Do not share needles with anyone. Ask for help if you need support or instructions about stopping the use of drugs.  High blood pressure causes heart disease and increases the risk of stroke. Your blood pressure should be checked at least every 1 to 2 years. Ongoing high blood pressure should be treated with medicines if weight loss and exercise do not work.  If you are 62-33 years old, ask your health care provider if you should take aspirin to prevent strokes.  Diabetes screening involves taking a blood sample to check your fasting blood sugar level. This should be done once every 3 years, after age 84, if you are within normal weight and without risk factors for diabetes. Testing should be considered at a younger age or be carried out more frequently if you are overweight and have at least 1 risk factor for diabetes.  Breast cancer screening is essential preventive care for women. You should practice "breast self-awareness." This means understanding the normal appearance and feel of your breasts and may include breast self-examination. Any changes detected, no matter how small, should be reported to a health care provider. Women in their 86s and 30s should have a clinical breast exam (CBE) by a health care provider as part of a regular health exam every 1 to 3 years. After age 55, women should have a CBE every year. Starting at age 73, women should consider having a mammogram (breast X-ray test) every year. Women who have a family history of breast cancer should talk to their health care provider about genetic screening. Women at a high risk of breast cancer should talk to their health care providers about having an MRI and a mammogram every year.  Breast cancer gene (BRCA)-related cancer risk assessment is recommended for women who have family members with BRCA-related cancers. BRCA-related cancers include breast,  ovarian, tubal, and peritoneal cancers. Having family members with these cancers may be associated with an increased risk for harmful changes (mutations) in the breast cancer genes BRCA1 and BRCA2. Results of the assessment will determine the need for genetic counseling and BRCA1 and BRCA2 testing.  Routine pelvic exams to screen for cancer are no longer recommended for nonpregnant women who are considered low risk for cancer of the pelvic organs (ovaries, uterus, and vagina) and who do not have symptoms. Ask your health care provider if a screening pelvic exam is right for you.  If you have had past treatment for cervical cancer or a condition that could lead to cancer, you need Pap tests and screening for cancer for at least 20 years after your treatment. If Pap tests have been discontinued, your risk factors (such as having a new sexual partner) need to be reassessed to determine if screening should be resumed. Some women have medical problems that increase the chance of getting cervical cancer. In these cases, your health care provider may  recommend more frequent screening and Pap tests.  The HPV test is an additional test that may be used for cervical cancer screening. The HPV test looks for the virus that can cause the cell changes on the cervix. The cells collected during the Pap test can be tested for HPV. The HPV test could be used to screen women aged 16 years and older, and should be used in women of any age who have unclear Pap test results. After the age of 54, women should have HPV testing at the same frequency as a Pap test.  Colorectal cancer can be detected and often prevented. Most routine colorectal cancer screening begins at the age of 14 years and continues through age 71 years. However, your health care provider may recommend screening at an earlier age if you have risk factors for colon cancer. On a yearly basis, your health care provider may provide home test kits to check for hidden  blood in the stool. Use of a small camera at the end of a tube, to directly examine the colon (sigmoidoscopy or colonoscopy), can detect the earliest forms of colorectal cancer. Talk to your health care provider about this at age 26, when routine screening begins. Direct exam of the colon should be repeated every 5-10 years through age 19 years, unless early forms of pre-cancerous polyps or small growths are found.  People who are at an increased risk for hepatitis B should be screened for this virus. You are considered at high risk for hepatitis B if:  You were born in a country where hepatitis B occurs often. Talk with your health care provider about which countries are considered high risk.  Your parents were born in a high-risk country and you have not received a shot to protect against hepatitis B (hepatitis B vaccine).  You have HIV or AIDS.  You use needles to inject street drugs.  You live with, or have sex with, someone who has hepatitis B.  You get hemodialysis treatment.  You take certain medicines for conditions like cancer, organ transplantation, and autoimmune conditions.  Hepatitis C blood testing is recommended for all people born from [birth year] through 1965 and any individual with known risks for hepatitis C.  Practice safe sex. Use condoms and avoid high-risk sexual practices to reduce the spread of sexually transmitted infections (STIs). STIs include gonorrhea, chlamydia, syphilis, trichomonas, herpes, HPV, and human immunodeficiency virus (HIV). Herpes, HIV, and HPV are viral illnesses that have no cure. They can result in disability, cancer, and death.  You should be screened for sexually transmitted illnesses (STIs) including gonorrhea and chlamydia if:  You are sexually active and are younger than 24 years.  You are older than 24 years and your health care provider tells you that you are at risk for this type of infection.  Your sexual activity has changed since you  were last screened and you are at an increased risk for chlamydia or gonorrhea. Ask your health care provider if you are at risk.  If you are at risk of being infected with HIV, it is recommended that you take a prescription medicine daily to prevent HIV infection. This is called preexposure prophylaxis (PrEP). You are considered at risk if:  You are a heterosexual woman, are sexually active, and are at increased risk for HIV infection.  You take drugs by injection.  You are sexually active with a partner who has HIV.  Talk with your health care provider about whether you are at high  risk of being infected with HIV. If you choose to begin PrEP, you should first be tested for HIV. You should then be tested every 3 months for as long as you are taking PrEP.  Osteoporosis is a disease in which the bones lose minerals and strength with aging. This can result in serious bone fractures or breaks. The risk of osteoporosis can be identified using a bone density scan. Women ages 14 years and over and women at risk for fractures or osteoporosis should discuss screening with their health care providers. Ask your health care provider whether you should take a calcium supplement or vitamin D to reduce the rate of osteoporosis.  Menopause can be associated with physical symptoms and risks. Hormone replacement therapy is available to decrease symptoms and risks. You should talk to your health care provider about whether hormone replacement therapy is right for you.  Use sunscreen. Apply sunscreen liberally and repeatedly throughout the day. You should seek shade when your shadow is shorter than you. Protect yourself by wearing long sleeves, pants, a wide-brimmed hat, and sunglasses year round, whenever you are outdoors.  Once a month, do a whole body skin exam, using a mirror to look at the skin on your back. Tell your health care provider of new moles, moles that have irregular borders, moles that are larger  than a pencil eraser, or moles that have changed in shape or color.  Stay current with required vaccines (immunizations).  Influenza vaccine. All adults should be immunized every year.  Tetanus, diphtheria, and acellular pertussis (Td, Tdap) vaccine. Pregnant women should receive 1 dose of Tdap vaccine during each pregnancy. The dose should be obtained regardless of the length of time since the last dose. Immunization is preferred during the 27th-36th week of gestation. An adult who has not previously received Tdap or who does not know her vaccine status should receive 1 dose of Tdap. This initial dose should be followed by tetanus and diphtheria toxoids (Td) booster doses every 10 years. Adults with an unknown or incomplete history of completing a 3-dose immunization series with Td-containing vaccines should begin or complete a primary immunization series including a Tdap dose. Adults should receive a Td booster every 10 years.  Varicella vaccine. An adult without evidence of immunity to varicella should receive 2 doses or a second dose if she has previously received 1 dose. Pregnant females who do not have evidence of immunity should receive the first dose after pregnancy. This first dose should be obtained before leaving the health care facility. The second dose should be obtained 4-8 weeks after the first dose.  Human papillomavirus (HPV) vaccine. Females aged 13-26 years who have not received the vaccine previously should obtain the 3-dose series. The vaccine is not recommended for use in pregnant females. However, pregnancy testing is not needed before receiving a dose. If a female is found to be pregnant after receiving a dose, no treatment is needed. In that case, the remaining doses should be delayed until after the pregnancy. Immunization is recommended for any person with an immunocompromised condition through the age of 61 years if she did not get any or all doses earlier. During the 3-dose  series, the second dose should be obtained 4-8 weeks after the first dose. The third dose should be obtained 24 weeks after the first dose and 16 weeks after the second dose.  Zoster vaccine. One dose is recommended for adults aged 56 years or older unless certain conditions are present.  Measles,  mumps, and rubella (MMR) vaccine. Adults born before [birth year] generally are considered immune to measles and mumps. Adults born in [birth year] or later should have 1 or more doses of MMR vaccine unless there is a contraindication to the vaccine or there is laboratory evidence of immunity to each of the three diseases. A routine second dose of MMR vaccine should be obtained at least 28 days after the first dose for students attending postsecondary schools, health care workers, or international travelers. People who received inactivated measles vaccine or an unknown type of measles vaccine during 1963-1967 should receive 2 doses of MMR vaccine. People who received inactivated mumps vaccine or an unknown type of mumps vaccine before 1979 and are at high risk for mumps infection should consider immunization with 2 doses of MMR vaccine. For females of childbearing age, rubella immunity should be determined. If there is no evidence of immunity, females who are not pregnant should be vaccinated. If there is no evidence of immunity, females who are pregnant should delay immunization until after pregnancy. Unvaccinated health care workers born before [birth year] who lack laboratory evidence of measles, mumps, or rubella immunity or laboratory confirmation of disease should consider measles and mumps immunization with 2 doses of MMR vaccine or rubella immunization with 1 dose of MMR vaccine.  Pneumococcal 13-valent conjugate (PCV13) vaccine. When indicated, a person who is uncertain of her immunization history and has no record of immunization should receive the PCV13 vaccine. An adult aged 79 years or older who has certain medical conditions  and has not been previously immunized should receive 1 dose of PCV13 vaccine. This PCV13 should be followed with a dose of pneumococcal polysaccharide (PPSV23) vaccine. The PPSV23 vaccine dose should be obtained at least 8 weeks after the dose of PCV13 vaccine. An adult aged 23 years or older who has certain medical conditions and previously received 1 or more doses of PPSV23 vaccine should receive 1 dose of PCV13. The PCV13 vaccine dose should be obtained 1 or more years after the last PPSV23 vaccine dose.  Pneumococcal polysaccharide (PPSV23) vaccine. When PCV13 is also indicated, PCV13 should be obtained first. All adults aged 63 years and older should be immunized. An adult younger than age 83 years who has certain medical conditions should be immunized. Any person who resides in a nursing home or long-term care facility should be immunized. An adult smoker should be immunized. People with an immunocompromised condition and certain other conditions should receive both PCV13 and PPSV23 vaccines. People with human immunodeficiency virus (HIV) infection should be immunized as soon as possible after diagnosis. Immunization during chemotherapy or radiation therapy should be avoided. Routine use of PPSV23 vaccine is not recommended for American Indians, [place] Natives, or people younger than 65 years unless there are medical conditions that require PPSV23 vaccine. When indicated, people who have unknown immunization and have no record of immunization should receive PPSV23 vaccine. One-time revaccination 5 years after the first dose of PPSV23 is recommended for people aged 19-64 years who have chronic kidney failure, nephrotic syndrome, asplenia, or immunocompromised conditions. People who received 1-2 doses of PPSV23 before age 30 years should receive another dose of PPSV23 vaccine at age 90 years or later if at least 5 years have passed since the previous dose. Doses of PPSV23 are not needed for people immunized  with PPSV23 at or after age 6 years.  Meningococcal vaccine. Adults with asplenia or persistent complement component deficiencies should receive 2 doses of quadrivalent meningococcal conjugate (MenACWY-D) vaccine. The  doses should be obtained at least 2 months apart. Microbiologists working with certain meningococcal bacteria, [employer], people at risk during an outbreak, and people who travel to or live in countries with a high rate of meningitis should be immunized. A first-year college student up through age 77 years who is living in a residence hall should receive a dose if she did not receive a dose on or after her 16th birthday. Adults who have certain high-risk conditions should receive one or more doses of vaccine.  Hepatitis A vaccine. Adults who wish to be protected from this disease, have certain high-risk conditions, work with hepatitis A-infected animals, work in hepatitis A research labs, or travel to or work in countries with a high rate of hepatitis A should be immunized. Adults who were previously unvaccinated and who anticipate close contact with an international adoptee during the first 60 days after arrival in the [place] from a country with a high rate of hepatitis A should be immunized.  Hepatitis B vaccine. Adults who wish to be protected from this disease, have certain high-risk conditions, may be exposed to blood or other infectious body fluids, are household contacts or sex partners of hepatitis B positive people, are clients or workers in certain care facilities, or travel to or work in countries with a high rate of hepatitis B should be immunized.  Haemophilus influenzae type b (Hib) vaccine. A previously unvaccinated person with asplenia or sickle cell disease or having a scheduled splenectomy should receive 1 dose of Hib vaccine. Regardless of previous immunization, a recipient of a hematopoietic stem cell transplant should receive a 3-dose series 6-12 months  after her successful transplant. Hib vaccine is not recommended for adults with HIV infection. Preventive Services / Frequency Ages 89 to 83 years  Blood pressure check.** / Every 1 to 2 years.  Lipid and cholesterol check.** / Every 5 years beginning at age 70.  Clinical breast exam.** / Every 3 years for women in their 75s and 23s.  BRCA-related cancer risk assessment.** / For women who have family members with a BRCA-related cancer (breast, ovarian, tubal, or peritoneal cancers).  Pap test.** / Every 2 years from ages 42 through 50. Every 3 years starting at age 109 through age 18 or 58 with a history of 3 consecutive normal Pap tests.  HPV screening.** / Every 3 years from ages 33 through ages 43 to 51 with a history of 3 consecutive normal Pap tests.  Hepatitis C blood test.** / For any individual with known risks for hepatitis C.  Skin self-exam. / Monthly.  Influenza vaccine. / Every year.  Tetanus, diphtheria, and acellular pertussis (Tdap, Td) vaccine.** / Consult your health care provider. Pregnant women should receive 1 dose of Tdap vaccine during each pregnancy. 1 dose of Td every 10 years.  Varicella vaccine.** / Consult your health care provider. Pregnant females who do not have evidence of immunity should receive the first dose after pregnancy.  HPV vaccine. / 3 doses over 6 months, if 69 and younger. The vaccine is not recommended for use in pregnant females. However, pregnancy testing is not needed before receiving a dose.  Measles, mumps, rubella (MMR) vaccine.** / You need at least 1 dose of MMR if you were born in 1957 or later. You may also need a 2nd dose. For females of childbearing age, rubella immunity should be determined. If there is no evidence of immunity, females who are not pregnant should be vaccinated. If there is no  evidence of immunity, females who are pregnant should delay immunization until after pregnancy.  Pneumococcal 13-valent conjugate (PCV13)  vaccine.** / Consult your health care provider.  Pneumococcal polysaccharide (PPSV23) vaccine.** / 1 to 2 doses if you smoke cigarettes or if you have certain conditions.  Meningococcal vaccine.** / 1 dose if you are age 42 to 73 years and a [employer] researcher living in a residence hall, or have one of several medical conditions, you need to get vaccinated against meningococcal disease. You may also need additional booster doses.  Hepatitis A vaccine.** / Consult your health care provider.  Hepatitis B vaccine.** / Consult your health care provider.  Haemophilus influenzae type b (Hib) vaccine.** / Consult your health care provider. Ages 10 to 39 years  Blood pressure check.** / Every 1 to 2 years.  Lipid and cholesterol check.** / Every 5 years beginning at age 23 years.  Lung cancer screening. / Every year if you are aged 81-80 years and have a 30-pack-year history of smoking and currently smoke or have quit within the past 15 years. Yearly screening is stopped once you have quit smoking for at least 15 years or develop a health problem that would prevent you from having lung cancer treatment.  Clinical breast exam.** / Every year after age 63 years.  BRCA-related cancer risk assessment.** / For women who have family members with a BRCA-related cancer (breast, ovarian, tubal, or peritoneal cancers).  Mammogram.** / Every year beginning at age 65 years and continuing for as long as you are in good health. Consult with your health care provider.  Pap test.** / Every 3 years starting at age 52 years through age 3 or 7 years with a history of 3 consecutive normal Pap tests.  HPV screening.** / Every 3 years from ages 15 years through ages 83 to 109 years with a history of 3 consecutive normal Pap tests.  Fecal occult blood test (FOBT) of stool. / Every year beginning at age 99 years and continuing until age 76 years. You may not need to do this test if you get a colonoscopy every  10 years.  Flexible sigmoidoscopy or colonoscopy.** / Every 5 years for a flexible sigmoidoscopy or every 10 years for a colonoscopy beginning at age 65 years and continuing until age 51 years.  Hepatitis C blood test.** / For all people born from [birth year] through 1964 and any individual with known risks for hepatitis C.  Skin self-exam. / Monthly.  Influenza vaccine. / Every year.  Tetanus, diphtheria, and acellular pertussis (Tdap/Td) vaccine.** / Consult your health care provider. Pregnant women should receive 1 dose of Tdap vaccine during each pregnancy. 1 dose of Td every 10 years.  Varicella vaccine.** / Consult your health care provider. Pregnant females who do not have evidence of immunity should receive the first dose after pregnancy.  Zoster vaccine.** / 1 dose for adults aged 63 years or older.  Measles, mumps, rubella (MMR) vaccine.** / You need at least 1 dose of MMR if you were born in 1957 or later. You may also need a 2nd dose. For females of childbearing age, rubella immunity should be determined. If there is no evidence of immunity, females who are not pregnant should be vaccinated. If there is no evidence of immunity, females who are pregnant should delay immunization until after pregnancy.  Pneumococcal 13-valent conjugate (PCV13) vaccine.** / Consult your health care provider.  Pneumococcal polysaccharide (PPSV23) vaccine.** / 1 to 2 doses if you smoke cigarettes or  if you have certain conditions.  Meningococcal vaccine.** / Consult your health care provider.  Hepatitis A vaccine.** / Consult your health care provider.  Hepatitis B vaccine.** / Consult your health care provider.  Haemophilus influenzae type b (Hib) vaccine.** / Consult your health care provider. Ages 54 years and over  Blood pressure check.** / Every 1 to 2 years.  Lipid and cholesterol check.** / Every 5 years beginning at age 13 years.  Lung cancer screening. / Every year if you are aged 30-80  years and have a 30-pack-year history of smoking and currently smoke or have quit within the past 15 years. Yearly screening is stopped once you have quit smoking for at least 15 years or develop a health problem that would prevent you from having lung cancer treatment.  Clinical breast exam.** / Every year after age 16 years.  BRCA-related cancer risk assessment.** / For women who have family members with a BRCA-related cancer (breast, ovarian, tubal, or peritoneal cancers).  Mammogram.** / Every year beginning at age 37 years and continuing for as long as you are in good health. Consult with your health care provider.  Pap test.** / Every 3 years starting at age 85 years through age 65 or 74 years with 3 consecutive normal Pap tests. Testing can be stopped between 65 and 70 years with 3 consecutive normal Pap tests and no abnormal Pap or HPV tests in the past 10 years.  HPV screening.** / Every 3 years from ages 37 years through ages 15 or 105 years with a history of 3 consecutive normal Pap tests. Testing can be stopped between 65 and 70 years with 3 consecutive normal Pap tests and no abnormal Pap or HPV tests in the past 10 years.  Fecal occult blood test (FOBT) of stool. / Every year beginning at age 55 years and continuing until age 47 years. You may not need to do this test if you get a colonoscopy every 10 years.  Flexible sigmoidoscopy or colonoscopy.** / Every 5 years for a flexible sigmoidoscopy or every 10 years for a colonoscopy beginning at age 30 years and continuing until age 56 years.  Hepatitis C blood test.** / For all people born from [birth year] through 1964 and any individual with known risks for hepatitis C.  Osteoporosis screening.** / A one-time screening for women ages 3 years and over and women at risk for fractures or osteoporosis.  Skin self-exam. / Monthly.  Influenza vaccine. / Every year.  Tetanus, diphtheria, and acellular pertussis (Tdap/Td) vaccine.** / 1 dose of  Td every 10 years.  Varicella vaccine.** / Consult your health care provider.  Zoster vaccine.** / 1 dose for adults aged 90 years or older.  Pneumococcal 13-valent conjugate (PCV13) vaccine.** / Consult your health care provider.  Pneumococcal polysaccharide (PPSV23) vaccine.** / 1 dose for all adults aged 87 years and older.  Meningococcal vaccine.** / Consult your health care provider.  Hepatitis A vaccine.** / Consult your health care provider.  Hepatitis B vaccine.** / Consult your health care provider.  Haemophilus influenzae type b (Hib) vaccine.** / Consult your health care provider. ** Family history and personal history of risk and conditions may change your health care provider's recommendations. Document Released: 05/28/2001 Document Revised: 08/16/2013 Document Reviewed: 08/27/2010 [facility] Patient Information 2015 [place], [place]. This information is not intended to replace advice given to you by your health care provider. Make sure you discuss any questions you have with your health care provider.

## 2014-08-12 NOTE — Assessment & Plan Note (Signed)
Encouraged to keep running keep Advil down to2 tabs twice daily with food, consider Salon Pas prn and Curcumen daily, report if worse

## 2014-08-12 NOTE — Progress Notes (Signed)
Patient ID: Ellen Morrow, female   DOB: [birth year], 59 y.o.   MRN: [id number]   TRISTIAN BOUSKA  [id number] [birth year] 08/12/2014      Progress Note-Follow Up  Subjective  Chief Complaint  Chief Complaint  Patient presents with  . Annual Exam    HPI  Patient is a 59 y.o. female in today for routine medical care. Patient is in today for annual exam. Overall doing well. Illness. Is exercusubg regularly recently. Generally feels well. Has managed to lose about 12 pounds with diet and exercise. Has also noted some mild trouble with her knees and some episodes of lightheadedness standing up quickly with her weight loss. Denies CP/palp/SOB/HA/congestion/fevers/GI or GU c/o. Taking meds as prescribed  Past Medical History  Diagnosis Date  . TMJ syndrome   . UTI (urinary tract infection)   . Chicken pox as a child  . Measles as a child  . Mumps as a child  . Preventative health care 02/07/2013  . Hx: UTI (urinary tract infection) 02/07/2013  . Arthritis of right knee 02/07/2013    Past Surgical History  Procedure Laterality Date  . Scar tissue break up  2006    Dr Alfred Levins  . Wisdom tooth extraction  as a teenager  . Appendectomy  3 rd grade  . Mole removal      benign- mole on right thigh    Family History  Problem Relation Age of Onset  . Osteoporosis Mother   . Diabetes Mother     type 2  . Cancer Mother 21    stomach  . Colon polyps Mother     benign  . Hypertension Father   . Hyperlipidemia Father   . Other Sister     herniated disk in back, low blood pressure  . Hernia Brother     X 2  . Hypertension Sister   . Depression Sister     History   Social History  . Marital Status: Married    Spouse Name: N/A  . Number of Children: N/A  . Years of Education: N/A   Occupational History  . Not on file.   Social History Main Topics  . Smoking status: Never Smoker   . Smokeless tobacco: Never Used  . Alcohol Use: 0.6 oz/week    1 Standard drinks or  equivalent per week     Comment: occasional wine  . Drug Use: No  . Sexual Activity:    Partners: Male    Birth Control/ Protection: Post-menopausal   Other Topics Concern  . Not on file   Social History Narrative    Current Outpatient Prescriptions on File Prior to Visit  Medication Sig Dispense Refill  . estradiol (ESTRACE) 0.1 MG/GM vaginal cream Use 1/2 g vaginally two or three times per week as needed to maintain symptom relief. 42.5 g 2  . LIDODERM 5 %   2   Current Facility-Administered Medications on File Prior to Visit  Medication Dose Route Frequency Provider Last Rate Last Dose  . methylPREDNISolone acetate (DEPO-MEDROL) injection 40 mg  40 mg Intramuscular Once Brunetta Jeans, PA-C        Allergies  Allergen Reactions  . Penicillins Rash    Review of Systems  Review of Systems  Constitutional: Negative for fever, chills and malaise/fatigue.  HENT: Negative for congestion, hearing loss and nosebleeds.   Eyes: Negative for discharge.  Respiratory: Negative for cough, sputum production, shortness of breath and wheezing.   Cardiovascular: Negative for  chest pain, palpitations and leg swelling.  Gastrointestinal: Negative for heartburn, nausea, vomiting, abdominal pain, diarrhea, constipation and blood in stool.  Genitourinary: Negative for dysuria, urgency, frequency and hematuria.  Musculoskeletal: Negative for myalgias, back pain and falls.  Neurological: Negative for dizziness, tremors, sensory change, focal weakness, loss of consciousness, weakness and headaches.  Endo/Heme/Allergies: Negative for polydipsia. Does not bruise/bleed easily.  Psychiatric/Behavioral: Negative for depression and suicidal ideas. The patient is not nervous/anxious and does not have insomnia.     Objective  BP 102/62 mmHg  Pulse 70  Temp(Src) 97.6 F (36.4 C) (Oral)  Ht 5\' 6"  (1.676 m)  Wt 140 lb (63.504 kg)  BMI 22.61 kg/m2  SpO2 96%  LMP 04/15/2008  Physical  Exam  Physical Exam  Constitutional: She is oriented to person, place, and time and well-developed, well-nourished, and in no distress. No distress.  HENT:  Head: Normocephalic and atraumatic.  Eyes: Conjunctivae are normal.  Neck: Neck supple. No thyromegaly present.  Cardiovascular: Normal rate, regular rhythm and normal heart sounds.   No murmur heard. Pulmonary/Chest: Effort normal and breath sounds normal. She has no wheezes.  Abdominal: She exhibits no distension and no mass.  Musculoskeletal: She exhibits no edema.  Lymphadenopathy:    She has no cervical adenopathy.  Neurological: She is alert and oriented to person, place, and time.  Skin: Skin is warm and dry. No rash noted. She is not diaphoretic.  Psychiatric: Memory, affect and judgment normal.    Lab Results  Component Value Date   TSH 1.530 07/27/2013   Lab Results  Component Value Date   WBC 9.5 07/27/2013   HGB 13.2 07/27/2013   HCT 38.2 07/27/2013   MCV 85.8 07/27/2013   PLT 258 07/27/2013   Lab Results  Component Value Date   CREATININE 0.76 07/27/2013   BUN 11 07/27/2013   NA 139 07/27/2013   K 4.3 07/27/2013   CL 103 07/27/2013   CO2 26 07/27/2013   Lab Results  Component Value Date   ALT 12 07/27/2013   AST 15 07/27/2013   ALKPHOS 69 07/27/2013   BILITOT 0.5 07/27/2013   Lab Results  Component Value Date   CHOL 212* 07/27/2013   Lab Results  Component Value Date   HDL 64 07/27/2013   Lab Results  Component Value Date   LDLCALC 128* 07/27/2013   Lab Results  Component Value Date   TRIG 99 07/27/2013   Lab Results  Component Value Date   CHOLHDL 3.3 07/27/2013     Assessment & Plan  Osteoarthritis of both knees Encouraged to keep running keep Advil down to2 tabs twice daily with food, consider Salon Pas prn and Curcumen daily, report if worse   Preventative health care Patient encouraged to maintain heart healthy diet, regular exercise, adequate sleep. Consider daily  probiotics. Take medications as prescribed   Otitis media of both ears Symptoms resolved with treatment   Hyperlipidemia, mixed Encouraged heart healthy diet, increase exercise, avoid trans fats, consider a krill oil cap daily

## 2014-08-21 ENCOUNTER — Encounter: Payer: Self-pay | Admitting: Family Medicine

## 2014-08-21 DIAGNOSIS — E782 Mixed hyperlipidemia: Secondary | ICD-10-CM

## 2014-08-21 HISTORY — DX: Mixed hyperlipidemia: E78.2

## 2014-08-21 NOTE — Assessment & Plan Note (Signed)
Symptoms resolved with treatment

## 2014-08-21 NOTE — Assessment & Plan Note (Signed)
Encouraged heart healthy diet, increase exercise, avoid trans fats, consider a krill oil cap daily 

## 2014-10-10 ENCOUNTER — Encounter: Payer: Self-pay | Admitting: Family Medicine

## 2014-11-15 ENCOUNTER — Encounter: Payer: Self-pay | Admitting: Family Medicine

## 2015-01-15 ENCOUNTER — Ambulatory Visit (INDEPENDENT_AMBULATORY_CARE_PROVIDER_SITE_OTHER): Payer: Managed Care, Other (non HMO) | Admitting: Family Medicine

## 2015-01-15 VITALS — BP 110/68 | HR 58 | Temp 98.1°F | Resp 18 | Ht 66.0 in | Wt 139.0 lb

## 2015-01-15 DIAGNOSIS — R110 Nausea: Secondary | ICD-10-CM | POA: Diagnosis not present

## 2015-01-15 DIAGNOSIS — H8112 Benign paroxysmal vertigo, left ear: Secondary | ICD-10-CM

## 2015-01-15 DIAGNOSIS — J309 Allergic rhinitis, unspecified: Secondary | ICD-10-CM

## 2015-01-15 DIAGNOSIS — H9192 Unspecified hearing loss, left ear: Secondary | ICD-10-CM

## 2015-01-15 DIAGNOSIS — H9202 Otalgia, left ear: Secondary | ICD-10-CM

## 2015-01-15 LAB — POCT CBC
Granulocyte percent: 61.8 %G (ref 37–80)
HCT, POC: 41.6 % (ref 37.7–47.9)
HEMOGLOBIN: 13.6 g/dL (ref 12.2–16.2)
LYMPH, POC: 1.7 (ref 0.6–3.4)
MCH: 28.3 pg (ref 27–31.2)
MCHC: 32.7 g/dL (ref 31.8–35.4)
MCV: 86.4 fL (ref 80–97)
MID (cbc): 0.4 (ref 0–0.9)
MPV: 6.6 fL (ref 0–99.8)
POC Granulocyte: 3.5 (ref 2–6.9)
POC LYMPH PERCENT: 30.2 %L (ref 10–50)
POC MID %: 8 %M (ref 0–12)
Platelet Count, POC: 279 10*3/uL (ref 142–424)
RBC: 4.82 M/uL (ref 4.04–5.48)
RDW, POC: 14.2 %
WBC: 5.6 10*3/uL (ref 4.6–10.2)

## 2015-01-15 MED ORDER — MECLIZINE HCL 25 MG PO TABS: 25.0000 mg | ORAL_TABLET | Freq: Four times a day (QID) | ORAL | Status: DC | PRN

## 2015-01-15 MED ORDER — ONDANSETRON 4 MG PO TBDP: 4.0000 mg | ORAL_TABLET | Freq: Once | ORAL | Status: DC

## 2015-01-15 MED ORDER — ONDANSETRON 4 MG PO TBDP: 4.0000 mg | ORAL_TABLET | Freq: Three times a day (TID) | ORAL | Status: DC | PRN

## 2015-01-15 NOTE — Patient Instructions (Signed)
Benign Positional Vertigo °Vertigo means you feel like you or your surroundings are moving when they are not. Benign positional vertigo is the most common form of vertigo. Benign means that the cause of your condition is not serious. Benign positional vertigo is more common in older adults. °CAUSES  °Benign positional vertigo is the result of an upset in the labyrinth system. This is an area in the middle ear that helps control your balance. This may be caused by a viral infection, head injury, or repetitive motion. However, often no specific cause is found. °SYMPTOMS  °Symptoms of benign positional vertigo occur when you move your head or eyes in different directions. Some of the symptoms may include: °1. Loss of balance and falls. °2. Vomiting. °3. Blurred vision. °4. Dizziness. °5. Nausea. °6. Involuntary eye movements (nystagmus). °DIAGNOSIS  °Benign positional vertigo is usually diagnosed by physical exam. If the specific cause of your benign positional vertigo is unknown, your caregiver may perform imaging tests, such as magnetic resonance imaging (MRI) or computed tomography (CT). °TREATMENT  °Your caregiver may recommend movements or procedures to correct the benign positional vertigo. Medicines such as meclizine, benzodiazepines, and medicines for nausea may be used to treat your symptoms. In rare cases, if your symptoms are caused by certain conditions that affect the inner ear, you may need surgery. °HOME CARE INSTRUCTIONS  °· Follow your caregiver's instructions. °· Move slowly. Do not make sudden body or head movements. °· Avoid driving. °· Avoid operating heavy machinery. °· Avoid performing any tasks that would be dangerous to you or others during a vertigo episode. °· Drink enough fluids to keep your urine clear or pale yellow. °SEEK IMMEDIATE MEDICAL CARE IF:  °· You develop problems with walking, weakness, numbness, or using your arms, hands, or legs. °· You have difficulty speaking. °· You develop  severe headaches. °· Your nausea or vomiting continues or gets worse. °· You develop visual changes. °· Your family or friends notice any behavioral changes. °· Your condition gets worse. °· You have a fever. °· You develop a stiff neck or sensitivity to light. °MAKE SURE YOU:  °· Understand these instructions. °· Will watch your condition. °· Will get help right away if you are not doing well or get worse. °Document Released: 01/07/2006 Document Revised: 06/24/2011 Document Reviewed: 12/20/2010 °Exit[employer]® Patient Information ©2015 [employer]. This information is not intended to replace advice given to you by your health care provider. Make sure you discuss any questions you have with your health care provider. ° °Epley Maneuver Self-Care °WHAT IS THE EPLEY MANEUVER? °The Epley maneuver is an exercise you can do to relieve symptoms of benign paroxysmal positional vertigo (BPPV). This condition is often just referred to as vertigo. BPPV is caused by the movement of tiny crystals (canaliths) inside your inner ear. The accumulation and movement of canaliths in your inner ear causes a sudden spinning sensation (vertigo) when you move your head to certain positions. Vertigo usually lasts about 30 seconds. BPPV usually occurs in just one ear. If you get vertigo when you lie on your left side, you probably have BPPV in your left ear. Your health care provider can tell you which ear is involved.  °BPPV may be caused by a head injury. Many people older than 50 get BPPV for unknown reasons. If you have been diagnosed with BPPV, your health care provider may teach you how to do this maneuver. BPPV is not life threatening (benign) and usually goes away in time.  °  WHEN SHOULD I PERFORM THE EPLEY MANEUVER? °You can do this maneuver at home whenever you have symptoms of vertigo. You may do the Epley maneuver up to 3 times a day until your symptoms of vertigo go away. °HOW SHOULD I DO THE EPLEY MANEUVER? °7. Sit on the edge of a  bed or table with your back straight. Your legs should be extended or hanging over the edge of the bed or table.   °8. Turn your head halfway toward the affected ear.   °9. Lie backward quickly with your head turned until you are lying flat on your back. You may want to position a pillow under your shoulders.   °10. Hold this position for 30 seconds. You may experience an attack of vertigo. This is normal. Hold this position until the vertigo stops. °11. Then turn your head to the opposite direction until your unaffected ear is facing the floor.   °12. Hold this position for 30 seconds. You may experience an attack of vertigo. This is normal. Hold this position until the vertigo stops. °13. Now turn your whole body to the same side as your head. Hold for another 30 seconds.   °14. You can then sit back up. °ARE THERE RISKS TO THIS MANEUVER? °In some cases, you may have other symptoms (such as changes in your vision, weakness, or numbness). If you have these symptoms, stop doing the maneuver and call your health care provider. Even if doing these maneuvers relieves your vertigo, you may still have dizziness. Dizziness is the sensation of light-headedness but without the sensation of movement. Even though the Epley maneuver may relieve your vertigo, it is possible that your symptoms will return within 5 years. °WHAT SHOULD I DO AFTER THIS MANEUVER? °After doing the Epley maneuver, you can return to your normal activities. Ask your doctor if there is anything you should do at home to prevent vertigo. This may include: °· Sleeping with two or more pillows to keep your head elevated. °· Not sleeping on the side of your affected ear. °· Getting up slowly from bed. °· Avoiding sudden movements during the day. °· Avoiding extreme head movement, like looking up or bending over. °· Wearing a cervical collar to prevent sudden head movements. °WHAT SHOULD I DO IF MY SYMPTOMS GET WORSE? °Call your health care provider if your  vertigo gets worse. Call your provider right way if you have other symptoms, including:  °· Nausea. °· Vomiting. °· Headache. °· Weakness. °· Numbness. °· Vision changes. °Document Released: 04/06/2013 Document Reviewed: 04/06/2013 °ExitCare® Patient Information ©2015 [employer]. This information is not intended to replace advice given to you by your health care provider. Make sure you discuss any questions you have with your health care provider. ° °

## 2015-01-15 NOTE — Progress Notes (Signed)
Subjective:  This chart was scribed for Delman Cheadle, MD by Moises Blood, Medical Scribe. This patient was seen in Room 1 and the patient's care was started 11:14 AM.    Patient ID: Ellen Morrow, female    DOB: 1955-05-27, 59 y.o.   MRN: [id number] Chief Complaint  Patient presents with  . Dizziness    since wednesday morning   . Nausea    all day     HPI Ellen Morrow is a 59 y.o. female who presents to [facility] complaining of dizziness with nausea that started 4 days ago.  When she woke up and turned her neck in the morning 4 days ago, she states that the room felt like it was spinning. She feels better when she sits down or stands up. She turned her neck to look at blind spot while driving yesterday and became really dizzy. She hasn't taken any medication for this. She has history of vertigo (BPPV) 5 years ago and was advised to do epley maneuver exercises to relief.   She's been blowing out clear mucus as well.  Last couple of years, her left ear has been very tender. She has seen audiology previously for hearing loss. She denies fever, chills. She denies taking any allergy medication.   Past Medical History  Diagnosis Date  . TMJ syndrome   . UTI (urinary tract infection)   . Chicken pox as a child  . Measles as a child  . Mumps as a child  . Preventative health care 02/07/2013  . Hx: UTI (urinary tract infection) 02/07/2013  . Arthritis of right knee 02/07/2013  . Osteoarthritis of both knees 02/07/2013  . Hyperlipidemia, mixed 08/21/2014   Prior to Admission medications   Medication Sig Start Date End Date Taking? Authorizing Provider  estradiol (ESTRACE) 0.1 MG/GM vaginal cream Use 1/2 g vaginally two or three times per week as needed to maintain symptom relief. 05/20/14  Yes [place], MD  KRILL OIL PO Take by mouth.   Yes Historical Provider, MD  Magnesium 250 MG TABS Take by mouth daily.   Yes Historical Provider, MD  LIDODERM 5 %  03/21/14   Historical  Provider, MD   Allergies  Allergen Reactions  . Penicillins Rash    Review of Systems  Constitutional: Negative for fever, chills and fatigue.  HENT: Positive for ear pain, postnasal drip and rhinorrhea. Negative for sneezing and sore throat.   Respiratory: Negative for cough.   Gastrointestinal: Positive for nausea. Negative for vomiting, diarrhea and constipation.  Neurological: Positive for dizziness.       Objective:   Physical Exam  Constitutional: She is oriented to person, place, and time. She appears well-developed and well-nourished. No distress.  HENT:  Head: Normocephalic and atraumatic.  Right Ear: Tympanic membrane and ear canal normal.  Left Ear: Ear canal normal. Tympanic membrane is retracted (small amount of cerumen on upper aspect).  Nose: Mucosal edema present.  Mouth/Throat: Posterior oropharyngeal edema and posterior oropharyngeal erythema present. No oropharyngeal exudate.  Positive BPPV on the left; severe left canal tenderness; no tenderness with tragus, minimal pain with pinna  Eyes: EOM are normal. Pupils are equal, round, and reactive to light.  Neck: Neck supple.  Cardiovascular: Normal rate, regular rhythm, S1 normal, S2 normal and normal heart sounds.   No murmur heard. Pulses:      Posterior tibial pulses are 2+ on the right side, and 2+ on the left side.  Pulmonary/Chest: Effort  normal and breath sounds normal. No respiratory distress. She has no wheezes.  Musculoskeletal: Normal range of motion. She exhibits no edema.  Neurological: She is alert and oriented to person, place, and time.  Skin: Skin is warm and dry.  Psychiatric: She has a normal mood and affect. Her behavior is normal.  Nursing note and vitals reviewed.   BP 110/68 mmHg  Pulse 58  Temp(Src) 98.1 F (36.7 C) (Oral)  Resp 18  Ht 5\' 6"  (1.676 m)  Wt 139 lb (63.05 kg)  BMI 22.45 kg/m2  SpO2 98%  LMP 04/15/2008  She was given Zofran 4mg  in the room.      Results for  orders placed or performed in visit on 01/15/15  POCT CBC  Result Value Ref Range   WBC 5.6 4.6 - 10.2 K/uL   Lymph, poc 1.7 0.6 - 3.4   POC LYMPH PERCENT 30.2 10 - 50 %L   MID (cbc) 0.4 0 - 0.9   POC MID % 8.0 0 - 12 %M   POC Granulocyte 3.5 2 - 6.9   Granulocyte percent 61.8 37 - 80 %G   RBC 4.82 4.04 - 5.48 M/uL   Hemoglobin 13.6 12.2 - 16.2 g/dL   HCT, POC 41.6 37.7 - 47.9 %   MCV 86.4 80 - 97 fL   MCH, POC 28.3 27 - 31.2 pg   MCHC 32.7 31.8 - 35.4 g/dL   RDW, POC 14.2 %   Platelet Count, POC 279 142 - 424 K/uL   MPV 6.6 0 - 99.8 fL    Assessment & Plan:    1. BPPV (benign paroxysmal positional vertigo), left - reviewed epley manuvers in office and handouts given to do at home several times/d until sxs resolved. Reviewed etiology and natural hx/prognosis in detail.  sxs improved SIG on zofran in office so will use that at home but ok to switch to or add in meclizine if needed. If both meds fail, call in for valium rx as well as PT vs neuro referral.  2. Pain of ear structure, left - unsure of etiology as pt did not notice until exam today - try top hydrogen peroxide, RTC if cont/worsenings  3. Nausea without vomiting   4. Allergic rhinitis, unspecified allergic rhinitis type   5. Hearing loss, left - f/u w/ ENT prn    Orders Placed This Encounter  Procedures  . Ambulatory referral to ENT    Referral Priority:  Routine    Referral Type:  Consultation    Referral Reason:  Specialty Services Required    Requested Specialty:  Otolaryngology    Number of Visits Requested:  1  . POCT CBC    Meds ordered this encounter  Medications  . KRILL OIL PO    Sig: Take by mouth.  . meclizine (ANTIVERT) 25 MG tablet    Sig: Take 1 tablet (25 mg total) by mouth 4 (four) times daily as needed for dizziness or nausea.    Dispense:  60 tablet    Refill:  0  . ondansetron (ZOFRAN-ODT) disintegrating tablet 4 mg    Sig:   . ondansetron (ZOFRAN ODT) 4 MG disintegrating tablet    Sig:  Take 1 tablet (4 mg total) by mouth every 8 (eight) hours as needed for nausea or vomiting.    Dispense:  40 tablet    Refill:  0    I personally performed the services described in this documentation, which was scribed in my presence. The  recorded information has been reviewed and considered, and addended by me as needed.  Delman Cheadle, MD MPH  By signing my name below, I, Moises Blood, attest that this documentation has been prepared under the direction and in the presence of Delman Cheadle, MD. Electronically Signed: Moises Blood, [place]. 01/15/2015 , 11:14 AM .

## 2015-02-20 ENCOUNTER — Telehealth: Payer: Self-pay | Admitting: Family Medicine

## 2015-02-20 NOTE — Telephone Encounter (Signed)
Patient is requesting to have Shingles Vac  Plse call her at [phone]

## 2015-02-20 NOTE — Telephone Encounter (Signed)
OK for her to Zostavax but some insurance plans will not pay until 60 as long as she has confirmed with insurance she can have it

## 2015-02-20 NOTE — Telephone Encounter (Signed)
Called patient back to inform her of below. Patient will call insurance to see if it is cheaper to get it from her provider or from the pharmacy

## 2015-03-16 NOTE — Telephone Encounter (Signed)
Pt states her husband has money in his flex account. It is not covered by her insurance. Pt is scheduled for 03/22/15.

## 2015-03-22 ENCOUNTER — Ambulatory Visit: Payer: Managed Care, Other (non HMO)

## 2015-03-22 ENCOUNTER — Ambulatory Visit (INDEPENDENT_AMBULATORY_CARE_PROVIDER_SITE_OTHER): Payer: Self-pay

## 2015-03-22 DIAGNOSIS — Z23 Encounter for immunization: Secondary | ICD-10-CM

## 2015-03-22 MED ORDER — ZOSTER VACCINE LIVE 19400 UNT/0.65ML ~~LOC~~ SOLR: 0.6500 mL | Freq: Once | SUBCUTANEOUS | Status: DC

## 2015-03-24 ENCOUNTER — Ambulatory Visit: Payer: Managed Care, Other (non HMO)

## 2015-04-03 ENCOUNTER — Encounter: Payer: Self-pay | Admitting: Physician Assistant

## 2015-04-03 ENCOUNTER — Ambulatory Visit (INDEPENDENT_AMBULATORY_CARE_PROVIDER_SITE_OTHER): Payer: Managed Care, Other (non HMO) | Admitting: Physician Assistant

## 2015-04-03 VITALS — BP 118/70 | HR 76 | Temp 98.1°F | Ht 66.0 in | Wt 141.4 lb

## 2015-04-03 DIAGNOSIS — B9689 Other specified bacterial agents as the cause of diseases classified elsewhere: Secondary | ICD-10-CM

## 2015-04-03 DIAGNOSIS — J019 Acute sinusitis, unspecified: Secondary | ICD-10-CM | POA: Diagnosis not present

## 2015-04-03 MED ORDER — DOXYCYCLINE HYCLATE 100 MG PO CAPS: 100.0000 mg | ORAL_CAPSULE | Freq: Two times a day (BID) | ORAL | Status: DC

## 2015-04-03 NOTE — Progress Notes (Signed)
Pre visit review using our clinic review tool, if applicable. No additional management support is needed unless otherwise documented below in the visit note. 

## 2015-04-03 NOTE — Progress Notes (Signed)
Patient presents to clinic today c/o 2 weeks of sinus pressure, maxillary sinus pain, chest congestion with a productive cough. Sputum is clear. Denies fever, chills, recent travel or sick contact. Has taken Mucinex with only minimal relief in symptoms.  Past Medical History  Diagnosis Date  . TMJ syndrome   . UTI (urinary tract infection)   . Chicken pox as a child  . Measles as a child  . Mumps as a child  . Preventative health care 02/07/2013  . Hx: UTI (urinary tract infection) 02/07/2013  . Arthritis of right knee 02/07/2013  . Osteoarthritis of both knees 02/07/2013  . Hyperlipidemia, mixed 08/21/2014    Current Outpatient Prescriptions on File Prior to Visit  Medication Sig Dispense Refill  . estradiol (ESTRACE) 0.1 MG/GM vaginal cream Use 1/2 g vaginally two or three times per week as needed to maintain symptom relief. 42.5 g 2  . KRILL OIL PO Take by mouth.    Marland Kitchen LIDODERM 5 %   2  . Magnesium 250 MG TABS Take by mouth daily.    . meclizine (ANTIVERT) 25 MG tablet Take 1 tablet (25 mg total) by mouth 4 (four) times daily as needed for dizziness or nausea. (Patient not taking: Reported on 04/03/2015) 60 tablet 0  . ondansetron (ZOFRAN ODT) 4 MG disintegrating tablet Take 1 tablet (4 mg total) by mouth every 8 (eight) hours as needed for nausea or vomiting. (Patient not taking: Reported on 04/03/2015) 40 tablet 0   Current Facility-Administered Medications on File Prior to Visit  Medication Dose Route Frequency Provider Last Rate Last Dose  . methylPREDNISolone acetate (DEPO-MEDROL) injection 40 mg  40 mg Intramuscular Once Brunetta Jeans, PA-C      . ondansetron (ZOFRAN-ODT) disintegrating tablet 4 mg  4 mg Oral Once Shawnee Knapp, MD        Allergies  Allergen Reactions  . Penicillins Rash    Family History  Problem Relation Age of Onset  . Osteoporosis Mother   . Diabetes Mother     type 2  . Cancer Mother 27    stomach  . Colon polyps Mother     benign  .  Hypertension Father   . Hyperlipidemia Father   . Other Sister     herniated disk in back, low blood pressure  . Hernia Brother     X 2  . Hypertension Sister   . Depression Sister   . Cancer Maternal Grandmother     ? lung vs breast  . Cancer Maternal Aunt     Social History   Social History  . Marital Status: Married    Spouse Name: N/A  . Number of Children: N/A  . Years of Education: N/A   Social History Main Topics  . Smoking status: Never Smoker   . Smokeless tobacco: Never Used  . Alcohol Use: 0.6 oz/week    1 Standard drinks or equivalent per week     Comment: occasional wine  . Drug Use: No  . Sexual Activity:    Partners: Male    Birth Control/ Protection: Post-menopausal     Comment: lives with husband, exercising regularly, no dietary restrictions   Other Topics Concern  . None   Social History Narrative   Review of Systems - See HPI.  All other ROS are negative.  BP 118/70 mmHg  Pulse 76  Temp(Src) 98.1 F (36.7 C) (Oral)  Ht 5\' 6"  (1.676 m)  Wt 141 lb 6.4 oz (64.139  kg)  BMI 22.83 kg/m2  SpO2 98%  LMP 04/15/2008  Physical Exam  Constitutional: She is oriented to person, place, and time and well-developed, well-nourished, and in no distress.  HENT:  Head: Normocephalic and atraumatic.  Right Ear: External ear normal.  Left Ear: External ear normal.  Nose: Nose normal.  Mouth/Throat: Oropharynx is clear and moist. No oropharyngeal exudate.  + TTP of maxillary sinuses bilaterally.  Eyes: Conjunctivae are normal.  Neck: Neck supple.  Cardiovascular: Normal rate, regular rhythm, normal heart sounds and intact distal pulses.   Pulmonary/Chest: Effort normal and breath sounds normal. No respiratory distress. She has no wheezes. She has no rales. She exhibits no tenderness.  Neurological: She is alert and oriented to person, place, and time.  Skin: Skin is warm and dry. No rash noted.  Psychiatric: Affect normal.  Vitals reviewed.   Recent  Results (from the past 2160 hour(s))  POCT CBC     Status: None   Collection Time: 01/15/15 11:42 AM  Result Value Ref Range   WBC 5.6 4.6 - 10.2 K/uL   Lymph, poc 1.7 0.6 - 3.4   POC LYMPH PERCENT 30.2 10 - 50 %L   MID (cbc) 0.4 0 - 0.9   POC MID % 8.0 0 - 12 %M   POC Granulocyte 3.5 2 - 6.9   Granulocyte percent 61.8 37 - 80 %G   RBC 4.82 4.04 - 5.48 M/uL   Hemoglobin 13.6 12.2 - 16.2 g/dL   HCT, POC 41.6 37.7 - 47.9 %   MCV 86.4 80 - 97 fL   MCH, POC 28.3 27 - 31.2 pg   MCHC 32.7 31.8 - 35.4 g/dL   RDW, POC 14.2 %   Platelet Count, POC 279 142 - 424 K/uL   MPV 6.6 0 - 99.8 fL    Assessment/Plan: Acute bacterial sinusitis Rx Doxycycline.  Increase fluids.  Rest.  Saline nasal spray.  Probiotic.  Mucinex-DM as directed.  Humidifier in bedroom.  Call or return to clinic if symptoms are not improving.

## 2015-04-03 NOTE — Patient Instructions (Signed)
Please take antibiotic as directed.  Increase fluid intake.  Use Saline nasal spray.  Take a daily multivitamin. Continue Mucinex-DM.  Place a humidifier in the bedroom.  Please call or return clinic if symptoms are not improving.  Sinusitis Sinusitis is redness, soreness, and swelling (inflammation) of the paranasal sinuses. Paranasal sinuses are air pockets within the bones of your face (beneath the eyes, the middle of the forehead, or above the eyes). In healthy paranasal sinuses, mucus is able to drain out, and air is able to circulate through them by way of your nose. However, when your paranasal sinuses are inflamed, mucus and air can become trapped. This can allow bacteria and other germs to grow and cause infection. Sinusitis can develop quickly and last only a short time (acute) or continue over a long period (chronic). Sinusitis that lasts for more than 12 weeks is considered chronic.  CAUSES  Causes of sinusitis include:  Allergies.  Structural abnormalities, such as displacement of the cartilage that separates your nostrils (deviated septum), which can decrease the air flow through your nose and sinuses and affect sinus drainage.  Functional abnormalities, such as when the small hairs (cilia) that line your sinuses and help remove mucus do not work properly or are not present. SYMPTOMS  Symptoms of acute and chronic sinusitis are the same. The primary symptoms are pain and pressure around the affected sinuses. Other symptoms include:  Upper toothache.  Earache.  Headache.  Bad breath.  Decreased sense of smell and taste.  A cough, which worsens when you are lying flat.  Fatigue.  Fever.  Thick drainage from your nose, which often is green and may contain pus (purulent).  Swelling and warmth over the affected sinuses. DIAGNOSIS  Your caregiver will perform a physical exam. During the exam, your caregiver may:  Look in your nose for signs of abnormal growths in your  nostrils (nasal polyps).  Tap over the affected sinus to check for signs of infection.  View the inside of your sinuses (endoscopy) with a special imaging device with a light attached (endoscope), which is inserted into your sinuses. If your caregiver suspects that you have chronic sinusitis, one or more of the following tests may be recommended:  Allergy tests.  Nasal culture A sample of mucus is taken from your nose and sent to a lab and screened for bacteria.  Nasal cytology A sample of mucus is taken from your nose and examined by your caregiver to determine if your sinusitis is related to an allergy. TREATMENT  Most cases of acute sinusitis are related to a viral infection and will resolve on their own within 10 days. Sometimes medicines are prescribed to help relieve symptoms (pain medicine, decongestants, nasal steroid sprays, or saline sprays).  However, for sinusitis related to a bacterial infection, your caregiver will prescribe antibiotic medicines. These are medicines that will help kill the bacteria causing the infection.  Rarely, sinusitis is caused by a fungal infection. In theses cases, your caregiver will prescribe antifungal medicine. For some cases of chronic sinusitis, surgery is needed. Generally, these are cases in which sinusitis recurs more than 3 times per year, despite other treatments. HOME CARE INSTRUCTIONS   Drink plenty of water. Water helps thin the mucus so your sinuses can drain more easily.  Use a humidifier.  Inhale steam 3 to 4 times a day (for example, sit in the bathroom with the shower running).  Apply a warm, moist washcloth to your face 3 to 4 times  a day, or as directed by your caregiver.  Use saline nasal sprays to help moisten and clean your sinuses.  Take over-the-counter or prescription medicines for pain, discomfort, or fever only as directed by your caregiver. SEEK IMMEDIATE MEDICAL CARE IF:  You have increasing pain or severe  headaches.  You have nausea, vomiting, or drowsiness.  You have swelling around your face.  You have vision problems.  You have a stiff neck.  You have difficulty breathing. MAKE SURE YOU:   Understand these instructions.  Will watch your condition.  Will get help right away if you are not doing well or get worse. Document Released: 04/01/2005 Document Revised: 06/24/2011 Document Reviewed: 04/16/2011 Exit[employer] Patient Information 2014 [employer].   

## 2015-04-03 NOTE — Assessment & Plan Note (Signed)
Rx Doxycycline.  Increase fluids.  Rest.  Saline nasal spray.  Probiotic.  Mucinex-DM as directed.  Humidifier in bedroom.  Call or return to clinic if symptoms are not improving.

## 2015-05-26 ENCOUNTER — Ambulatory Visit: Payer: Managed Care, Other (non HMO) | Admitting: Obstetrics and Gynecology

## 2015-05-29 ENCOUNTER — Ambulatory Visit (INDEPENDENT_AMBULATORY_CARE_PROVIDER_SITE_OTHER): Payer: Managed Care, Other (non HMO) | Admitting: Obstetrics and Gynecology

## 2015-05-29 ENCOUNTER — Encounter: Payer: Self-pay | Admitting: Obstetrics and Gynecology

## 2015-05-29 VITALS — BP 110/72 | HR 70 | Resp 14 | Ht 66.0 in | Wt 142.0 lb

## 2015-05-29 DIAGNOSIS — N952 Postmenopausal atrophic vaginitis: Secondary | ICD-10-CM | POA: Diagnosis not present

## 2015-05-29 DIAGNOSIS — Z01419 Encounter for gynecological examination (general) (routine) without abnormal findings: Secondary | ICD-10-CM

## 2015-05-29 LAB — POCT URINALYSIS DIPSTICK
Bilirubin, UA: NEGATIVE
Blood, UA: NEGATIVE
Glucose, UA: NEGATIVE
KETONES UA: NEGATIVE
LEUKOCYTES UA: NEGATIVE
NITRITE UA: NEGATIVE
PROTEIN UA: NEGATIVE
Urobilinogen, UA: NEGATIVE
pH, UA: 5

## 2015-05-29 MED ORDER — ESTRADIOL 0.1 MG/GM VA CREA: TOPICAL_CREAM | VAGINAL | Status: DC

## 2015-05-29 NOTE — Progress Notes (Signed)
60 y.o. G2P2 Married  female here for annual exam.    Using vaginal estrogen cream for prevention of UTIs. Using cream twice a week.  Sometimes forgets.   Trying to lower cholesterol without taking medication.   PCP:   Vivien Rossetti, MD  Patient's last menstrual period was 04/15/2008.          Sexually active: Yes.    The current method of family planning is post menopausal status.  04/15/2008  Exercising: Yes.    Pilates and running Smoker:  no  Health Maintenance: Pap:  05/12/12 WNL Neg HR HPV History of abnormal Pap:  no MMG:  05/13/14 BIRADS Category 2 Benign @ [facility] Patient states she had MMG scheduled for last week but cx due to getting sick. Colonoscopy:  03/2007 normal with Dr. Sydell Axon Brodie:next colonoscopy due 03/2017. BMD:   11-26-2000 Dr. Georgeanna Lea TDaP:  06/2013 Screening Labs:  Hb today: PCP, Urine today:  neg   reports that she has never smoked. She has never used smokeless tobacco. She reports that she drinks about 0.6 oz of alcohol per week. She reports that she does not use illicit drugs.  Past Medical History  Diagnosis Date  . TMJ syndrome   . UTI (urinary tract infection)   . Chicken pox as a child  . Measles as a child  . Mumps as a child  . Preventative health care 02/07/2013  . Hx: UTI (urinary tract infection) 02/07/2013  . Arthritis of right knee 02/07/2013  . Osteoarthritis of both knees 02/07/2013  . Hyperlipidemia, mixed 08/21/2014    Past Surgical History  Procedure Laterality Date  . Scar tissue break up  2006    Dr Alfred Levins  . Wisdom tooth extraction  as a teenager  . Appendectomy  3 rd grade  . Mole removal      benign- mole on right thigh    Current Outpatient Prescriptions  Medication Sig Dispense Refill  . estradiol (ESTRACE) 0.1 MG/GM vaginal cream Use 1/2 g vaginally two or three times per week as needed to maintain symptom relief. 42.5 g 2  . KRILL OIL PO Take by mouth.    Marland Kitchen LIDODERM 5 %   2  . Magnesium 250 MG TABS Take  by mouth daily.    . meclizine (ANTIVERT) 25 MG tablet Take 1 tablet (25 mg total) by mouth 4 (four) times daily as needed for dizziness or nausea. (Patient not taking: Reported on 04/03/2015) 60 tablet 0  . ondansetron (ZOFRAN ODT) 4 MG disintegrating tablet Take 1 tablet (4 mg total) by mouth every 8 (eight) hours as needed for nausea or vomiting. (Patient not taking: Reported on 04/03/2015) 40 tablet 0   Current Facility-Administered Medications  Medication Dose Route Frequency Provider Last Rate Last Dose  . ondansetron (ZOFRAN-ODT) disintegrating tablet 4 mg  4 mg Oral Once Shawnee Knapp, MD       Facility-Administered Medications Ordered in Other Visits  Medication Dose Route Frequency Provider Last Rate Last Dose  . methylPREDNISolone acetate (DEPO-MEDROL) injection 40 mg  40 mg Intramuscular Once Brunetta Jeans, PA-C        Family History  Problem Relation Age of Onset  . Osteoporosis Mother   . Diabetes Mother     type 2  . Cancer Mother 54    stomach  . Colon polyps Mother     benign  . Colon cancer Mother 70    Tumor in colon removed Stage 2  . Hypertension Father   .  Hyperlipidemia Father   . Other Sister     herniated disk in back, low blood pressure  . Hernia Brother     X 2  . Hypertension Sister   . Depression Sister   . Cancer Maternal Grandmother     ? lung vs breast  . Cancer Maternal Aunt     ROS:  Pertinent items are noted in HPI.  Otherwise, a comprehensive ROS was negative.  Exam:   BP 110/72 mmHg  Pulse 70  Resp 14  Ht 5\' 6"  (1.676 m)  Wt 142 lb (64.411 kg)  BMI 22.93 kg/m2  LMP 04/15/2008    General appearance: alert, cooperative and appears stated age Head: Normocephalic, without obvious abnormality, atraumatic Neck: no adenopathy, supple, symmetrical, trachea midline and thyroid normal to inspection and palpation Lungs: clear to auscultation bilaterally Breasts: normal appearance, no masses or tenderness, Inspection negative, No nipple  retraction or dimpling, No nipple discharge or bleeding, No axillary or supraclavicular adenopathy Heart: regular rate and rhythm Abdomen: soft, non-tender; bowel sounds normal; no masses,  no organomegaly Extremities: extremities normal, atraumatic, no cyanosis or edema Skin: Skin color, texture, turgor normal. No rashes or lesions Lymph nodes: Cervical, supraclavicular, and axillary nodes normal. No abnormal inguinal nodes palpated Neurologic: Grossly normal  Pelvic: External genitalia:  no lesions              Urethra:  normal appearing urethra with no masses, tenderness or lesions              Bartholins and Skenes: normal                 Vagina: normal appearing vagina with normal color and discharge, no lesions              Cervix: no lesions              Pap taken: Yes.   Bimanual Exam:  Uterus:  normal size, contour, position, consistency, mobility, non-tender              Adnexa: normal adnexa and no mass, fullness, tenderness              Rectovaginal: Yes.  .  Confirms.              Anus:  normal sphincter tone, no lesions  Chaperone was present for exam.  Assessment:   Well woman visit with normal exam. Vaginal atrophy and recurrent UTIs treated with vaginal estrogen cream.   Plan: Yearly mammogram recommended after age 70.  Recommended self breast exam.  Pap and HR HPV as above. Discussed Calcium, Vitamin D, regular exercise program including cardiovascular and weight bearing exercise. Labs performed.  No..   See orders. Refills given on medications.  Yes.  .  See orders.  Estrace vaginal cream.  Discussed risks of thromboembolic events (DVT, PE, MI, stroke) and breast cancer.  Discussed Vagifem as an alternative.  Follow up annually and prn.      After visit summary provided.

## 2015-05-29 NOTE — Patient Instructions (Signed)

## 2015-05-31 LAB — IPS PAP TEST WITH HPV

## 2015-06-21 LAB — HM MAMMOGRAPHY

## 2015-06-22 ENCOUNTER — Encounter: Payer: Self-pay | Admitting: Family Medicine

## 2015-08-14 ENCOUNTER — Encounter: Payer: Managed Care, Other (non HMO) | Admitting: Family Medicine

## 2015-08-15 ENCOUNTER — Ambulatory Visit (INDEPENDENT_AMBULATORY_CARE_PROVIDER_SITE_OTHER): Payer: Managed Care, Other (non HMO) | Admitting: Family Medicine

## 2015-08-15 ENCOUNTER — Encounter: Payer: Self-pay | Admitting: Family Medicine

## 2015-08-15 VITALS — BP 108/78 | HR 62 | Temp 98.0°F | Ht 66.0 in | Wt 141.0 lb

## 2015-08-15 DIAGNOSIS — E782 Mixed hyperlipidemia: Secondary | ICD-10-CM | POA: Diagnosis not present

## 2015-08-15 DIAGNOSIS — G4700 Insomnia, unspecified: Secondary | ICD-10-CM | POA: Diagnosis not present

## 2015-08-15 DIAGNOSIS — Z0000 Encounter for general adult medical examination without abnormal findings: Secondary | ICD-10-CM

## 2015-08-15 DIAGNOSIS — M170 Bilateral primary osteoarthritis of knee: Secondary | ICD-10-CM | POA: Diagnosis not present

## 2015-08-15 DIAGNOSIS — R51 Headache: Secondary | ICD-10-CM

## 2015-08-15 DIAGNOSIS — R519 Headache, unspecified: Secondary | ICD-10-CM

## 2015-08-15 HISTORY — DX: Insomnia, unspecified: G47.00

## 2015-08-15 LAB — CBC
HEMATOCRIT: 40.4 % (ref 36.0–46.0)
Hemoglobin: 13.6 g/dL (ref 12.0–15.0)
MCHC: 33.7 g/dL (ref 30.0–36.0)
MCV: 87.6 fl (ref 78.0–100.0)
PLATELETS: 253 10*3/uL (ref 150.0–400.0)
RBC: 4.61 Mil/uL (ref 3.87–5.11)
RDW: 13.4 % (ref 11.5–15.5)
WBC: 5.7 10*3/uL (ref 4.0–10.5)

## 2015-08-15 LAB — COMPREHENSIVE METABOLIC PANEL
ALT: 12 U/L (ref 0–35)
AST: 15 U/L (ref 0–37)
Albumin: 4.5 g/dL (ref 3.5–5.2)
Alkaline Phosphatase: 68 U/L (ref 39–117)
BUN: 11 mg/dL (ref 6–23)
CO2: 29 meq/L (ref 19–32)
Calcium: 9.7 mg/dL (ref 8.4–10.5)
Chloride: 103 mEq/L (ref 96–112)
Creatinine, Ser: 0.78 mg/dL (ref 0.40–1.20)
GFR: 80.41 mL/min (ref >60.00)
GLUCOSE: 84 mg/dL (ref 70–99)
POTASSIUM: 3.8 meq/L (ref 3.5–5.1)
SODIUM: 138 meq/L (ref 135–145)
Total Bilirubin: 0.5 mg/dL (ref 0.2–1.2)
Total Protein: 7 g/dL (ref 6.0–8.3)

## 2015-08-15 LAB — LIPID PANEL
CHOL/HDL RATIO: 3
Cholesterol: 204 mg/dL — ABNORMAL HIGH (ref 0–200)
HDL: 78.4 mg/dL (ref >39.00)
LDL CALC: 107 mg/dL — AB (ref 0–99)
NONHDL: 125.75
Triglycerides: 92 mg/dL (ref 0.0–149.0)
VLDL: 18.4 mg/dL (ref 0.0–40.0)

## 2015-08-15 LAB — TSH: TSH: 1.39 u[IU]/mL (ref 0.35–4.50)

## 2015-08-15 NOTE — Assessment & Plan Note (Signed)
Falls asleep OK but awakens frequently, Encouraged good sleep hygiene such as dark, quiet room. No blue/green glowing lights such as computer screens in bedroom. No alcohol or stimulants in evening. Cut down on caffeine as able. Regular exercise is helpful but not just prior to bed time. OK to continue using Melatonin 5 mg

## 2015-08-15 NOTE — Progress Notes (Signed)
Pre visit review using our clinic review tool, if applicable. No additional management support is needed unless otherwise documented below in the visit note. 

## 2015-08-15 NOTE — Assessment & Plan Note (Signed)
Patient encouraged to maintain heart healthy diet, regular exercise, adequate sleep. Consider daily probiotics. Take medications as prescribed. Given and reviewed copy of ACP documents from [place] Secretary of State and encouraged to complete and return 

## 2015-08-15 NOTE — Assessment & Plan Note (Signed)
Encouraged heart healthy diet, increase exercise, avoid trans fats, consider a krill oil cap daily 

## 2015-08-15 NOTE — Progress Notes (Signed)
Subjective:    Patient ID: Ellen Morrow, female    DOB: [birth year], 60 y.o.   MRN: [id number]  Chief Complaint  Patient presents with  . Annual Exam    HPI Patient is in today for Annual Physical Exam.  Patient has some concerns about a recent fall were her head bounced off the ground and she landed on right side and now has a bruise on her right hip and some right jaw pain that was bothersome but has improved with movement. Denies CP/palp/SOB/HA/congestion/fevers/GI or GU c/o. Taking meds as prescribed. Struggles with staying asleep and finds melatonin helpful.   Past Medical History  Diagnosis Date  . TMJ syndrome   . UTI (urinary tract infection)   . Chicken pox as a child  . Measles as a child  . Mumps as a child  . Preventative health care 02/07/2013  . Hx: UTI (urinary tract infection) 02/07/2013  . Arthritis of right knee 02/07/2013  . Osteoarthritis of both knees 02/07/2013  . Hyperlipidemia, mixed 08/21/2014  . Insomnia 08/15/2015  . Headache 06/07/2014    Past Surgical History  Procedure Laterality Date  . Scar tissue break up  2006    Dr Alfred Levins  . Wisdom tooth extraction  as a teenager  . Appendectomy  3 rd grade  . Mole removal      benign- mole on right thigh    Family History  Problem Relation Age of Onset  . Osteoporosis Mother   . Diabetes Mother     type 2  . Cancer Mother 61    stomach  . Colon polyps Mother     benign  . Colon cancer Mother 45    Tumor in colon removed Stage 2  . Hypertension Father   . Hyperlipidemia Father   . Other Sister     herniated disk in back, low blood pressure  . Hernia Brother     X 2  . Hypertension Sister   . Depression Sister   . Cancer Maternal Grandmother     ? lung vs breast  . Cancer Maternal Aunt     Social History   Social History  . Marital Status: Married    Spouse Name: N/A  . Number of Children: N/A  . Years of Education: N/A   Occupational History  . Not on file.   Social  History Main Topics  . Smoking status: Never Smoker   . Smokeless tobacco: Never Used  . Alcohol Use: 0.6 oz/week    1 Standard drinks or equivalent per week     Comment: occasional wine  . Drug Use: No  . Sexual Activity:    Partners: Male    Birth Control/ Protection: Post-menopausal     Comment: lives with husband, exercising regularly, no dietary restrictions   Other Topics Concern  . Not on file   Social History Narrative    Outpatient Prescriptions Prior to Visit  Medication Sig Dispense Refill  . estradiol (ESTRACE) 0.1 MG/GM vaginal cream Use 1/2 g vaginally two or three times per week as needed to maintain symptom relief. 42.5 g 2  . KRILL OIL PO Take by mouth.    Marland Kitchen LIDODERM 5 %   2  . Magnesium 250 MG TABS Take by mouth daily.    . meclizine (ANTIVERT) 25 MG tablet Take 1 tablet (25 mg total) by mouth 4 (four) times daily as needed for dizziness or nausea. 60 tablet 0  . ondansetron (ZOFRAN ODT)  4 MG disintegrating tablet Take 1 tablet (4 mg total) by mouth every 8 (eight) hours as needed for nausea or vomiting. 40 tablet 0   Facility-Administered Medications Prior to Visit  Medication Dose Route Frequency Provider Last Rate Last Dose  . methylPREDNISolone acetate (DEPO-MEDROL) injection 40 mg  40 mg Intramuscular Once Brunetta Jeans, PA-C      . ondansetron (ZOFRAN-ODT) disintegrating tablet 4 mg  4 mg Oral Once Shawnee Knapp, MD        Allergies  Allergen Reactions  . Penicillins Rash    Review of Systems  Constitutional: Negative for fever and malaise/fatigue.  HENT: Negative for congestion.   Eyes: Negative for blurred vision.  Respiratory: Negative for shortness of breath.   Cardiovascular: Negative for chest pain, palpitations and leg swelling.  Gastrointestinal: Negative for nausea, abdominal pain and blood in stool.  Genitourinary: Negative for dysuria and frequency.  Musculoskeletal: Positive for joint pain. Negative for falls.  Skin: Negative for  rash.  Neurological: Positive for headaches. Negative for dizziness and loss of consciousness.  Endo/Heme/Allergies: Negative for environmental allergies.  Psychiatric/Behavioral: Negative for depression. The patient is not nervous/anxious.        Objective:    Physical Exam  Constitutional: She is oriented to person, place, and time. She appears well-developed and well-nourished. No distress.  HENT:  Head: Normocephalic and atraumatic.  Eyes: Conjunctivae are normal.  Neck: Neck supple. No thyromegaly present.  Cardiovascular: Normal rate, regular rhythm and normal heart sounds.   No murmur heard. Pulmonary/Chest: Effort normal and breath sounds normal. No respiratory distress.  Abdominal: Soft. Bowel sounds are normal. She exhibits no distension and no mass. There is no tenderness.  Musculoskeletal: She exhibits no edema.  Lymphadenopathy:    She has no cervical adenopathy.  Neurological: She is alert and oriented to person, place, and time.  Skin: Skin is warm and dry.  Psychiatric: She has a normal mood and affect. Her behavior is normal.    BP 108/78 mmHg  Pulse 62  Temp(Src) 98 F (36.7 C) (Oral)  Ht 5\' 6"  (1.676 m)  Wt 141 lb (63.957 kg)  BMI 22.77 kg/m2  SpO2 97%  LMP 04/15/2008 Wt Readings from Last 3 Encounters:  08/15/15 141 lb (63.957 kg)  05/29/15 142 lb (64.411 kg)  04/03/15 141 lb 6.4 oz (64.139 kg)     Lab Results  Component Value Date   WBC 5.7 08/15/2015   HGB 13.6 08/15/2015   HCT 40.4 08/15/2015   PLT 253.0 08/15/2015   GLUCOSE 84 08/15/2015   CHOL 204* 08/15/2015   TRIG 92.0 08/15/2015   HDL 78.40 08/15/2015   LDLCALC 107* 08/15/2015   ALT 12 08/15/2015   AST 15 08/15/2015   NA 138 08/15/2015   K 3.8 08/15/2015   CL 103 08/15/2015   CREATININE 0.78 08/15/2015   BUN 11 08/15/2015   CO2 29 08/15/2015   TSH 1.39 08/15/2015    Lab Results  Component Value Date   TSH 1.39 08/15/2015   Lab Results  Component Value Date   WBC 5.7  08/15/2015   HGB 13.6 08/15/2015   HCT 40.4 08/15/2015   MCV 87.6 08/15/2015   PLT 253.0 08/15/2015   Lab Results  Component Value Date   NA 138 08/15/2015   K 3.8 08/15/2015   CO2 29 08/15/2015   GLUCOSE 84 08/15/2015   BUN 11 08/15/2015   CREATININE 0.78 08/15/2015   BILITOT 0.5 08/15/2015   ALKPHOS 68 08/15/2015  AST 15 08/15/2015   ALT 12 08/15/2015   PROT 7.0 08/15/2015   ALBUMIN 4.5 08/15/2015   CALCIUM 9.7 08/15/2015   GFR 80.41 08/15/2015   Lab Results  Component Value Date   CHOL 204* 08/15/2015   Lab Results  Component Value Date   HDL 78.40 08/15/2015   Lab Results  Component Value Date   LDLCALC 107* 08/15/2015   Lab Results  Component Value Date   TRIG 92.0 08/15/2015   Lab Results  Component Value Date   CHOLHDL 3 08/15/2015   No results found for: HGBA1C     Assessment & Plan:   Problem List Items Addressed This Visit    Preventative health care - Primary    Patient encouraged to maintain heart healthy diet, regular exercise, adequate sleep. Consider daily probiotics. Take medications as prescribed. Given and reviewed copy of ACP documents from [place] of State and encouraged to complete and return      Relevant Orders   CBC (Completed)   TSH (Completed)   Lipid panel (Completed)   Comprehensive metabolic panel (Completed)   Osteoarthritis of both knees    Struggles with shoulder and hip pain as well. Encouraged to stay as active as tolerated and use topical treatments as needed      Insomnia    Falls asleep OK but awakens frequently, Encouraged good sleep hygiene such as dark, quiet room. No blue/green glowing lights such as computer screens in bedroom. No alcohol or stimulants in evening. Cut down on caffeine as able. Regular exercise is helpful but not just prior to bed time. OK to continue using Melatonin 5 mg      Hyperlipidemia, mixed    Encouraged heart healthy diet, increase exercise, avoid trans fats, consider a krill  oil cap daily      Headache    Improving but has been present since a recent fall when she hit her head. No associated symptoms. Should continue to improve. Will report if it does not          I have discontinued Ms. Faulds's meclizine and ondansetron. I am also having her maintain her LIDODERM, Magnesium, KRILL OIL PO, estradiol, Calcium Carbonate-Vitamin D, and Melatonin. We will continue to administer ondansetron.  Meds ordered this encounter  Medications  . Calcium Carbonate-Vitamin D (CALCIUM 600+D) 600-400 MG-UNIT tablet    Sig: Take 1 tablet by mouth daily.  . Melatonin 5 MG TABS    Sig: Take 1 tablet by mouth at bedtime.     Penni Homans, MD

## 2015-08-15 NOTE — Patient Instructions (Addendum)
Recommend calcium intake of 1200 to 1500 mg daily, divided into roughly 3 doses. Best source is the diet and a single dairy serving is about 500 mg, a supplement of calcium citrate once or twice daily to balance diet is fine if not getting enough in diet. Also need Vitamin D 2000 IU caps, 1 cap daily if not already taking vitamin D. Also recommend weight baring exercise on hips and upper body to keep bones strong. Cut off blue lights(chargers, computers  Etc.) 2 hour before bedtime to help with sleep.    Preventive Care for Adults, Female A healthy lifestyle and preventive care can promote health and wellness. Preventive health guidelines for women include the following key practices.  A routine yearly physical is a good way to check with your health care provider about your health and preventive screening. It is a chance to share any concerns and updates on your health and to receive a thorough exam.  Visit your dentist for a routine exam and preventive care every 6 months. Brush your teeth twice a day and floss once a day. Good oral hygiene prevents tooth decay and gum disease.  The frequency of eye exams is based on your age, health, family medical history, use of contact lenses, and other factors. Follow your health care provider's recommendations for frequency of eye exams.  Eat a healthy diet. Foods like vegetables, fruits, whole grains, low-fat dairy products, and lean protein foods contain the nutrients you need without too many calories. Decrease your intake of foods high in solid fats, added sugars, and salt. Eat the right amount of calories for you.Get information about a proper diet from your health care provider, if necessary.  Regular physical exercise is one of the most important things you can do for your health. Most adults should get at least 150 minutes of moderate-intensity exercise (any activity that increases your heart rate and causes you to sweat) each week. In addition, most  adults need muscle-strengthening exercises on 2 or more days a week.  Maintain a healthy weight. The body mass index (BMI) is a screening tool to identify possible weight problems. It provides an estimate of body fat based on height and weight. Your health care provider can find your BMI and can help you achieve or maintain a healthy weight.For adults 20 years and older:  A BMI below 18.5 is considered underweight.  A BMI of 18.5 to 24.9 is normal.  A BMI of 25 to 29.9 is considered overweight.  A BMI of 30 and above is considered obese.  Maintain normal blood lipids and cholesterol levels by exercising and minimizing your intake of saturated fat. Eat a balanced diet with plenty of fruit and vegetables. Blood tests for lipids and cholesterol should begin at age 32 and be repeated every 5 years. If your lipid or cholesterol levels are high, you are over 50, or you are at high risk for heart disease, you may need your cholesterol levels checked more frequently.Ongoing high lipid and cholesterol levels should be treated with medicines if diet and exercise are not working.  If you smoke, find out from your health care provider how to quit. If you do not use tobacco, do not start.  Lung cancer screening is recommended for adults aged 37-80 years who are at high risk for developing lung cancer because of a history of smoking. A yearly low-dose CT scan of the lungs is recommended for people who have at least a 30-pack-year history of smoking and  are a current smoker or have quit within the past 15 years. A pack year of smoking is smoking an average of 1 pack of cigarettes a day for 1 year (for example: 1 pack a day for 30 years or 2 packs a day for 15 years). Yearly screening should continue until the smoker has stopped smoking for at least 15 years. Yearly screening should be stopped for people who develop a health problem that would prevent them from having lung cancer treatment.  If you are pregnant,  do not drink alcohol. If you are breastfeeding, be very cautious about drinking alcohol. If you are not pregnant and choose to drink alcohol, do not have more than 1 drink per day. One drink is considered to be 12 ounces (355 mL) of beer, 5 ounces (148 mL) of wine, or 1.5 ounces (44 mL) of liquor.  Avoid use of street drugs. Do not share needles with anyone. Ask for help if you need support or instructions about stopping the use of drugs.  High blood pressure causes heart disease and increases the risk of stroke. Your blood pressure should be checked at least every 1 to 2 years. Ongoing high blood pressure should be treated with medicines if weight loss and exercise do not work.  If you are 54-94 years old, ask your health care provider if you should take aspirin to prevent strokes.  Diabetes screening is done by taking a blood sample to check your blood glucose level after you have not eaten for a certain period of time (fasting). If you are not overweight and you do not have risk factors for diabetes, you should be screened once every 3 years starting at age 69. If you are overweight or obese and you are 7-1 years of age, you should be screened for diabetes every year as part of your cardiovascular risk assessment.  Breast cancer screening is essential preventive care for women. You should practice "breast self-awareness." This means understanding the normal appearance and feel of your breasts and may include breast self-examination. Any changes detected, no matter how small, should be reported to a health care provider. Women in their 8s and 30s should have a clinical breast exam (CBE) by a health care provider as part of a regular health exam every 1 to 3 years. After age 43, women should have a CBE every year. Starting at age 6, women should consider having a mammogram (breast X-ray test) every year. Women who have a family history of breast cancer should talk to their health care provider about  genetic screening. Women at a high risk of breast cancer should talk to their health care providers about having an MRI and a mammogram every year.  Breast cancer gene (BRCA)-related cancer risk assessment is recommended for women who have family members with BRCA-related cancers. BRCA-related cancers include breast, ovarian, tubal, and peritoneal cancers. Having family members with these cancers may be associated with an increased risk for harmful changes (mutations) in the breast cancer genes BRCA1 and BRCA2. Results of the assessment will determine the need for genetic counseling and BRCA1 and BRCA2 testing.  Your health care provider may recommend that you be screened regularly for cancer of the pelvic organs (ovaries, uterus, and vagina). This screening involves a pelvic examination, including checking for microscopic changes to the surface of your cervix (Pap test). You may be encouraged to have this screening done every 3 years, beginning at age 34.  For women ages 43-65, health care providers may  recommend pelvic exams and Pap testing every 3 years, or they may recommend the Pap and pelvic exam, combined with testing for human papilloma virus (HPV), every 5 years. Some types of HPV increase your risk of cervical cancer. Testing for HPV may also be done on women of any age with unclear Pap test results.  Other health care providers may not recommend any screening for nonpregnant women who are considered low risk for pelvic cancer and who do not have symptoms. Ask your health care provider if a screening pelvic exam is right for you.  If you have had past treatment for cervical cancer or a condition that could lead to cancer, you need Pap tests and screening for cancer for at least 20 years after your treatment. If Pap tests have been discontinued, your risk factors (such as having a new sexual partner) need to be reassessed to determine if screening should resume. Some women have medical problems  that increase the chance of getting cervical cancer. In these cases, your health care provider may recommend more frequent screening and Pap tests.  Colorectal cancer can be detected and often prevented. Most routine colorectal cancer screening begins at the age of 60 years and continues through age 68 years. However, your health care provider may recommend screening at an earlier age if you have risk factors for colon cancer. On a yearly basis, your health care provider may provide home test kits to check for hidden blood in the stool. Use of a small camera at the end of a tube, to directly examine the colon (sigmoidoscopy or colonoscopy), can detect the earliest forms of colorectal cancer. Talk to your health care provider about this at age 64, when routine screening begins. Direct exam of the colon should be repeated every 5-10 years through age 35 years, unless early forms of precancerous polyps or small growths are found.  People who are at an increased risk for hepatitis B should be screened for this virus. You are considered at high risk for hepatitis B if:  You were born in a country where hepatitis B occurs often. Talk with your health care provider about which countries are considered high risk.  Your parents were born in a high-risk country and you have not received a shot to protect against hepatitis B (hepatitis B vaccine).  You have HIV or AIDS.  You use needles to inject street drugs.  You live with, or have sex with, someone who has hepatitis B.  You get hemodialysis treatment.  You take certain medicines for conditions like cancer, organ transplantation, and autoimmune conditions.  Hepatitis C blood testing is recommended for all people born from [birth year] through 1964 and any individual with known risks for hepatitis C.  Practice safe sex. Use condoms and avoid high-risk sexual practices to reduce the spread of sexually transmitted infections (STIs). STIs include gonorrhea,  chlamydia, syphilis, trichomonas, herpes, HPV, and human immunodeficiency virus (HIV). Herpes, HIV, and HPV are viral illnesses that have no cure. They can result in disability, cancer, and death.  You should be screened for sexually transmitted illnesses (STIs) including gonorrhea and chlamydia if:  You are sexually active and are younger than 24 years.  You are older than 24 years and your health care provider tells you that you are at risk for this type of infection.  Your sexual activity has changed since you were last screened and you are at an increased risk for chlamydia or gonorrhea. Ask your health care provider if you  are at risk.  If you are at risk of being infected with HIV, it is recommended that you take a prescription medicine daily to prevent HIV infection. This is called preexposure prophylaxis (PrEP). You are considered at risk if:  You are sexually active and do not regularly use condoms or know the HIV status of your partner(s).  You take drugs by injection.  You are sexually active with a partner who has HIV.  Talk with your health care provider about whether you are at high risk of being infected with HIV. If you choose to begin PrEP, you should first be tested for HIV. You should then be tested every 3 months for as long as you are taking PrEP.  Osteoporosis is a disease in which the bones lose minerals and strength with aging. This can result in serious bone fractures or breaks. The risk of osteoporosis can be identified using a bone density scan. Women ages 35 years and over and women at risk for fractures or osteoporosis should discuss screening with their health care providers. Ask your health care provider whether you should take a calcium supplement or vitamin D to reduce the rate of osteoporosis.  Menopause can be associated with physical symptoms and risks. Hormone replacement therapy is available to decrease symptoms and risks. You should talk to your health  care provider about whether hormone replacement therapy is right for you.  Use sunscreen. Apply sunscreen liberally and repeatedly throughout the day. You should seek shade when your shadow is shorter than you. Protect yourself by wearing long sleeves, pants, a wide-brimmed hat, and sunglasses year round, whenever you are outdoors.  Once a month, do a whole body skin exam, using a mirror to look at the skin on your back. Tell your health care provider of new moles, moles that have irregular borders, moles that are larger than a pencil eraser, or moles that have changed in shape or color.  Stay current with required vaccines (immunizations).  Influenza vaccine. All adults should be immunized every year.  Tetanus, diphtheria, and acellular pertussis (Td, Tdap) vaccine. Pregnant women should receive 1 dose of Tdap vaccine during each pregnancy. The dose should be obtained regardless of the length of time since the last dose. Immunization is preferred during the 27th-36th week of gestation. An adult who has not previously received Tdap or who does not know her vaccine status should receive 1 dose of Tdap. This initial dose should be followed by tetanus and diphtheria toxoids (Td) booster doses every 10 years. Adults with an unknown or incomplete history of completing a 3-dose immunization series with Td-containing vaccines should begin or complete a primary immunization series including a Tdap dose. Adults should receive a Td booster every 10 years.  Varicella vaccine. An adult without evidence of immunity to varicella should receive 2 doses or a second dose if she has previously received 1 dose. Pregnant females who do not have evidence of immunity should receive the first dose after pregnancy. This first dose should be obtained before leaving the health care facility. The second dose should be obtained 4-8 weeks after the first dose.  Human papillomavirus (HPV) vaccine. Females aged 13-26 years who have  not received the vaccine previously should obtain the 3-dose series. The vaccine is not recommended for use in pregnant females. However, pregnancy testing is not needed before receiving a dose. If a female is found to be pregnant after receiving a dose, no treatment is needed. In that case, the remaining doses  should be delayed until after the pregnancy. Immunization is recommended for any person with an immunocompromised condition through the age of 2 years if she did not get any or all doses earlier. During the 3-dose series, the second dose should be obtained 4-8 weeks after the first dose. The third dose should be obtained 24 weeks after the first dose and 16 weeks after the second dose.  Zoster vaccine. One dose is recommended for adults aged 60 years or older unless certain conditions are present.  Measles, mumps, and rubella (MMR) vaccine. Adults born before [birth year] generally are considered immune to measles and mumps. Adults born in [birth year] or later should have 1 or more doses of MMR vaccine unless there is a contraindication to the vaccine or there is laboratory evidence of immunity to each of the three diseases. A routine second dose of MMR vaccine should be obtained at least 28 days after the first dose for students attending postsecondary schools, health care workers, or international travelers. People who received inactivated measles vaccine or an unknown type of measles vaccine during 1963-1967 should receive 2 doses of MMR vaccine. People who received inactivated mumps vaccine or an unknown type of mumps vaccine before 1979 and are at high risk for mumps infection should consider immunization with 2 doses of MMR vaccine. For females of childbearing age, rubella immunity should be determined. If there is no evidence of immunity, females who are not pregnant should be vaccinated. If there is no evidence of immunity, females who are pregnant should delay immunization until after pregnancy. Unvaccinated  health care workers born before [birth year] who lack laboratory evidence of measles, mumps, or rubella immunity or laboratory confirmation of disease should consider measles and mumps immunization with 2 doses of MMR vaccine or rubella immunization with 1 dose of MMR vaccine.  Pneumococcal 13-valent conjugate (PCV13) vaccine. When indicated, a person who is uncertain of his immunization history and has no record of immunization should receive the PCV13 vaccine. All adults 23 years of age and older should receive this vaccine. An adult aged 59 years or older who has certain medical conditions and has not been previously immunized should receive 1 dose of PCV13 vaccine. This PCV13 should be followed with a dose of pneumococcal polysaccharide (PPSV23) vaccine. Adults who are at high risk for pneumococcal disease should obtain the PPSV23 vaccine at least 8 weeks after the dose of PCV13 vaccine. Adults older than 59 years of age who have normal immune system function should obtain the PPSV23 vaccine dose at least 1 year after the dose of PCV13 vaccine.  Pneumococcal polysaccharide (PPSV23) vaccine. When PCV13 is also indicated, PCV13 should be obtained first. All adults aged 47 years and older should be immunized. An adult younger than age 70 years who has certain medical conditions should be immunized. Any person who resides in a nursing home or long-term care facility should be immunized. An adult smoker should be immunized. People with an immunocompromised condition and certain other conditions should receive both PCV13 and PPSV23 vaccines. People with human immunodeficiency virus (HIV) infection should be immunized as soon as possible after diagnosis. Immunization during chemotherapy or radiation therapy should be avoided. Routine use of PPSV23 vaccine is not recommended for American Indians, [place] Natives, or people younger than 65 years unless there are medical conditions that require PPSV23 vaccine. When indicated,  people who have unknown immunization and have no record of immunization should receive PPSV23 vaccine. One-time revaccination 5 years after the first dose of PPSV23  is recommended for people aged 19-64 years who have chronic kidney failure, nephrotic syndrome, asplenia, or immunocompromised conditions. People who received 1-2 doses of PPSV23 before age 57 years should receive another dose of PPSV23 vaccine at age 12 years or later if at least 5 years have passed since the previous dose. Doses of PPSV23 are not needed for people immunized with PPSV23 at or after age 67 years.  Meningococcal vaccine. Adults with asplenia or persistent complement component deficiencies should receive 2 doses of quadrivalent meningococcal conjugate (MenACWY-D) vaccine. The doses should be obtained at least 2 months apart. Microbiologists working with certain meningococcal bacteria, [employer] recruits, people at risk during an outbreak, and people who travel to or live in countries with a high rate of meningitis should be immunized. A first-year college student up through age 30 years who is living in a residence hall should receive a dose if she did not receive a dose on or after her 16th birthday. Adults who have certain high-risk conditions should receive one or more doses of vaccine.  Hepatitis A vaccine. Adults who wish to be protected from this disease, have certain high-risk conditions, work with hepatitis A-infected animals, work in hepatitis A research labs, or travel to or work in countries with a high rate of hepatitis A should be immunized. Adults who were previously unvaccinated and who anticipate close contact with an international adoptee during the first 60 days after arrival in the [place] from a country with a high rate of hepatitis A should be immunized.  Hepatitis B vaccine. Adults who wish to be protected from this disease, have certain high-risk conditions, may be exposed to blood or other infectious body  fluids, are household contacts or sex partners of hepatitis B positive people, are clients or workers in certain care facilities, or travel to or work in countries with a high rate of hepatitis B should be immunized.  Haemophilus influenzae type b (Hib) vaccine. A previously unvaccinated person with asplenia or sickle cell disease or having a scheduled splenectomy should receive 1 dose of Hib vaccine. Regardless of previous immunization, a recipient of a hematopoietic stem cell transplant should receive a 3-dose series 6-12 months after her successful transplant. Hib vaccine is not recommended for adults with HIV infection. Preventive Services / Frequency Ages 10 to 82 years  Blood pressure check.** / Every 3-5 years.  Lipid and cholesterol check.** / Every 5 years beginning at age 31.  Clinical breast exam.** / Every 3 years for women in their 82s and 72s.  BRCA-related cancer risk assessment.** / For women who have family members with a BRCA-related cancer (breast, ovarian, tubal, or peritoneal cancers).  Pap test.** / Every 2 years from ages 62 through 69. Every 3 years starting at age 75 through age 47 or 32 with a history of 3 consecutive normal Pap tests.  HPV screening.** / Every 3 years from ages 67 through ages 38 to 8 with a history of 3 consecutive normal Pap tests.  Hepatitis C blood test.** / For any individual with known risks for hepatitis C.  Skin self-exam. / Monthly.  Influenza vaccine. / Every year.  Tetanus, diphtheria, and acellular pertussis (Tdap, Td) vaccine.** / Consult your health care provider. Pregnant women should receive 1 dose of Tdap vaccine during each pregnancy. 1 dose of Td every 10 years.  Varicella vaccine.** / Consult your health care provider. Pregnant females who do not have evidence of immunity should receive the first dose after pregnancy.  HPV  vaccine. / 3 doses over 6 months, if 20 and younger. The vaccine is not recommended for use in pregnant  females. However, pregnancy testing is not needed before receiving a dose.  Measles, mumps, rubella (MMR) vaccine.** / You need at least 1 dose of MMR if you were born in 1957 or later. You may also need a 2nd dose. For females of childbearing age, rubella immunity should be determined. If there is no evidence of immunity, females who are not pregnant should be vaccinated. If there is no evidence of immunity, females who are pregnant should delay immunization until after pregnancy.  Pneumococcal 13-valent conjugate (PCV13) vaccine.** / Consult your health care provider.  Pneumococcal polysaccharide (PPSV23) vaccine.** / 1 to 2 doses if you smoke cigarettes or if you have certain conditions.  Meningococcal vaccine.** / 1 dose if you are age 68 to 104 years and a Market researcher living in a residence hall, or have one of several medical conditions, you need to get vaccinated against meningococcal disease. You may also need additional booster doses.  Hepatitis A vaccine.** / Consult your health care provider.  Hepatitis B vaccine.** / Consult your health care provider.  Haemophilus influenzae type b (Hib) vaccine.** / Consult your health care provider. Ages 61 to 69 years  Blood pressure check.** / Every year.  Lipid and cholesterol check.** / Every 5 years beginning at age 33 years.  Lung cancer screening. / Every year if you are aged 64-80 years and have a 30-pack-year history of smoking and currently smoke or have quit within the past 15 years. Yearly screening is stopped once you have quit smoking for at least 15 years or develop a health problem that would prevent you from having lung cancer treatment.  Clinical breast exam.** / Every year after age 45 years.  BRCA-related cancer risk assessment.** / For women who have family members with a BRCA-related cancer (breast, ovarian, tubal, or peritoneal cancers).  Mammogram.** / Every year beginning at age 45 years and continuing  for as long as you are in good health. Consult with your health care provider.  Pap test.** / Every 3 years starting at age 40 years through age 47 or 67 years with a history of 3 consecutive normal Pap tests.  HPV screening.** / Every 3 years from ages 89 years through ages 58 to 75 years with a history of 3 consecutive normal Pap tests.  Fecal occult blood test (FOBT) of stool. / Every year beginning at age 66 years and continuing until age 27 years. You may not need to do this test if you get a colonoscopy every 10 years.  Flexible sigmoidoscopy or colonoscopy.** / Every 5 years for a flexible sigmoidoscopy or every 10 years for a colonoscopy beginning at age 63 years and continuing until age 71 years.  Hepatitis C blood test.** / For all people born from [birth year] through 1964 and any individual with known risks for hepatitis C.  Skin self-exam. / Monthly.  Influenza vaccine. / Every year.  Tetanus, diphtheria, and acellular pertussis (Tdap/Td) vaccine.** / Consult your health care provider. Pregnant women should receive 1 dose of Tdap vaccine during each pregnancy. 1 dose of Td every 10 years.  Varicella vaccine.** / Consult your health care provider. Pregnant females who do not have evidence of immunity should receive the first dose after pregnancy.  Zoster vaccine.** / 1 dose for adults aged 89 years or older.  Measles, mumps, rubella (MMR) vaccine.** / You need at least 1  dose of MMR if you were born in 1957 or later. You may also need a second dose. For females of childbearing age, rubella immunity should be determined. If there is no evidence of immunity, females who are not pregnant should be vaccinated. If there is no evidence of immunity, females who are pregnant should delay immunization until after pregnancy.  Pneumococcal 13-valent conjugate (PCV13) vaccine.** / Consult your health care provider.  Pneumococcal polysaccharide (PPSV23) vaccine.** / 1 to 2 doses if you smoke  cigarettes or if you have certain conditions.  Meningococcal vaccine.** / Consult your health care provider.  Hepatitis A vaccine.** / Consult your health care provider.  Hepatitis B vaccine.** / Consult your health care provider.  Haemophilus influenzae type b (Hib) vaccine.** / Consult your health care provider. Ages 39 years and over  Blood pressure check.** / Every year.  Lipid and cholesterol check.** / Every 5 years beginning at age 12 years.  Lung cancer screening. / Every year if you are aged 22-80 years and have a 30-pack-year history of smoking and currently smoke or have quit within the past 15 years. Yearly screening is stopped once you have quit smoking for at least 15 years or develop a health problem that would prevent you from having lung cancer treatment.  Clinical breast exam.** / Every year after age 61 years.  BRCA-related cancer risk assessment.** / For women who have family members with a BRCA-related cancer (breast, ovarian, tubal, or peritoneal cancers).  Mammogram.** / Every year beginning at age 52 years and continuing for as long as you are in good health. Consult with your health care provider.  Pap test.** / Every 3 years starting at age 61 years through age 68 or 50 years with 3 consecutive normal Pap tests. Testing can be stopped between 65 and 70 years with 3 consecutive normal Pap tests and no abnormal Pap or HPV tests in the past 10 years.  HPV screening.** / Every 3 years from ages 58 years through ages 47 or 75 years with a history of 3 consecutive normal Pap tests. Testing can be stopped between 65 and 70 years with 3 consecutive normal Pap tests and no abnormal Pap or HPV tests in the past 10 years.  Fecal occult blood test (FOBT) of stool. / Every year beginning at age 33 years and continuing until age 17 years. You may not need to do this test if you get a colonoscopy every 10 years.  Flexible sigmoidoscopy or colonoscopy.** / Every 5 years for a  flexible sigmoidoscopy or every 10 years for a colonoscopy beginning at age 55 years and continuing until age 67 years.  Hepatitis C blood test.** / For all people born from [birth year] through 1964 and any individual with known risks for hepatitis C.  Osteoporosis screening.** / A one-time screening for women ages 53 years and over and women at risk for fractures or osteoporosis.  Skin self-exam. / Monthly.  Influenza vaccine. / Every year.  Tetanus, diphtheria, and acellular pertussis (Tdap/Td) vaccine.** / 1 dose of Td every 10 years.  Varicella vaccine.** / Consult your health care provider.  Zoster vaccine.** / 1 dose for adults aged 86 years or older.  Pneumococcal 13-valent conjugate (PCV13) vaccine.** / Consult your health care provider.  Pneumococcal polysaccharide (PPSV23) vaccine.** / 1 dose for all adults aged 80 years and older.  Meningococcal vaccine.** / Consult your health care provider.  Hepatitis A vaccine.** / Consult your health care provider.  Hepatitis B vaccine.** /  Consult your health care provider.  Haemophilus influenzae type b (Hib) vaccine.** / Consult your health care provider. ** Family history and personal history of risk and conditions may change your health care provider's recommendations.   This information is not intended to replace advice given to you by your health care provider. Make sure you discuss any questions you have with your health care provider.   Document Released: 05/28/2001 Document Revised: 04/22/2014 Document Reviewed: 08/27/2010 Elsevier Interactive Patient Education [employer].

## 2015-08-27 ENCOUNTER — Encounter: Payer: Self-pay | Admitting: Family Medicine

## 2015-08-27 NOTE — Assessment & Plan Note (Signed)
Struggles with shoulder and hip pain as well. Encouraged to stay as active as tolerated and use topical treatments as needed

## 2015-08-27 NOTE — Assessment & Plan Note (Signed)
Improving but has been present since a recent fall when she hit her head. No associated symptoms. Should continue to improve. Will report if it does not

## 2016-02-24 ENCOUNTER — Ambulatory Visit (HOSPITAL_COMMUNITY)
Admission: EM | Admit: 2016-02-24 | Discharge: 2016-02-24 | Disposition: A | Discharge status: Home / Self Care | Payer: Managed Care, Other (non HMO) | Attending: Family Medicine | Admitting: Family Medicine

## 2016-02-24 ENCOUNTER — Encounter (HOSPITAL_COMMUNITY): Payer: Self-pay | Admitting: Emergency Medicine

## 2016-02-24 DIAGNOSIS — H1033 Unspecified acute conjunctivitis, bilateral: Secondary | ICD-10-CM

## 2016-02-24 LAB — BASIC METABOLIC PANEL: GLUCOSE: 181 mg/dL

## 2016-02-24 MED ORDER — OFLOXACIN 0.3 % OP SOLN: 1.0000 [drp] | Freq: Four times a day (QID) | OPHTHALMIC | 0 refills | Status: AC

## 2016-02-24 NOTE — ED Triage Notes (Signed)
Pt here for left eye soreness onset yest associated w/redness and swelling  Denies fevers, inj/trauma  A&O x4... NAD

## 2016-02-24 NOTE — ED Provider Notes (Signed)
[facility]    CSN: [id number] Arrival date & time: 02/24/16  1901     History   Chief Complaint Chief Complaint  Patient presents with  . Conjunctivitis    HPI Ellen Morrow is a 60 y.o. female.   The history is provided by the patient. No language interpreter was used.  Conjunctivitis  This is a new problem. The current episode started yesterday. The problem occurs constantly. The problem has been gradually worsening (Started with left eye redness, pain and discharge and this went to the right eye today). Pertinent negatives include no headaches. Associated symptoms comments: No fever. No exposure to someone with eye infection. Nothing aggravates the symptoms. Nothing relieves the symptoms. The treatment provided no (she used OTC eye drops) relief.    Past Medical History:  Diagnosis Date  . Arthritis of right knee 02/07/2013  . Chicken pox as a child  . Headache 06/07/2014  . Hx: UTI (urinary tract infection) 02/07/2013  . Hyperlipidemia, mixed 08/21/2014  . Insomnia 08/15/2015  . Measles as a child  . Mumps as a child  . Osteoarthritis of both knees 02/07/2013  . Preventative health care 02/07/2013  . TMJ syndrome   . UTI (urinary tract infection)     Patient Active Problem List   Diagnosis Date Noted  . Insomnia 08/15/2015  . Hyperlipidemia, mixed 08/21/2014  . Headache 06/07/2014  . Preventative health care 02/07/2013  . Hx: UTI (urinary tract infection) 02/07/2013  . Osteoarthritis of both knees 02/07/2013    Past Surgical History:  Procedure Laterality Date  . APPENDECTOMY  3 rd grade  . MOLE REMOVAL     benign- mole on right thigh  . scar tissue break up  2006   Dr Alfred Levins  . WISDOM TOOTH EXTRACTION  as a teenager    OB History    Gravida Para Term Preterm AB Living   2 2       2    SAB TAB Ectopic Multiple Live Births                   Home Medications    Prior to Admission medications   Medication Sig Start Date End Date  Taking? Authorizing Provider  Calcium Carbonate-Vitamin D (CALCIUM 600+D) 600-400 MG-UNIT tablet Take 1 tablet by mouth daily.   Yes Historical Provider, MD  estradiol (ESTRACE) 0.1 MG/GM vaginal cream Use 1/2 g vaginally two or three times per week as needed to maintain symptom relief. 05/29/15  Yes Brook E Yisroel Ramming, MD  KRILL OIL PO Take by mouth.   Yes Historical Provider, MD  Magnesium 250 MG TABS Take by mouth daily.   Yes Historical Provider, MD  LIDODERM 5 %  03/21/14   Historical Provider, MD  Melatonin 5 MG TABS Take 1 tablet by mouth at bedtime.    Historical Provider, MD    Family History Family History  Problem Relation Age of Onset  . Osteoporosis Mother   . Diabetes Mother     type 2  . Cancer Mother 45    stomach  . Colon polyps Mother     benign  . Colon cancer Mother 75    Tumor in colon removed Stage 2  . Hypertension Father   . Hyperlipidemia Father   . Other Sister     herniated disk in back, low blood pressure  . Hernia Brother     X 2  . Hypertension Sister   . Depression Sister   .  Cancer Maternal Grandmother     ? lung vs breast  . Cancer Maternal Aunt     Social History Social History  Substance Use Topics  . Smoking status: Never Smoker  . Smokeless tobacco: Never Used  . Alcohol use 0.6 oz/week    1 Standard drinks or equivalent per week     Comment: occasional wine     Allergies   Penicillins   Review of Systems Review of Systems  HENT: Negative for sneezing.        Red eyes  Respiratory: Negative.   Cardiovascular: Negative.   Gastrointestinal: Negative.   Neurological: Negative for headaches.  All other systems reviewed and are negative.    Physical Exam Triage Vital Signs ED Triage Vitals  Enc Vitals Group     BP 02/24/16 1943 111/59     Pulse Rate 02/24/16 1943 67     Resp 02/24/16 1943 16     Temp 02/24/16 1943 98.1 F (36.7 C)     Temp Source 02/24/16 1943 Oral     SpO2 02/24/16 1943 99 %     Weight --       Height --      Head Circumference --      Peak Flow --      Pain Score 02/24/16 1942 5     Pain Loc --      Pain Edu? --      Excl. in [place]? --    No data found.   Updated Vital Signs BP 111/59 (BP Location: Left Arm)   Pulse 67   Temp 98.1 F (36.7 C) (Oral)   Resp 16   LMP 04/15/2008   SpO2 99%   Visual Acuity Right Eye Distance:   Left Eye Distance:   Bilateral Distance:    Right Eye Near:   Left Eye Near:    Bilateral Near:     Physical Exam  Constitutional: No distress.  HENT:  Head: Normocephalic.  Right Ear: Tympanic membrane normal.  Left Ear: Tympanic membrane normal.  Mouth/Throat: Uvula is midline, oropharynx is clear and moist and mucous membranes are normal.  Eyes: EOM and lids are normal. Pupils are equal, round, and reactive to light. Right conjunctiva is injected. Left conjunctiva is injected.    Finger counting normal B/L and on left and right  Cardiovascular: Normal rate, regular rhythm and normal heart sounds.   No murmur heard. Pulmonary/Chest: Effort normal and breath sounds normal. She has no wheezes.  Nursing note and vitals reviewed.    UC Treatments / Results  Labs (all labs ordered are listed, but only abnormal results are displayed) Labs Reviewed - No data to display  EKG  EKG Interpretation None       Radiology No results found.  Procedures Procedures (including critical care time)  Medications Ordered in UC Medications - No data to display   Initial Impression / Assessment and Plan / UC Course  I have reviewed the triage vital signs and the nursing notes.  Pertinent labs & imaging results that were available during my care of the patient were reviewed by me and considered in my medical decision making (see chart for details).  Clinical Course     Likely viral conjunctivitis. She however gave hx of similar presentation with bacterial conjunctivitis. No visual loss. Ophthalmic Ofloxacin prescribed. May use OTC  visine to clear conjunctiva. Return precaution discussed.  Final Clinical Impressions(s) / UC Diagnoses   Final diagnoses:  None  New Prescriptions New Prescriptions   No medications on file     Kinnie Feil, MD 02/24/16 2020

## 2016-02-24 NOTE — Discharge Instructions (Signed)
It was nice seeing you today. You do have mild conjunctivitis. Likely viral. Topical A/B prescribed to prevent super imposed bacterial infection. Please go to the ED if having vision problem or symptoms worsening.

## 2016-04-15 HISTORY — PX: COLONOSCOPY: SHX174

## 2016-04-22 ENCOUNTER — Telehealth: Payer: Self-pay | Admitting: Obstetrics and Gynecology

## 2016-04-22 NOTE — Telephone Encounter (Signed)
Patient is ready to schedule her 1st colonoscopy and asking if we need to schedule this appointment.

## 2016-04-22 NOTE — Telephone Encounter (Signed)
Spoke with patient. Patient states she would like to schedule colonoscopy and does not remember where she went several years ago and if she can schedule or do we need to? Advised patient last colonoscopy was in 2008 by Dr. Delfin Edis at [place]. Provided patient with telephone number [phone] and advised to call directly to schedule screening colonoscopy. Patient verbalizes understanding and is aggreable.   Routing to provider for final review. Patient is agreeable to disposition. Will close encounter.

## 2016-04-30 ENCOUNTER — Encounter: Payer: Self-pay | Admitting: Gastroenterology

## 2016-06-06 ENCOUNTER — Ambulatory Visit: Payer: Managed Care, Other (non HMO) | Admitting: Gastroenterology

## 2016-06-06 NOTE — Progress Notes (Signed)
61 y.o. G2P2 Married Caucasian female here for annual exam.    Using vaginal estrogen cream to help prevent UTIs.   Complaining of urinary frequency and states she wears a pad all the time now. Leaks with running, cough, and sneeze.  Needs to use a pad every day, thinks especially if full.  No dysuria.  Doing fluid restriction starting at about 5 PM. Having pain in lower abdomen after voiding when she gets up in the middle of the night.   FH of cancers: Mother with colon cancer . Sister with precancerous polyps. Brother with precancerous polyps.  Maternal aunt - ovarian cancer.  Maternal aunt - breast cancer.  Mother - colon and stomach cancer. MGM - breast cancer and deceased in her 2s.   PCP:  Penni Homans, MD   Patient's last menstrual period was 04/15/2008.           Sexually active: Yes.  female  The current method of family planning is post menopausal status.    Exercising: Yes.    Aerobics, running and [place] Smoker:  no  Health Maintenance: Pap:  05-29-15 Neg:Neg HR HPV; 05-12-12 Neg:Neg HR HPV History of abnormal Pap:  no MMG:  06-21-15 Density B/Neg/BiRads2:[facility] Colonoscopy: 03/2007 normal with Dr. Sydell Axon Brodie:next colonoscopy due 03/2017.  BMD:  11-26[birth year]2000  Result: normal with Dr. Isaiah Blakes TDaP:  06/2013 Gardasil:   N/A HIV:  Donated blood last in 2002.  Hep C:  Will do today. Screening Labs:  Hb today: PCP, Urine today: Neg   reports that she has never smoked. She has never used smokeless tobacco. She reports that she drinks about 0.6 oz of alcohol per week . She reports that she does not use drugs.  Past Medical History:  Diagnosis Date  . Arthritis of right knee 02/07/2013  . Chicken pox as a child  . Headache 06/07/2014  . Hx: UTI (urinary tract infection) 02/07/2013  . Hyperlipidemia, mixed 08/21/2014  . Insomnia 08/15/2015  . Measles as a child  . Mumps as a child  . Osteoarthritis of both knees 02/07/2013  . Preventative health care 02/07/2013  . TMJ  syndrome   . UTI (urinary tract infection)     Past Surgical History:  Procedure Laterality Date  . APPENDECTOMY  3 rd grade  . MOLE REMOVAL     benign- mole on right thigh  . scar tissue break up  2006   Dr Alfred Levins  . WISDOM TOOTH EXTRACTION  as a teenager    Current Outpatient Prescriptions  Medication Sig Dispense Refill  . Calcium Carbonate-Vitamin D (CALCIUM 600+D) 600-400 MG-UNIT tablet Take 1 tablet by mouth daily.    Marland Kitchen estradiol (ESTRACE) 0.1 MG/GM vaginal cream Use 1/2 g vaginally two or three times per week as needed to maintain symptom relief. 42.5 g 2  . KRILL OIL PO Take by mouth.    Marland Kitchen LIDODERM 5 %   2  . Magnesium 250 MG TABS Take by mouth daily.    . Melatonin 5 MG TABS Take 1 tablet by mouth at bedtime as needed.      Current Facility-Administered Medications  Medication Dose Route Frequency Provider Last Rate Last Dose  . ondansetron (ZOFRAN-ODT) disintegrating tablet 4 mg  4 mg Oral Once Shawnee Knapp, MD       Facility-Administered Medications Ordered in Other Visits  Medication Dose Route Frequency Provider Last Rate Last Dose  . methylPREDNISolone acetate (DEPO-MEDROL) injection 40 mg  40 mg Intramuscular Once Luanna Cole  Hassell Done, PA-C        Family History  Problem Relation Age of Onset  . Osteoporosis Mother   . Diabetes Mother     type 2  . Cancer Mother 48    stomach  . Colon polyps Mother     benign  . Colon cancer Mother 43    Tumor in colon removed Stage 2  . Hypertension Father   . Hyperlipidemia Father   . Other Sister     herniated disk in back, low blood pressure  . Hernia Brother     X 2  . Hypertension Sister   . Depression Sister   . Cancer Maternal Grandmother     ? lung vs breast  . Cancer Maternal Aunt     ROS:  Pertinent items are noted in HPI.  Otherwise, a comprehensive ROS was negative.  Exam:   BP 110/76 (BP Location: Right Arm, Patient Position: Sitting, Cuff Size: Normal)   Pulse 60   Resp 16   Ht 5' 5.5"  (1.664 m)   Wt 148 lb 9.6 oz (67.4 kg)   LMP 04/15/2008   BMI 24.35 kg/m     General appearance: alert, cooperative and appears stated age Head: Normocephalic, without obvious abnormality, atraumatic Neck: no adenopathy, supple, symmetrical, trachea midline and thyroid normal to inspection and palpation Lungs: clear to auscultation bilaterally Breasts: normal appearance, no masses or tenderness, No nipple retraction or dimpling, No nipple discharge or bleeding, No axillary or supraclavicular adenopathy Heart: regular rate and rhythm Abdomen: soft, non-tender; no masses, no organomegaly Extremities: extremities normal, atraumatic, no cyanosis or edema Skin: Skin color, texture, turgor normal. No rashes or lesions Lymph nodes: Cervical, supraclavicular, and axillary nodes normal. No abnormal inguinal nodes palpated Neurologic: Grossly normal  Pelvic: External genitalia:  no lesions              Urethra:  normal appearing urethra with no masses, tenderness or lesions              Bartholins and Skenes: normal                 Vagina: normal appearing vagina with normal color and discharge, no lesions.  Good Kegel.               Cervix: no lesions              Pap taken: No. Bimanual Exam:  Uterus:  normal size, contour, position, consistency, mobility, non-tender              Adnexa: no mass, fullness, tenderness              Rectal exam: Yes.  .  Confirms.              Anus:  normal sphincter tone, no lesions  Chaperone was present for exam.  Assessment:   Well woman visit with normal exam. FH cancer of breast, ovary, stomach, and colon.  Stress incontinence.   Plan: Mammogram screening discussed. Recommended self breast awareness. Pap and HR HPV as above. Guidelines for Calcium, Vitamin D, regular exercise program including cardiovascular and weight bearing exercise. Refill of vaginal estrogen cream.   Patient understands the importance of yearly mammogram while using  estrogens. Referral to genetics specialist.  Referral to gastroenterology.  Information from [employer] about stress incontinence in general and about surgery for this. Reviewed Kegel exercises.  Will do Hep C aby today.  May return for fasting labs as well.  She will decide this if insurance will pay. Follow up annually and prn.       After visit summary provided.

## 2016-06-07 ENCOUNTER — Encounter: Payer: Self-pay | Admitting: Obstetrics and Gynecology

## 2016-06-07 ENCOUNTER — Ambulatory Visit (INDEPENDENT_AMBULATORY_CARE_PROVIDER_SITE_OTHER): Payer: Managed Care, Other (non HMO) | Admitting: Obstetrics and Gynecology

## 2016-06-07 VITALS — BP 110/76 | HR 60 | Resp 16 | Ht 65.5 in | Wt 148.6 lb

## 2016-06-07 DIAGNOSIS — Z1211 Encounter for screening for malignant neoplasm of colon: Secondary | ICD-10-CM | POA: Diagnosis not present

## 2016-06-07 DIAGNOSIS — Z01419 Encounter for gynecological examination (general) (routine) without abnormal findings: Secondary | ICD-10-CM

## 2016-06-07 DIAGNOSIS — Z803 Family history of malignant neoplasm of breast: Secondary | ICD-10-CM

## 2016-06-07 DIAGNOSIS — Z113 Encounter for screening for infections with a predominantly sexual mode of transmission: Secondary | ICD-10-CM | POA: Diagnosis not present

## 2016-06-07 DIAGNOSIS — Z8041 Family history of malignant neoplasm of ovary: Secondary | ICD-10-CM | POA: Diagnosis not present

## 2016-06-07 DIAGNOSIS — R350 Frequency of micturition: Secondary | ICD-10-CM | POA: Diagnosis not present

## 2016-06-07 DIAGNOSIS — N952 Postmenopausal atrophic vaginitis: Secondary | ICD-10-CM

## 2016-06-07 DIAGNOSIS — Z800 Family history of malignant neoplasm of digestive organs: Secondary | ICD-10-CM

## 2016-06-07 LAB — POCT URINALYSIS DIPSTICK
Bilirubin, UA: NEGATIVE
Blood, UA: NEGATIVE
GLUCOSE UA: NEGATIVE
Ketones, UA: NEGATIVE
LEUKOCYTES UA: NEGATIVE
NITRITE UA: NEGATIVE
Protein, UA: NEGATIVE
UROBILINOGEN UA: NEGATIVE
pH, UA: 5

## 2016-06-07 LAB — HEPATITIS C ANTIBODY: HCV AB: NEGATIVE

## 2016-06-07 MED ORDER — ESTRADIOL 0.1 MG/GM VA CREA: TOPICAL_CREAM | VAGINAL | 2 refills | Status: DC

## 2016-06-07 NOTE — Patient Instructions (Signed)

## 2016-06-09 ENCOUNTER — Encounter: Payer: Self-pay | Admitting: Obstetrics and Gynecology

## 2016-06-16 ENCOUNTER — Telehealth: Payer: Self-pay | Admitting: Genetic Counselor

## 2016-06-16 ENCOUNTER — Encounter: Payer: Self-pay | Admitting: Genetic Counselor

## 2016-06-16 NOTE — Telephone Encounter (Signed)
Appt has been scheduled for the pt to see a genetic counselor on 3/12 at 10:15am. Pt aware to arrive 30 minutes early. Demographics verified. Letter mailed.

## 2016-06-21 LAB — HM MAMMOGRAPHY

## 2016-06-24 ENCOUNTER — Other Ambulatory Visit: Payer: Managed Care, Other (non HMO)

## 2016-06-24 ENCOUNTER — Telehealth: Payer: Self-pay | Admitting: Genetic Counselor

## 2016-06-24 NOTE — Telephone Encounter (Signed)
Pt cld to cancel genetics appt. I explained to her that I will give her a call back once the new schedule for the new genetic counselor comes open. Voiced understanding.

## 2016-07-01 ENCOUNTER — Telehealth: Payer: Self-pay | Admitting: Genetics

## 2016-07-01 NOTE — Telephone Encounter (Signed)
Appt has been scheduled to see Vicente Males for genetic counseling on 3/22 at 11am. Pt aware to arrive 15 minutes early. Agreed to the appt date and time.

## 2016-07-04 ENCOUNTER — Encounter: Payer: Managed Care, Other (non HMO) | Admitting: Genetics

## 2016-07-04 ENCOUNTER — Other Ambulatory Visit: Payer: Managed Care, Other (non HMO)

## 2016-07-08 ENCOUNTER — Encounter: Payer: Self-pay | Admitting: Family Medicine

## 2016-07-10 ENCOUNTER — Encounter: Payer: Self-pay | Admitting: Gastroenterology

## 2016-07-10 ENCOUNTER — Ambulatory Visit (INDEPENDENT_AMBULATORY_CARE_PROVIDER_SITE_OTHER): Payer: Managed Care, Other (non HMO) | Admitting: Gastroenterology

## 2016-07-10 VITALS — BP 108/72 | Ht 65.5 in | Wt 143.2 lb

## 2016-07-10 DIAGNOSIS — Z800 Family history of malignant neoplasm of digestive organs: Secondary | ICD-10-CM

## 2016-07-10 MED ORDER — NA SULFATE-K SULFATE-MG SULF 17.5-3.13-1.6 GM/177ML PO SOLN: 1.0000 | Freq: Once | ORAL | 0 refills | Status: AC

## 2016-07-10 NOTE — Patient Instructions (Addendum)
If you are age 60 or older, your body mass index should be between 23-30. Your Body mass index is 23.48 kg/m. If this is out of the aforementioned range listed, please consider follow up with your Primary Care Provider.  If you are age 16 or younger, your body mass index should be between 19-25. Your Body mass index is 23.48 kg/m. If this is out of the aformentioned range listed, please consider follow up with your Primary Care Provider.   You have been scheduled for a colonoscopy. Please follow written instructions given to you at your visit today.  Please pick up your prep supplies at the pharmacy within the next 1-3 days. If you use inhalers (even only as needed), please bring them with you on the day of your procedure. Your physician has requested that you go to www.startemmi.com and enter the access code given to you at your visit today. This web site gives a general overview about your procedure. However, you should still follow specific instructions given to you by our office regarding your preparation for the procedure.  We have sent the following medications to your pharmacy for you to pick up at your convenience:  Suprep

## 2016-07-10 NOTE — Progress Notes (Signed)
Ellen Morrow    [id number]    02-15-1956  Primary Care Physician:Stacey Charlett Blake, MD  Referring Physician: Mosie Lukes, MD [address] [place], [place] [address]  Chief complaint:  Family History of colon cancer  HPI: 61 yr F here to discuss colorectal cancer screening. Last colonoscopy in Dec 2008 by Dr Olevia Perches was normal with no polyps detected.  Her mother was diagnosed with colon cancer in the interim at age 60. She has had multiple polyps removed in her 60's (11 per patient) and was informed due to age further surveillance was not recommended. She also has a great aunt with history of colon cancer. Her siblings with multiple colon polyps. Denies any nausea, vomiting, abdominal pain, melena or bright red blood per rectum No change in bowel habits or specific GI complaints.     Outpatient Encounter Prescriptions as of 07/10/2016  Medication Sig  . Calcium Carbonate-Vitamin D (CALCIUM 600+D) 600-400 MG-UNIT tablet Take 1 tablet by mouth daily.  Marland Kitchen estradiol (ESTRACE) 0.1 MG/GM vaginal cream Use 1/2 g vaginally two or three times per week as needed to maintain symptom relief.  Marland Kitchen KRILL OIL PO Take by mouth.  Marland Kitchen LIDODERM 5 % Place 1 patch onto the skin. Pt applies as needed  . Magnesium 250 MG TABS Take by mouth daily.  . Melatonin 5 MG TABS Take 1 tablet by mouth at bedtime as needed.    Facility-Administered Encounter Medications as of 07/10/2016  Medication  . methylPREDNISolone acetate (DEPO-MEDROL) injection 40 mg  . ondansetron (ZOFRAN-ODT) disintegrating tablet 4 mg    Allergies as of 07/10/2016 - Review Complete 07/10/2016  Allergen Reaction Noted  . Penicillins Rash 08/07/2012    Past Medical History:  Diagnosis Date  . Arthritis of right knee 02/07/2013  . Chicken pox as a child  . Headache 06/07/2014  . Hx: UTI (urinary tract infection) 02/07/2013  . Hyperlipidemia, mixed 08/21/2014  . Insomnia 08/15/2015  . Measles as a child  . Mumps as a  child  . Osteoarthritis of both knees 02/07/2013  . Preventative health care 02/07/2013  . TMJ syndrome   . UTI (urinary tract infection)     Past Surgical History:  Procedure Laterality Date  . APPENDECTOMY  3 rd grade  . MOLE REMOVAL     benign- mole on right thigh  . scar tissue break up  2006   Dr Alfred Levins  . WISDOM TOOTH EXTRACTION  as a teenager    Family History  Problem Relation Age of Onset  . Osteoporosis Mother   . Diabetes Mother     type 2  . Cancer Mother 23    stomach  . Colon polyps Mother     benign  . Colon cancer Mother 65    Tumor in colon removed Stage 2  . Hypertension Father   . Hyperlipidemia Father   . Other Sister     herniated disk in back, low blood pressure  . Hernia Brother     X 2  . Hypertension Sister   . Depression Sister   . Cancer Maternal Grandmother     ? lung vs breast  . Cancer Maternal Aunt     Social History   Social History  . Marital status: Married    Spouse name: N/A  . Number of children: N/A  . Years of education: N/A   Occupational History  . Not on file.  Social History Main Topics  . Smoking status: Never Smoker  . Smokeless tobacco: Never Used  . Alcohol use 0.6 oz/week    1 Standard drinks or equivalent per week     Comment: occasional wine 2-3/month  . Drug use: No  . Sexual activity: Yes    Partners: Male    Birth control/ protection: Post-menopausal     Comment: lives with husband, exercising regularly, no dietary restrictions   Other Topics Concern  . Not on file   Social History Narrative  . No narrative on file      Review of systems: Review of Systems  Constitutional: Negative for fever and chills.  HENT: Negative.   Eyes: Negative for blurred vision.  Respiratory: Negative for cough, shortness of breath and wheezing.   Cardiovascular: Negative for chest pain and palpitations.  Gastrointestinal: as per HPI Genitourinary: Negative for dysuria, urgency, frequency and  hematuria.  Musculoskeletal: Negative for myalgias, back pain and joint pain.  Skin: Negative for itching and rash.  Neurological: Negative for dizziness, tremors, focal weakness, seizures and loss of consciousness.  Endo/Heme/Allergies: Positive for seasonal allergies.  Psychiatric/Behavioral: Negative for depression, suicidal ideas and hallucinations.  All other systems reviewed and are negative.   Physical Exam: Vitals:   07/10/16 0937  BP: 108/72   Body mass index is 23.48 kg/m. Gen:      No acute distress HEENT:  EOMI, sclera anicteric Neck:     No masses; no thyromegaly Lungs:    Clear to auscultation bilaterally; normal respiratory effort CV:         Regular rate and rhythm; no murmurs Abd:      + bowel sounds; soft, non-tender; no palpable masses, no distension Ext:    No edema; adequate peripheral perfusion Skin:      Warm and dry; no rash Neuro: alert and oriented x 3 Psych: normal mood and affect  Data Reviewed:  Reviewed labs, radiology imaging, old records and pertinent past GI work up   Assessment and Plan/Recommendations: 61 yr F with family history of colon cancer (Mother age 45), multiple family members with colon polyps and distant relative with colon cancer here to discuss colorectal cancer screening.  Will proceed with colonoscopy as she is due later this year for screening colonoscopy. The risks and benefits as well as alternatives of endoscopic procedure(s) have been discussed and reviewed. All questions answered. The patient agrees to proceed.    [name] , MD [phone] Mon-Fri 8a-5p [phone] after 5p, weekends, holidays  CC: Mosie Lukes, MD

## 2016-07-11 ENCOUNTER — Encounter: Payer: Self-pay | Admitting: Gastroenterology

## 2016-07-11 ENCOUNTER — Encounter: Payer: Self-pay | Admitting: Obstetrics and Gynecology

## 2016-07-24 ENCOUNTER — Encounter: Payer: Self-pay | Admitting: Gastroenterology

## 2016-07-24 ENCOUNTER — Other Ambulatory Visit: Payer: Managed Care, Other (non HMO)

## 2016-07-24 ENCOUNTER — Ambulatory Visit (AMBULATORY_SURGERY_CENTER): Payer: Managed Care, Other (non HMO) | Admitting: Gastroenterology

## 2016-07-24 VITALS — BP 102/68 | HR 63 | Temp 98.4°F | Resp 21 | Ht 65.0 in | Wt 143.0 lb

## 2016-07-24 DIAGNOSIS — Z1211 Encounter for screening for malignant neoplasm of colon: Secondary | ICD-10-CM

## 2016-07-24 DIAGNOSIS — Z800 Family history of malignant neoplasm of digestive organs: Secondary | ICD-10-CM

## 2016-07-24 DIAGNOSIS — Z1212 Encounter for screening for malignant neoplasm of rectum: Secondary | ICD-10-CM | POA: Diagnosis not present

## 2016-07-24 DIAGNOSIS — D124 Benign neoplasm of descending colon: Secondary | ICD-10-CM | POA: Diagnosis not present

## 2016-07-24 MED ORDER — SODIUM CHLORIDE 0.9 % IV SOLN: 500.0000 mL | INTRAVENOUS | Status: DC

## 2016-07-24 NOTE — Progress Notes (Signed)
To recovery, report to McCoy, RN, VSS 

## 2016-07-24 NOTE — Patient Instructions (Signed)
Discharge instructions given. Handout on polyps. Resume previous medications. Sent to lab to pick up kit for stool antigens. YOU HAD AN ENDOSCOPIC PROCEDURE TODAY AT [facility]:   Refer to the procedure report that was given to you for any specific questions about what was found during the examination.  If the procedure report does not answer your questions, please call your gastroenterologist to clarify.  If you requested that your care partner not be given the details of your procedure findings, then the procedure report has been included in a sealed envelope for you to review at your convenience later.  YOU SHOULD EXPECT: Some feelings of bloating in the abdomen. Passage of more gas than usual.  Walking can help get rid of the air that was put into your GI tract during the procedure and reduce the bloating. If you had a lower endoscopy (such as a colonoscopy or flexible sigmoidoscopy) you may notice spotting of blood in your stool or on the toilet paper. If you underwent a bowel prep for your procedure, you may not have a normal bowel movement for a few days.  Please Note:  You might notice some irritation and congestion in your nose or some drainage.  This is from the oxygen used during your procedure.  There is no need for concern and it should clear up in a day or so.  SYMPTOMS TO REPORT IMMEDIATELY:   Following lower endoscopy (colonoscopy or flexible sigmoidoscopy):  Excessive amounts of blood in the stool  Significant tenderness or worsening of abdominal pains  Swelling of the abdomen that is new, acute  Fever of 100F or higher   For urgent or emergent issues, a gastroenterologist can be reached at any hour by calling [phone].   DIET:  We do recommend a small meal at first, but then you may proceed to your regular diet.  Drink plenty of fluids but you should avoid alcoholic beverages for 24 hours.  ACTIVITY:  You should plan to take it easy for the rest of  today and you should NOT DRIVE or use heavy machinery until tomorrow (because of the sedation medicines used during the test).    FOLLOW UP: Our staff will call the number listed on your records the next business day following your procedure to check on you and address any questions or concerns that you may have regarding the information given to you following your procedure. If we do not reach you, we will leave a message.  However, if you are feeling well and you are not experiencing any problems, there is no need to return our call.  We will assume that you have returned to your regular daily activities without incident.  If any biopsies were taken you will be contacted by phone or by letter within the next 1-3 weeks.  Please call us at [phone] if you have not heard about the biopsies in 3 weeks.    SIGNATURES/CONFIDENTIALITY: You and/or your care partner have signed paperwork which will be entered into your electronic medical record.  These signatures attest to the fact that that the information above on your After Visit Summary has been reviewed and is understood.  Full responsibility of the confidentiality of this discharge information lies with you and/or your care-partner.

## 2016-07-24 NOTE — Progress Notes (Signed)
Called to room to assist during endoscopic procedure.  Patient ID and intended procedure confirmed with present staff. Received instructions for my participation in the procedure from the performing physician.  

## 2016-07-24 NOTE — Op Note (Signed)
[facility] Patient Name: Ellen Morrow Procedure Date: 07/24/2016 3:18 PM MRN: [id number] Endoscopist: Mauri Pole , MD Age: 61 Referring MD:  Date of Birth: 09-11-1955 Gender: Female Account #: [id number] Procedure:                Colonoscopy Indications:              Colon cancer screening in patient at increased                            risk: Family history of colon polyps in multiple                            1st-degree relatives, Screening patient at                            increased risk: Family history of 1st-degree                            relative with colorectal cancer at age 3 years (or                            older) Medicines:                Monitored Anesthesia Care Procedure:                Pre-Anesthesia Assessment:                           - Prior to the procedure, a History and Physical                            was performed, and patient medications and                            allergies were reviewed. The patient's tolerance of                            previous anesthesia was also reviewed. The risks                            and benefits of the procedure and the sedation                            options and risks were discussed with the patient.                            All questions were answered, and informed consent                            was obtained. Prior Anticoagulants: The patient has                            taken no previous anticoagulant or antiplatelet  agents. ASA Grade Assessment: II - A patient with                            mild systemic disease. After reviewing the risks                            and benefits, the patient was deemed in                            satisfactory condition to undergo the procedure.                           After obtaining informed consent, the colonoscope                            was passed under direct vision. Throughout the            procedure, the patient's blood pressure, pulse, and                            oxygen saturations were monitored continuously. The                            Colonoscope was introduced through the anus and                            advanced to the the terminal ileum, with                            identification of the appendiceal orifice and IC                            valve. The colonoscopy was performed without                            difficulty. The patient tolerated the procedure                            well. The quality of the bowel preparation was                            excellent. The terminal ileum, ileocecal valve,                            appendiceal orifice, and rectum were photographed. Scope In: 3:25:37 PM Scope Out: 3:40:00 PM Scope Withdrawal Time: 0 hours 6 minutes 51 seconds  Total Procedure Duration: 0 hours 14 minutes 23 seconds  Findings:                 The perianal and digital rectal examinations were                            normal.  A 1 mm polyp was found in the descending colon. The                            polyp was sessile. The polyp was removed with a                            cold biopsy forceps. Resection and retrieval were                            complete.                           Non-bleeding internal hemorrhoids were found during                            retroflexion. The hemorrhoids were small.                           The exam was otherwise without abnormality. Complications:            No immediate complications. Estimated Blood Loss:     Estimated blood loss was minimal. Impression:               - One 1 mm polyp in the descending colon, removed                            with a cold biopsy forceps. Resected and retrieved.                           - Non-bleeding internal hemorrhoids.                           - The examination was otherwise normal. Recommendation:           - Patient has a  contact number available for                            emergencies. The signs and symptoms of potential                            delayed complications were discussed with the                            patient. Return to normal activities tomorrow.                            Written discharge instructions were provided to the                            patient.                           - Resume previous diet.                           - Continue present medications.                           -  Await pathology results.                           - Repeat colonoscopy in 5 years for surveillance.                           - Check H.pylori stool antigen (Family history of                            gastric cancer, Mother at age 34) Mauri Pole, MD 07/24/2016 3:48:49 PM This report has been signed electronically.

## 2016-07-25 ENCOUNTER — Other Ambulatory Visit: Payer: Managed Care, Other (non HMO)

## 2016-07-25 ENCOUNTER — Telehealth: Payer: Self-pay

## 2016-07-25 ENCOUNTER — Encounter: Payer: Managed Care, Other (non HMO) | Admitting: Genetics

## 2016-07-25 NOTE — Telephone Encounter (Signed)
  Follow up Call-  Call back number 07/24/2016  Post procedure Call Back phone  # [phone]  Permission to leave phone message Yes  Some recent data might be hidden     Patient questions:  Do you have a fever, pain , or abdominal swelling? No. Pain Score  0 *  Have you tolerated food without any problems? Yes.    Have you been able to return to your normal activities? Yes.    Do you have any questions about your discharge instructions: Diet   No. Medications  No. Follow up visit  No.  Do you have questions or concerns about your Care? No.  Actions: * If pain score is 4 or above: No action needed, pain <4.

## 2016-07-30 ENCOUNTER — Other Ambulatory Visit: Payer: Managed Care, Other (non HMO)

## 2016-07-30 DIAGNOSIS — Z800 Family history of malignant neoplasm of digestive organs: Secondary | ICD-10-CM

## 2016-07-31 LAB — HELICOBACTER PYLORI  SPECIAL ANTIGEN: H. PYLORI ANTIGEN STOOL: NOT DETECTED

## 2016-08-05 ENCOUNTER — Encounter: Payer: Self-pay | Admitting: Gastroenterology

## 2016-08-15 ENCOUNTER — Encounter: Payer: Managed Care, Other (non HMO) | Admitting: Family Medicine

## 2016-10-11 ENCOUNTER — Encounter: Payer: Self-pay | Admitting: Medical

## 2016-10-11 ENCOUNTER — Ambulatory Visit (INDEPENDENT_AMBULATORY_CARE_PROVIDER_SITE_OTHER): Payer: Managed Care, Other (non HMO) | Admitting: Medical

## 2016-10-11 VITALS — BP 107/71 | HR 71 | Temp 98.3°F | Resp 16 | Ht 65.0 in | Wt 148.8 lb

## 2016-10-11 DIAGNOSIS — J0100 Acute maxillary sinusitis, unspecified: Secondary | ICD-10-CM | POA: Diagnosis not present

## 2016-10-11 DIAGNOSIS — R0981 Nasal congestion: Secondary | ICD-10-CM

## 2016-10-11 MED ORDER — FLUTICASONE PROPIONATE 50 MCG/ACT NA SUSP: 2.0000 | Freq: Every day | NASAL | 0 refills | Status: DC

## 2016-10-11 MED ORDER — AZITHROMYCIN 250 MG PO TABS: ORAL_TABLET | ORAL | 0 refills | Status: DC

## 2016-10-11 NOTE — Progress Notes (Signed)
Subjective:    Patient ID: Ellen Morrow, female    DOB: Aug 15, 1955, 61 y.o.   MRN: [id number]  HPI  Pt in with some nasal congestion, sinus pressure, pnd, st for about 2 weeks. This week states a lot of runny nose, sinus pain and ear pressure.   No fever, no chills or sweats today. Early on may have had fever.  St now feels better but other symptoms persist.    Review of Systems  Constitutional: Negative for chills, fatigue and fever.  HENT: Positive for congestion, ear pain, postnasal drip, sinus pain and sinus pressure. Negative for nosebleeds and rhinorrhea.   Respiratory: Positive for cough. Negative for chest tightness, shortness of breath and wheezing.        Mild cough but worse last week.  Cardiovascular: Negative for chest pain and palpitations.  Gastrointestinal: Negative for abdominal pain, diarrhea, nausea and vomiting.  Musculoskeletal: Negative for back pain and myalgias.  Skin: Negative for rash.  Neurological: Negative for dizziness and light-headedness.  Hematological: Negative for adenopathy. Does not bruise/bleed easily.  Psychiatric/Behavioral: Negative for behavioral problems and confusion.    Past Medical History:  Diagnosis Date  . Arthritis of right knee 02/07/2013  . Chicken pox as a child  . Headache 06/07/2014  . Hx: UTI (urinary tract infection) 02/07/2013  . Hyperlipidemia, mixed 08/21/2014  . Insomnia 08/15/2015  . Measles as a child  . Mumps as a child  . Osteoarthritis of both knees 02/07/2013  . Preventative health care 02/07/2013  . TMJ syndrome   . UTI (urinary tract infection)      Social History   Social History  . Marital status: Married    Spouse name: N/A  . Number of children: N/A  . Years of education: N/A   Occupational History  . Not on file.   Social History Main Topics  . Smoking status: Never Smoker  . Smokeless tobacco: Never Used  . Alcohol use 0.6 oz/week    1 Standard drinks or equivalent per week   Comment: occasional wine 2-3/month  . Drug use: No  . Sexual activity: Yes    Partners: Male    Birth control/ protection: Post-menopausal     Comment: lives with husband, exercising regularly, no dietary restrictions   Other Topics Concern  . Not on file   Social History Narrative  . No narrative on file    Past Surgical History:  Procedure Laterality Date  . APPENDECTOMY  3 rd grade  . MOLE REMOVAL     benign- mole on right thigh  . scar tissue break up  2006   Dr Alfred Levins  . WISDOM TOOTH EXTRACTION  as a teenager    Family History  Problem Relation Age of Onset  . Osteoporosis Mother   . Diabetes Mother        type 2  . Cancer Mother 8       stomach  . Colon polyps Mother        benign  . Colon cancer Mother 34       Tumor in colon removed Stage 2  . Hypertension Father   . Hyperlipidemia Father   . Other Sister        herniated disk in back, low blood pressure  . Hernia Brother        X 2  . Hypertension Sister   . Depression Sister   . Cancer Maternal Grandmother        ? lung  vs breast  . Cancer Maternal Aunt     Allergies  Allergen Reactions  . Penicillins Rash    Current Outpatient Prescriptions on File Prior to Visit  Medication Sig Dispense Refill  . Calcium Carbonate-Vitamin D (CALCIUM 600+D) 600-400 MG-UNIT tablet Take 1 tablet by mouth daily.    Marland Kitchen estradiol (ESTRACE) 0.1 MG/GM vaginal cream Use 1/2 g vaginally two or three times per week as needed to maintain symptom relief. 42.5 g 2  . KRILL OIL PO Take by mouth.    Marland Kitchen LIDODERM 5 % Place 1 patch onto the skin. Pt applies as needed  2  . Magnesium 250 MG TABS Take by mouth daily.    . Melatonin 5 MG TABS Take 1 tablet by mouth at bedtime as needed.      Current Facility-Administered Medications on File Prior to Visit  Medication Dose Route Frequency Provider Last Rate Last Dose  . 0.9 %  sodium chloride infusion  500 mL Intravenous Continuous Nandigam, Kavitha V, MD      .  methylPREDNISolone acetate (DEPO-MEDROL) injection 40 mg  40 mg Intramuscular Once Brunetta Jeans, PA-C      . ondansetron (ZOFRAN-ODT) disintegrating tablet 4 mg  4 mg Oral Once Shawnee Knapp, MD        BP 107/71 (BP Location: Right Arm, Patient Position: Sitting, Cuff Size: Normal)   Pulse 71   Temp 98.3 F (36.8 C) (Oral)   Resp 16   Ht 5\' 5"  (1.651 m)   Wt 148 lb 12.8 oz (67.5 kg)   LMP 04/15/2008   SpO2 97%   BMI 24.76 kg/m       Objective:   Physical Exam  General  Mental Status - Alert. General Appearance - Well groomed. Not in acute distress.  Skin Rashes- No Rashes.  HEENT Head- Normal. Ear Auditory Canal - Left- Normal. Right - Normal.Tympanic Membrane- Left- Normal. Right- Normal. Eye Sclera/Conjunctiva- Left- Normal. Right- Normal. Nose & Sinuses Nasal Mucosa- Left-  Boggy and Congested. Right-  Boggy and  Congested.Bilateral maxillary and frontal sinus pressure. Mouth & Throat Lips: Upper Lip- Normal: no dryness, cracking, pallor, cyanosis, or vesicular eruption. Lower Lip-Normal: no dryness, cracking, pallor, cyanosis or vesicular eruption. Buccal Mucosa- Bilateral- No Aphthous ulcers. Oropharynx- No Discharge or Erythema. Tonsils: Characteristics- Bilateral- No Erythema or Congestion. Size/Enlargement- Bilateral- No enlargement. Discharge- bilateral-None.  Neck Neck- Supple. No Masses.   Chest and Lung Exam Auscultation: Breath Sounds:-Clear even and unlabored.  Cardiovascular Auscultation:Rythm- Regular, rate and rhythm. Murmurs & Other Heart Sounds:Ausculatation of the heart reveal- No Murmurs.  Lymphatic Head & Neck General Head & Neck Lymphatics: Bilateral: Description- No Localized lymphadenopathy.       Assessment & Plan:  Your appear to have sinus infection currently.. For the nasal congestion will rx flonase and will write azithromycin antibiotic.   If you get recurrent cough let me know and would call in cough med.  Symptoms  should gradually resolve if not let us know.  Follow up in 7 days or as needed

## 2016-10-11 NOTE — Patient Instructions (Addendum)
Your appear to have sinus infection currently. For the nasal congestion will rx flonase and will write azithromycin antibiotic.   If you get recurrent cough let me know and would call in cough med.  Symptoms should gradually resolve if not let us know.  Follow up in 7 days or as needed

## 2016-11-12 ENCOUNTER — Encounter: Payer: Self-pay | Admitting: Family Medicine

## 2016-11-12 ENCOUNTER — Ambulatory Visit (HOSPITAL_BASED_OUTPATIENT_CLINIC_OR_DEPARTMENT_OTHER)
Admission: RE | Admit: 2016-11-12 | Discharge: 2016-11-12 | Disposition: A | Discharge status: Home / Self Care | Payer: 59 | Source: Ambulatory Visit | Attending: Family Medicine | Admitting: Family Medicine

## 2016-11-12 ENCOUNTER — Ambulatory Visit (INDEPENDENT_AMBULATORY_CARE_PROVIDER_SITE_OTHER): Payer: 59 | Admitting: Family Medicine

## 2016-11-12 VITALS — BP 116/76 | HR 77 | Temp 98.2°F | Resp 18 | Wt 150.2 lb

## 2016-11-12 DIAGNOSIS — Z780 Asymptomatic menopausal state: Secondary | ICD-10-CM | POA: Diagnosis not present

## 2016-11-12 DIAGNOSIS — L578 Other skin changes due to chronic exposure to nonionizing radiation: Secondary | ICD-10-CM

## 2016-11-12 DIAGNOSIS — M85851 Other specified disorders of bone density and structure, right thigh: Secondary | ICD-10-CM | POA: Diagnosis not present

## 2016-11-12 DIAGNOSIS — Z0000 Encounter for general adult medical examination without abnormal findings: Secondary | ICD-10-CM

## 2016-11-12 DIAGNOSIS — M533 Sacrococcygeal disorders, not elsewhere classified: Secondary | ICD-10-CM

## 2016-11-12 DIAGNOSIS — E2839 Other primary ovarian failure: Secondary | ICD-10-CM | POA: Diagnosis not present

## 2016-11-12 DIAGNOSIS — G8929 Other chronic pain: Secondary | ICD-10-CM

## 2016-11-12 DIAGNOSIS — M8588 Other specified disorders of bone density and structure, other site: Secondary | ICD-10-CM | POA: Insufficient documentation

## 2016-11-12 DIAGNOSIS — M8580 Other specified disorders of bone density and structure, unspecified site: Secondary | ICD-10-CM | POA: Diagnosis not present

## 2016-11-12 DIAGNOSIS — Z0001 Encounter for general adult medical examination with abnormal findings: Secondary | ICD-10-CM | POA: Diagnosis not present

## 2016-11-12 DIAGNOSIS — E559 Vitamin D deficiency, unspecified: Secondary | ICD-10-CM | POA: Insufficient documentation

## 2016-11-12 DIAGNOSIS — E782 Mixed hyperlipidemia: Secondary | ICD-10-CM

## 2016-11-12 DIAGNOSIS — M545 Low back pain: Secondary | ICD-10-CM

## 2016-11-12 HISTORY — DX: Other specified disorders of bone density and structure, unspecified site: M85.80

## 2016-11-12 HISTORY — DX: Sacrococcygeal disorders, not elsewhere classified: M53.3

## 2016-11-12 LAB — CBC
HEMATOCRIT: 42.6 % (ref 36.0–46.0)
Hemoglobin: 14 g/dL (ref 12.0–15.0)
MCHC: 32.9 g/dL (ref 30.0–36.0)
MCV: 90.4 fl (ref 78.0–100.0)
Platelets: 277 10*3/uL (ref 150.0–400.0)
RBC: 4.71 Mil/uL (ref 3.87–5.11)
RDW: 13.7 % (ref 11.5–15.5)
WBC: 7 10*3/uL (ref 4.0–10.5)

## 2016-11-12 LAB — COMPREHENSIVE METABOLIC PANEL
ALK PHOS: 77 U/L (ref 39–117)
ALT: 16 U/L (ref 0–35)
AST: 16 U/L (ref 0–37)
Albumin: 4.5 g/dL (ref 3.5–5.2)
BUN: 16 mg/dL (ref 6–23)
CALCIUM: 9.4 mg/dL (ref 8.4–10.5)
CO2: 25 mEq/L (ref 19–32)
Chloride: 105 mEq/L (ref 96–112)
Creatinine, Ser: 0.69 mg/dL (ref 0.40–1.20)
GFR: 92.24 mL/min (ref >60.00)
GLUCOSE: 84 mg/dL (ref 70–99)
POTASSIUM: 3.8 meq/L (ref 3.5–5.1)
Sodium: 138 mEq/L (ref 135–145)
TOTAL PROTEIN: 7.1 g/dL (ref 6.0–8.3)
Total Bilirubin: 0.4 mg/dL (ref 0.2–1.2)

## 2016-11-12 LAB — LIPID PANEL
Cholesterol: 230 mg/dL — ABNORMAL HIGH (ref 0–200)
HDL: 84.3 mg/dL (ref >39.00)
LDL Cholesterol: 130 mg/dL — ABNORMAL HIGH (ref 0–99)
NONHDL: 145.97
TRIGLYCERIDES: 82 mg/dL (ref 0.0–149.0)
Total CHOL/HDL Ratio: 3
VLDL: 16.4 mg/dL (ref 0.0–40.0)

## 2016-11-12 LAB — TSH: TSH: 1.58 u[IU]/mL (ref 0.35–4.50)

## 2016-11-12 MED ORDER — LIDODERM 5 % EX PTCH: 1.0000 | MEDICATED_PATCH | Freq: Two times a day (BID) | CUTANEOUS | 2 refills | Status: AC | PRN

## 2016-11-12 NOTE — Patient Instructions (Addendum)
Lidoderm 4 % patches. [employer], [employer], [employer] or [employer] generic all make Lidoderm patches  [employer]iff or NOW companies for supplements. Lucky[employer].com  Recommend calcium intake of 1200 to 1500 mg daily, divided into roughly 3 doses. Best source is the diet and a single dairy serving is about 500 mg, a supplement of calcium citrate once or twice daily to balance diet is fine if not getting enough in diet. Also need Vitamin D 2000 IU caps, 1 cap daily if not already taking vitamin D. Also recommend weight baring exercise on hips and upper body to keep bones strong  Shingrix 2 shots over 6 months to prevent shingles, check with insurance to make sure they cover it   Preventive Care 40-64 Years, Female Preventive care refers to lifestyle choices and visits with your health care provider that can promote health and wellness. What does preventive care include?  A yearly physical exam. This is also called an annual well check.  Dental exams once or twice a year.  Routine eye exams. Ask your health care provider how often you should have your eyes checked.  Personal lifestyle choices, including: ? Daily care of your teeth and gums. ? Regular physical activity. ? Eating a healthy diet. ? Avoiding tobacco and drug use. ? Limiting alcohol use. ? Practicing safe sex. ? Taking low-dose aspirin daily starting at age 70. ? Taking vitamin and mineral supplements as recommended by your health care provider. What happens during an annual well check? The services and screenings done by your health care provider during your annual well check will depend on your age, overall health, lifestyle risk factors, and family history of disease. Counseling Your health care provider may ask you questions about your:  Alcohol use.  Tobacco use.  Drug use.  Emotional well-being.  Home and relationship well-being.  Sexual activity.  Eating habits.  Work and work Statistician.  Method of birth  control.  Menstrual cycle.  Pregnancy history.  Screening You may have the following tests or measurements:  Height, weight, and BMI.  Blood pressure.  Lipid and cholesterol levels. These may be checked every 5 years, or more frequently if you are over 35 years old.  Skin check.  Lung cancer screening. You may have this screening every year starting at age 59 if you have a 30-pack-year history of smoking and currently smoke or have quit within the past 15 years.  Fecal occult blood test (FOBT) of the stool. You may have this test every year starting at age 40.  Flexible sigmoidoscopy or colonoscopy. You may have a sigmoidoscopy every 5 years or a colonoscopy every 10 years starting at age 15.  Hepatitis C blood test.  Hepatitis B blood test.  Sexually transmitted disease (STD) testing.  Diabetes screening. This is done by checking your blood sugar (glucose) after you have not eaten for a while (fasting). You may have this done every 1-3 years.  Mammogram. This may be done every 1-2 years. Talk to your health care provider about when you should start having regular mammograms. This may depend on whether you have a family history of breast cancer.  BRCA-related cancer screening. This may be done if you have a family history of breast, ovarian, tubal, or peritoneal cancers.  Pelvic exam and Pap test. This may be done every 3 years starting at age 36. Starting at age 62, this may be done every 5 years if you have a Pap test in combination with an HPV test.  Bone density scan. This is done to screen for osteoporosis. You may have this scan if you are at high risk for osteoporosis.  Discuss your test results, treatment options, and if necessary, the need for more tests with your health care provider. Vaccines Your health care provider may recommend certain vaccines, such as:  Influenza vaccine. This is recommended every year.  Tetanus, diphtheria, and acellular pertussis (Tdap,  Td) vaccine. You may need a Td booster every 10 years.  Varicella vaccine. You may need this if you have not been vaccinated.  Zoster vaccine. You may need this after age 62.  Measles, mumps, and rubella (MMR) vaccine. You may need at least one dose of MMR if you were born in 1957 or later. You may also need a second dose.  Pneumococcal 13-valent conjugate (PCV13) vaccine. You may need this if you have certain conditions and were not previously vaccinated.  Pneumococcal polysaccharide (PPSV23) vaccine. You may need one or two doses if you smoke cigarettes or if you have certain conditions.  Meningococcal vaccine. You may need this if you have certain conditions.  Hepatitis A vaccine. You may need this if you have certain conditions or if you travel or work in places where you may be exposed to hepatitis A.  Hepatitis B vaccine. You may need this if you have certain conditions or if you travel or work in places where you may be exposed to hepatitis B.  Haemophilus influenzae type b (Hib) vaccine. You may need this if you have certain conditions.  Talk to your health care provider about which screenings and vaccines you need and how often you need them. This information is not intended to replace advice given to you by your health care provider. Make sure you discuss any questions you have with your health care provider. Document Released: 04/28/2015 Document Revised: 12/20/2015 Document Reviewed: 01/31/2015 Elsevier Interactive Patient Education  2017 [employer].

## 2016-11-12 NOTE — Assessment & Plan Note (Addendum)
Patient encouraged to maintain heart healthy diet, regular exercise, adequate sleep. Consider daily probiotics. Take medications as prescribed. Dexa scan ordered today. Check labs today

## 2016-11-12 NOTE — Assessment & Plan Note (Addendum)
Recurrent right hip pain off and on over the years. Had steroid shots in the past with Dr Jacelyn Grip with good resutls and has used Lidoderm patches when a small flare occurs with 2 Advil prn with good results. Referred back to PT which has helped in past

## 2016-11-12 NOTE — Assessment & Plan Note (Signed)
Dexa scan today confirms. Bone density shows osteopenia, which is thinner than normal but not as bad as osteoporosis. Recommend calcium intake of 1200 to 1500 mg daily, divided into roughly 3 doses. Best source is the diet and a single dairy serving is about 500 mg, a supplement of calcium citrate once or twice daily to balance diet is fine if not getting enough in diet. Also need Vitamin D 2000 IU caps, 1 cap daily if not already taking vitamin D. Also recommend weight baring exercise on hips and upper body to keep bones strong

## 2016-11-12 NOTE — Assessment & Plan Note (Signed)
encouraged heart healthy diet, avoid trans fats, minimize simple carbs and saturated fats. Increase exercise as tolerated 

## 2016-11-17 ENCOUNTER — Encounter: Payer: Self-pay | Admitting: Family Medicine

## 2016-11-17 DIAGNOSIS — L578 Other skin changes due to chronic exposure to nonionizing radiation: Secondary | ICD-10-CM | POA: Insufficient documentation

## 2016-11-17 DIAGNOSIS — E2839 Other primary ovarian failure: Secondary | ICD-10-CM | POA: Insufficient documentation

## 2016-11-17 DIAGNOSIS — M545 Low back pain, unspecified: Secondary | ICD-10-CM | POA: Insufficient documentation

## 2016-11-17 HISTORY — DX: Low back pain, unspecified: M54.50

## 2016-11-17 NOTE — Assessment & Plan Note (Deleted)
With some right hip pain and core weakness. After long discussion patient will try to stay active use topical treatments and is referred to physical therapy for further consideration

## 2016-11-17 NOTE — Progress Notes (Signed)
Patient ID: Ellen Morrow, female   DOB: [birth year], 61 y.o.   MRN: [id number]   Subjective:    Patient ID: Ellen Morrow, female    DOB: 09-02-1955, 61 y.o.   MRN: [id number]  Chief Complaint  Patient presents with  . Annual Exam    HPI Patient is in today for Annual preventative exam and follow-up on medical concerns. She continues to have intermittent trouble with hip pain especially on the right. Has previously undergone therapy for it with good results but the pain is recurred. Some low back pain is occasionally noticed it well but the right hip is the major symptom. No recent febrile illness or acute concerns are noted. She has been trying to maintain a heart healthy diet. She is interested in a referral to dermatology for skin changes related to aging and evaluation of sun damage. Denies CP/palp/SOB/HA/congestion/fevers/GI or GU c/o. Taking meds as prescribed  Past Medical History:  Diagnosis Date  . Arthritis of right knee 02/07/2013  . Chicken pox as a child  . Headache 06/07/2014  . Hx: UTI (urinary tract infection) 02/07/2013  . Hyperlipidemia, mixed 08/21/2014  . Insomnia 08/15/2015  . Low back pain 11/17/2016  . Measles as a child  . Mumps as a child  . Osteoarthritis of both knees 02/07/2013  . Osteopenia 11/12/2016  . Preventative health care 02/07/2013  . 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 TMJ syndrome   . UTI (urinary tract infection)     Past Surgical History:  Procedure Laterality Date  . APPENDECTOMY  3 rd grade  . MOLE REMOVAL     benign- mole on right thigh  . scar tissue break up  2006   Dr Alfred Levins  . WISDOM TOOTH EXTRACTION  as a teenager    Family History  Problem Relation Age of Onset  . Osteoporosis Mother   . Diabetes Mother        type 2  . Cancer Mother 70       stomach  . Colon polyps Mother        benign  . Colon cancer Mother 74       Tumor in colon removed Stage 2  . Hypertension Father   . Hyperlipidemia Father   . Other Sister          herniated disk in back, low blood pressure  . Hernia Brother        X 2  . Hypertension Sister   . Depression Sister   . Cancer Maternal Grandmother        ? lung vs breast  . Cancer Maternal Aunt     Social History   Social History  . Marital status: Married    Spouse name: N/A  . Number of children: N/A  . Years of education: N/A   Occupational History  . Not on file.   Social History Main Topics  . Smoking status: Never Smoker  . Smokeless tobacco: Never Used  . Alcohol use 0.6 oz/week    1 Standard drinks or equivalent per week     Comment: occasional wine 2-3/month  . Drug use: No  . Sexual activity: Yes    Partners: Male    Birth control/ protection: Post-menopausal     Comment: lives with husband, exercising regularly, no dietary restrictions   Other Topics Concern  . Not on file   Social History Narrative  . No narrative on file    Outpatient Medications Prior to Visit  Medication  Sig Dispense Refill  . Calcium Carbonate-Vitamin D (CALCIUM 600+D) 600-400 MG-UNIT tablet Take 1 tablet by mouth daily.    Marland Kitchen estradiol (ESTRACE) 0.1 MG/GM vaginal cream Use 1/2 g vaginally two or three times per week as needed to maintain symptom relief. 42.5 g 2  . KRILL OIL PO Take by mouth.    . Magnesium 250 MG TABS Take by mouth daily.    . Melatonin 5 MG TABS Take 1 tablet by mouth at bedtime as needed.     Marland Kitchen LIDODERM 5 % Place 1 patch onto the skin. Pt applies as needed  2  . azithromycin (ZITHROMAX) 250 MG tablet Take 2 tablets by mouth on day 1, followed by 1 tablet by mouth daily for 4 days. 6 tablet 0  . fluticasone (FLONASE) 50 MCG/ACT nasal spray Place 2 sprays into both nostrils daily. 16 g 0  . 0.9 %  sodium chloride infusion     . methylPREDNISolone acetate (DEPO-MEDROL) injection 40 mg     . ondansetron (ZOFRAN-ODT) disintegrating tablet 4 mg      No facility-administered medications prior to visit.     Allergies  Allergen Reactions  . Penicillins  Rash    Review of Systems  Constitutional: Negative for chills, fever and malaise/fatigue.  HENT: Negative for congestion and hearing loss.   Eyes: Negative for discharge.  Respiratory: Negative for cough, sputum production and shortness of breath.   Cardiovascular: Negative for chest pain, palpitations and leg swelling.  Gastrointestinal: Negative for abdominal pain, blood in stool, constipation, diarrhea, heartburn, nausea and vomiting.  Genitourinary: Negative for dysuria, frequency, hematuria and urgency.  Musculoskeletal: Positive for back pain and joint pain. Negative for falls and myalgias.  Skin: Negative for rash.  Neurological: Negative for dizziness, sensory change, loss of consciousness, weakness and headaches.  Endo/Heme/Allergies: Negative for environmental allergies. Does not bruise/bleed easily.  Psychiatric/Behavioral: Negative for depression and suicidal ideas. The patient is not nervous/anxious and does not have insomnia.        Objective:    Physical Exam  Constitutional: She is oriented to person, place, and time. She appears well-developed and well-nourished. No distress.  HENT:  Head: Normocephalic and atraumatic.  Eyes: Conjunctivae are normal.  Neck: Neck supple. No thyromegaly present.  Cardiovascular: Normal rate, regular rhythm and normal heart sounds.   No murmur heard. Pulmonary/Chest: Effort normal and breath sounds normal. No respiratory distress.  Abdominal: Soft. Bowel sounds are normal. She exhibits no distension and no mass. There is no tenderness.  Musculoskeletal: She exhibits no edema.  Lymphadenopathy:    She has no cervical adenopathy.  Neurological: She is alert and oriented to person, place, and time.  Skin: Skin is warm and dry.  Psychiatric: She has a normal mood and affect. Her behavior is normal.    BP 116/76 (BP Location: Left Arm, Patient Position: Sitting, Cuff Size: Normal)   Pulse 77   Temp 98.2 F (36.8 C) (Oral)   Resp  18   Wt 150 lb 3.2 oz (68.1 kg)   LMP 04/15/2008   SpO2 96%   BMI 24.99 kg/m  Wt Readings from Last 3 Encounters:  11/12/16 150 lb 3.2 oz (68.1 kg)  10/11/16 148 lb 12.8 oz (67.5 kg)  07/24/16 143 lb (64.9 kg)     Lab Results  Component Value Date   WBC 7.0 11/12/2016   HGB 14.0 11/12/2016   HCT 42.6 11/12/2016   PLT 277.0 11/12/2016   GLUCOSE 84 11/12/2016   CHOL  230 (H) 11/12/2016   TRIG 82.0 11/12/2016   HDL 84.30 11/12/2016   LDLCALC 130 (H) 11/12/2016   ALT 16 11/12/2016   AST 16 11/12/2016   NA 138 11/12/2016   K 3.8 11/12/2016   CL 105 11/12/2016   CREATININE 0.69 11/12/2016   BUN 16 11/12/2016   CO2 25 11/12/2016   TSH 1.58 11/12/2016    Lab Results  Component Value Date   TSH 1.58 11/12/2016   Lab Results  Component Value Date   WBC 7.0 11/12/2016   HGB 14.0 11/12/2016   HCT 42.6 11/12/2016   MCV 90.4 11/12/2016   PLT 277.0 11/12/2016   Lab Results  Component Value Date   NA 138 11/12/2016   K 3.8 11/12/2016   CO2 25 11/12/2016   GLUCOSE 84 11/12/2016   BUN 16 11/12/2016   CREATININE 0.69 11/12/2016   BILITOT 0.4 11/12/2016   ALKPHOS 77 11/12/2016   AST 16 11/12/2016   ALT 16 11/12/2016   PROT 7.1 11/12/2016   ALBUMIN 4.5 11/12/2016   CALCIUM 9.4 11/12/2016   GFR 92.24 11/12/2016   Lab Results  Component Value Date   CHOL 230 (H) 11/12/2016   Lab Results  Component Value Date   HDL 84.30 11/12/2016   Lab Results  Component Value Date   LDLCALC 130 (H) 11/12/2016   Lab Results  Component Value Date   TRIG 82.0 11/12/2016   Lab Results  Component Value Date   CHOLHDL 3 11/12/2016   No results found for: HGBA1C     Assessment & Plan:   Problem List Items Addressed This Visit    Preventative health care    Patient encouraged to maintain heart healthy diet, regular exercise, adequate sleep. Consider daily probiotics. Take medications as prescribed. Dexa scan ordered today. Check labs today      Relevant Orders   CBC  (Completed)   Comprehensive metabolic panel (Completed)   TSH (Completed)   Hyperlipidemia, mixed - Primary    encouraged heart healthy diet, avoid trans fats, minimize simple carbs and saturated fats. Increase exercise as tolerated      Relevant Orders   Lipid panel (Completed)   Sacroiliac dysfunction    Recurrent right hip pain off and on over the years. Had steroid shots in the past with Dr Jacelyn Grip with good resutls and has used Lidoderm patches when a small flare occurs with 2 Advil prn with good results. Referred back to PT which has helped in past      Relevant Orders   Ambulatory referral to Physical Therapy   Osteopenia    Dexa scan today confirms. Bone density shows osteopenia, which is thinner than normal but not as bad as osteoporosis. Recommend calcium intake of 1200 to 1500 mg daily, divided into roughly 3 doses. Best source is the diet and a single dairy serving is about 500 mg, a supplement of calcium citrate once or twice daily to balance diet is fine if not getting enough in diet. Also need Vitamin D 2000 IU caps, 1 cap daily if not already taking vitamin D. Also recommend weight baring exercise on hips and upper body to keep bones strong      Estrogen deficiency    Check bone density       Relevant Orders   DG Bone Density (Completed)   Sun-damaged skin    Referred to dermatology for further evaluation and encouraged good sun screin use.       Relevant Orders  Ambulatory referral to Dermatology   Low back pain    Other Visit Diagnoses    Post-menopausal       Relevant Orders   DG Bone Density (Completed)      I have discontinued Ms. Mcdill's fluticasone and azithromycin. I have also changed her LIDODERM. Additionally, I am having her maintain her Magnesium, KRILL OIL PO, Calcium Carbonate-Vitamin D, Melatonin, and estradiol. We will stop administering methylPREDNISolone acetate, ondansetron, and sodium chloride.  Meds ordered this encounter  Medications  .  LIDODERM 5 %    Sig: Place 1 patch onto the skin every 12 (twelve) hours as needed. Pt applies as needed    Dispense:  30 patch    Refill:  2    CMA served as scribe during this visit. History, Physical and Plan performed by medical provider. Documentation and orders reviewed and attested to.  Penni Homans, MD

## 2016-11-17 NOTE — Assessment & Plan Note (Signed)
Check bone density. 

## 2016-11-17 NOTE — Assessment & Plan Note (Signed)
Referred to dermatology for further evaluation and encouraged good sun screin use.

## 2016-11-27 ENCOUNTER — Other Ambulatory Visit: Payer: Self-pay | Admitting: Medical

## 2016-11-27 ENCOUNTER — Telehealth: Payer: Self-pay | Admitting: Medical

## 2016-11-27 ENCOUNTER — Ambulatory Visit (INDEPENDENT_AMBULATORY_CARE_PROVIDER_SITE_OTHER): Payer: 59 | Admitting: Medical

## 2016-11-27 ENCOUNTER — Encounter: Payer: Self-pay | Admitting: Medical

## 2016-11-27 ENCOUNTER — Ambulatory Visit (HOSPITAL_BASED_OUTPATIENT_CLINIC_OR_DEPARTMENT_OTHER)
Admission: RE | Admit: 2016-11-27 | Discharge: 2016-11-27 | Disposition: A | Discharge status: Home / Self Care | Payer: 59 | Source: Ambulatory Visit | Attending: Medical | Admitting: Medical

## 2016-11-27 VITALS — BP 114/57 | HR 66 | Temp 98.1°F | Resp 16 | Ht 65.0 in | Wt 150.0 lb

## 2016-11-27 DIAGNOSIS — M25551 Pain in right hip: Secondary | ICD-10-CM | POA: Diagnosis not present

## 2016-11-27 DIAGNOSIS — M79651 Pain in right thigh: Secondary | ICD-10-CM

## 2016-11-27 MED ORDER — PRENATE ENHANCE 28-0.6-0.4-400 MG PO CAPS: ORAL_CAPSULE | ORAL | 11 refills | Status: DC

## 2016-11-27 MED ORDER — DICLOFENAC SODIUM 75 MG PO TBEC: 75.0000 mg | DELAYED_RELEASE_TABLET | Freq: Two times a day (BID) | ORAL | 0 refills | Status: DC

## 2016-11-27 NOTE — Progress Notes (Signed)
Subjective:    Patient ID: Ellen Morrow, female    DOB: 1955/11/19, 61 y.o.   MRN: [id number]  HPI  Pt in for some hip area pain.  No fall or injury. She has been exercising just recently. She has been jogging some but no pain.  She does note twisting/moving a suitcase felt some mild pain yesterday. Then today she saw bruise in area of pain.   Pt states walking feels worse. Today in shower shaving legs and standing on rt leg only increased pain.   Review of Systems  Constitutional: Negative for chills, fatigue and fever.  Respiratory: Negative for cough, chest tightness, shortness of breath and stridor.   Cardiovascular: Negative for chest pain and palpitations.  Gastrointestinal: Negative for abdominal pain and anal bleeding.  Musculoskeletal:       Rt hip/thigh area pain  Skin: Negative for pallor and rash.  Neurological: Negative for dizziness and headaches.  Hematological: Negative for adenopathy. Does not bruise/bleed easily.  Psychiatric/Behavioral: Negative for behavioral problems and confusion.     Past Medical History:  Diagnosis Date  . Arthritis of right knee 02/07/2013  . Chicken pox as a child  . Headache 06/07/2014  . Hx: UTI (urinary tract infection) 02/07/2013  . Hyperlipidemia, mixed 08/21/2014  . Insomnia 08/15/2015  . Low back pain 11/17/2016  . Measles as a child  . Mumps as a child  . Osteoarthritis of both knees 02/07/2013  . Osteopenia 11/12/2016  . Preventative health care 02/07/2013  . 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 TMJ syndrome   . UTI (urinary tract infection)      Social History   Social History  . Marital status: Married    Spouse name: N/A  . Number of children: N/A  . Years of education: N/A   Occupational History  . Not on file.   Social History Main Topics  . Smoking status: Never Smoker  . Smokeless tobacco: Never Used  . Alcohol use 0.6 oz/week    1 Standard drinks or equivalent per week     Comment: occasional wine  2-3/month  . Drug use: No  . Sexual activity: Yes    Partners: Male    Birth control/ protection: Post-menopausal     Comment: lives with husband, exercising regularly, no dietary restrictions   Other Topics Concern  . Not on file   Social History Narrative  . No narrative on file    Past Surgical History:  Procedure Laterality Date  . APPENDECTOMY  3 rd grade  . MOLE REMOVAL     benign- mole on right thigh  . scar tissue break up  2006   Dr Alfred Levins  . WISDOM TOOTH EXTRACTION  as a teenager    Family History  Problem Relation Age of Onset  . Osteoporosis Mother   . Diabetes Mother        type 2  . Cancer Mother 26       stomach  . Colon polyps Mother        benign  . Colon cancer Mother 19       Tumor in colon removed Stage 2  . Hypertension Father   . Hyperlipidemia Father   . Other Sister        herniated disk in back, low blood pressure  . Hernia Brother        X 2  . Hypertension Sister   . Depression Sister   . Cancer Maternal Grandmother        ?  lung vs breast  . Cancer Maternal Aunt     Allergies  Allergen Reactions  . Penicillins Rash    Current Outpatient Prescriptions on File Prior to Visit  Medication Sig Dispense Refill  . Calcium Carbonate-Vitamin D (CALCIUM 600+D) 600-400 MG-UNIT tablet Take 1 tablet by mouth daily.    Marland Kitchen estradiol (ESTRACE) 0.1 MG/GM vaginal cream Use 1/2 g vaginally two or three times per week as needed to maintain symptom relief. 42.5 g 2  . KRILL OIL PO Take by mouth.    Marland Kitchen LIDODERM 5 % Place 1 patch onto the skin every 12 (twelve) hours as needed. Pt applies as needed 30 patch 2  . Magnesium 250 MG TABS Take by mouth daily.    . Melatonin 5 MG TABS Take 1 tablet by mouth at bedtime as needed.      No current facility-administered medications on file prior to visit.     BP (!) 114/57   Pulse 66   Temp 98.1 F (36.7 C) (Oral)   Resp 16   Ht 5\' 5"  (1.651 m)   Wt 150 lb (68 kg)   LMP 04/15/2008   SpO2 100%    BMI 24.96 kg/m       Objective:   Physical Exam   General- No acute distress. Pleasant patient. Neck- Full range of motion, no jvd Lungs- Clear, even and unlabored. Heart- regular rate and rhythm. Neurologic- CNII- XII grossly intact.  Rt hip- good rom. Bruising in 3 area. One bruise moderate. No vesicles seen.  Back- no  Lumbar spine pain on palpation.     Assessment & Plan:  For hip and thigh area pain will rx dicofenac.  Use ice(cold compress) twice daily for next 2-3 days. But after that warm compresses.  Attend PT tomorrow.   Will get xray today of hip area.  Follow up in 7-10 days or as needed

## 2016-11-27 NOTE — Patient Instructions (Addendum)
For hip and thigh area pain will rx dicofenac.  Use ice(cold compress) twice daily for next 2-3 days. But after that warm compresses.  Attend PT tomorrow.   Will get xray today of hip area  Follow up in 7-10 days or as needed

## 2016-11-27 NOTE — Telephone Encounter (Signed)
Opened to review 

## 2016-11-27 NOTE — Telephone Encounter (Signed)
Filled pt prenatal vitamin at her request.

## 2016-11-29 NOTE — Telephone Encounter (Addendum)
Medication filled to pharmacy in error. Medication canceled at pharmacy by patient and removed from medication list. Patient called in to make [place] aware. No further needs at this time.

## 2016-11-29 NOTE — Addendum Note (Signed)
Addended by: Dorrene German on: 11/29/2016 12:05 PM   Modules accepted: Orders

## 2016-12-12 ENCOUNTER — Encounter: Payer: Self-pay | Admitting: Medical

## 2016-12-12 ENCOUNTER — Ambulatory Visit: Payer: 59 | Admitting: Medical

## 2016-12-12 ENCOUNTER — Ambulatory Visit (INDEPENDENT_AMBULATORY_CARE_PROVIDER_SITE_OTHER): Payer: 59 | Admitting: Medical

## 2016-12-12 VITALS — BP 115/82 | HR 62 | Temp 98.4°F | Resp 16 | Ht 65.0 in | Wt 152.8 lb

## 2016-12-12 DIAGNOSIS — M8580 Other specified disorders of bone density and structure, unspecified site: Secondary | ICD-10-CM | POA: Diagnosis not present

## 2016-12-12 DIAGNOSIS — M545 Low back pain, unspecified: Secondary | ICD-10-CM

## 2016-12-12 MED ORDER — MELOXICAM 7.5 MG PO TABS: ORAL_TABLET | ORAL | 0 refills | Status: DC

## 2016-12-12 MED ORDER — CYCLOBENZAPRINE HCL 5 MG PO TABS: ORAL_TABLET | ORAL | 0 refills | Status: DC

## 2016-12-12 MED ORDER — HYDROCODONE-ACETAMINOPHEN 5-325 MG PO TABS: 1.0000 | ORAL_TABLET | Freq: Four times a day (QID) | ORAL | 0 refills | Status: DC | PRN

## 2016-12-12 MED ORDER — KETOROLAC TROMETHAMINE 60 MG/2ML IM SOLN
60.0000 mg | Freq: Once | INTRAMUSCULAR | Status: AC
  Administered 2016-12-12: 60 mg via INTRAMUSCULAR

## 2016-12-12 NOTE — Progress Notes (Signed)
Subjective:    Patient ID: Ellen Morrow, female    DOB: 1955-04-19, 61 y.o.   MRN: [id number]  HPI   Pt in states just recently when she was doing some crunches on floor felt sharp pain in he lower back. Pt had been going to PT for rt hip/rt thigh pain.   Pt placed ice to area since Tuesday.  Pt is heading out of next week.   No pain shooting to legs. No leg weakness. No saddle anesthesia.  Pain moderate to severe with movements.  Review of Systems  Constitutional: Negative for chills, fatigue and fever.  HENT: Negative for congestion, ear pain, postnasal drip and rhinorrhea.   Respiratory: Negative for cough, chest tightness, shortness of breath and wheezing.   Cardiovascular: Negative for chest pain and palpitations.  Gastrointestinal: Negative for abdominal pain, constipation, nausea and vomiting.  Genitourinary: Negative for difficulty urinating, dysuria, flank pain, frequency, urgency and vaginal pain.  Musculoskeletal: Positive for back pain. Negative for neck pain and neck stiffness.  Skin: Negative for rash.  Neurological: Negative for dizziness, seizures, speech difficulty, weakness and light-headedness.  Hematological: Negative for adenopathy. Does not bruise/bleed easily.  Psychiatric/Behavioral: Negative for behavioral problems and confusion.    Past Medical History:  Diagnosis Date  . Arthritis of right knee 02/07/2013  . Chicken pox as a child  . Headache 06/07/2014  . Hx: UTI (urinary tract infection) 02/07/2013  . Hyperlipidemia, mixed 08/21/2014  . Insomnia 08/15/2015  . Low back pain 11/17/2016  . Measles as a child  . Mumps as a child  . Osteoarthritis of both knees 02/07/2013  . Osteopenia 11/12/2016  . Preventative health care 02/07/2013  . 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 TMJ syndrome   . UTI (urinary tract infection)      Social History   Social History  . Marital status: Married    Spouse name: N/A  . Number of children: N/A  . Years of  education: N/A   Occupational History  . Not on file.   Social History Main Topics  . Smoking status: Never Smoker  . Smokeless tobacco: Never Used  . Alcohol use 0.6 oz/week    1 Standard drinks or equivalent per week     Comment: occasional wine 2-3/month  . Drug use: No  . Sexual activity: Yes    Partners: Male    Birth control/ protection: Post-menopausal     Comment: lives with husband, exercising regularly, no dietary restrictions   Other Topics Concern  . Not on file   Social History Narrative  . No narrative on file    Past Surgical History:  Procedure Laterality Date  . APPENDECTOMY  3 rd grade  . MOLE REMOVAL     benign- mole on right thigh  . scar tissue break up  2006   Dr Alfred Levins  . WISDOM TOOTH EXTRACTION  as a teenager    Family History  Problem Relation Age of Onset  . Osteoporosis Mother   . Diabetes Mother        type 2  . Cancer Mother 41       stomach  . Colon polyps Mother        benign  . Colon cancer Mother 59       Tumor in colon removed Stage 2  . Hypertension Father   . Hyperlipidemia Father   . Other Sister        herniated disk in back, low blood pressure  .  Hernia Brother        X 2  . Hypertension Sister   . Depression Sister   . Cancer Maternal Grandmother        ? lung vs breast  . Cancer Maternal Aunt     Allergies  Allergen Reactions  . Penicillins Rash    Current Outpatient Prescriptions on File Prior to Visit  Medication Sig Dispense Refill  . Calcium Carbonate-Vitamin D (CALCIUM 600+D) 600-400 MG-UNIT tablet Take 1 tablet by mouth daily.    Marland Kitchen estradiol (ESTRACE) 0.1 MG/GM vaginal cream Use 1/2 g vaginally two or three times per week as needed to maintain symptom relief. 42.5 g 2  . KRILL OIL PO Take by mouth.    Marland Kitchen LIDODERM 5 % Place 1 patch onto the skin every 12 (twelve) hours as needed. Pt applies as needed 30 patch 2  . Magnesium 250 MG TABS Take by mouth daily.    . Melatonin 5 MG TABS Take 1 tablet  by mouth at bedtime as needed.      No current facility-administered medications on file prior to visit.     BP 115/82   Pulse 62   Temp 98.4 F (36.9 C) (Oral)   Resp 16   Ht 5\' 5"  (1.651 m)   Wt 152 lb 12.8 oz (69.3 kg)   LMP 04/15/2008   SpO2 97%   BMI 25.43 kg/m       Objective:   Physical Exam  General Appearance- Not in acute distress.    Chest and Lung Exam Auscultation: Breath sounds:-Normal. Clear even and unlabored. Adventitious sounds:- No Adventitious sounds.  Cardiovascular Auscultation:Rythm - Regular, rate and rythm. Heart Sounds -Normal heart sounds.  Abdomen Inspection:-Inspection Normal.  Palpation/Perucssion: Palpation and Percussion of the abdomen reveal- Non Tender, No Rebound tenderness, No rigidity(Guarding) and No Palpable abdominal masses.  Liver:-Normal.  Spleen:- Normal.   Back No Mid lumbar spine tenderness to palpation. No Pain on straight leg lift. Pain on lateral movements and flexion/extension of the spine. But rt side lower tspine. Not midline but adjacent and to rt of spine.  Lower ext neurologic  L5-S1 sensation intact bilaterally. Normal patellar reflexes bilaterally. No foot drop bilaterally.      Assessment & Plan:  For your back pain we gave you Toradol 60 mg intramuscular injection.  Tomorrow evening can start meloxicam NSAID tablet. Prescription of Flexeril muscle relaxant to use at night.  I'm going to prescribe Norco pain medication in the event your back pain were to flare while you're out of town in [place]. Would recommend going ahead and filling the prescriptions in case he needed.  Today decided not to get x-ray of your thoracic spine since the pain is not midline but rather in the muscle adjacent to the spine.  10-14 days or as needed.   Raiyah Speakman, Percell Miller, PA-C

## 2016-12-12 NOTE — Patient Instructions (Signed)
For your back pain we gave you Toradol 60 mg intramuscular injection.  Tomorrow evening can start meloxicam NSAID tablet. Prescription of Flexeril muscle relaxant to use at night.  I'm going to prescribe Norco pain medication in the event your back pain were to flare while you're out of town in [place]. Would recommend going ahead and filling the prescriptions in case he needed.  Today decided not to get x-ray of your thoracic spine since the pain is not midline but rather in the muscle adjacent to the spine.  10-14 days or as needed.

## 2016-12-30 ENCOUNTER — Telehealth: Payer: Self-pay | Admitting: *Deleted

## 2016-12-30 NOTE — Telephone Encounter (Signed)
Received Physician Orders from [facility] PT; forwarded to provider/SLS 09/17

## 2017-05-30 ENCOUNTER — Ambulatory Visit: Payer: 59 | Admitting: Family

## 2017-05-30 ENCOUNTER — Encounter: Payer: Self-pay | Admitting: Family

## 2017-05-30 VITALS — BP 114/62 | HR 62 | Temp 98.2°F | Resp 16 | Ht 65.0 in | Wt 150.2 lb

## 2017-05-30 DIAGNOSIS — Z20828 Contact with and (suspected) exposure to other viral communicable diseases: Secondary | ICD-10-CM

## 2017-05-30 DIAGNOSIS — J069 Acute upper respiratory infection, unspecified: Secondary | ICD-10-CM | POA: Diagnosis not present

## 2017-05-30 DIAGNOSIS — R52 Pain, unspecified: Secondary | ICD-10-CM | POA: Diagnosis not present

## 2017-05-30 LAB — POCT INFLUENZA A/B
INFLUENZA A, POC: NEGATIVE
INFLUENZA B, POC: NEGATIVE

## 2017-05-30 NOTE — Patient Instructions (Signed)
You may continue tylenol or motrin as needed for pain/fever/aches. Call if you develop worsening symptoms or if not improved in 3 days.

## 2017-05-30 NOTE — Progress Notes (Signed)
Subjective:    Patient ID: Ellen Morrow, female    DOB: October 22, 1955, 62 y.o.   MRN: [id number]  HPI  Friday to Tuesday 2/12 was with daughter who had the flu. Reports that she did have a flu shot. Daughter did not have a flu shot.   Has post nasal drip, ear popping, neck sore because "glands hurt."  These symptoms started about 6 days ago.  Using tylenol/ibuprofen, mucinex prn.  Denies myalgia.    Review of Systems See HPI  Past Medical History:  Diagnosis Date  . Arthritis of right knee 02/07/2013  . Chicken pox as a child  . Headache 06/07/2014  . Hx: UTI (urinary tract infection) 02/07/2013  . Hyperlipidemia, mixed 08/21/2014  . Insomnia 08/15/2015  . Low back pain 11/17/2016  . Measles as a child  . Mumps as a child  . Osteoarthritis of both knees 02/07/2013  . Osteopenia 11/12/2016  . Preventative health care 02/07/2013  . 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 TMJ syndrome   . UTI (urinary tract infection)      Social History   Socioeconomic History  . Marital status: Married    Spouse name: Not on file  . Number of children: Not on file  . Years of education: Not on file  . Highest education level: Not on file  Social Needs  . Financial resource strain: Not on file  . Food insecurity - worry: Not on file  . Food insecurity - inability: Not on file  . Transportation needs - medical: Not on file  . Transportation needs - non-medical: Not on file  Occupational History  . Not on file  Tobacco Use  . Smoking status: Never Smoker  . Smokeless tobacco: Never Used  Substance and Sexual Activity  . Alcohol use: Yes    Alcohol/week: 0.6 oz    Types: 1 Standard drinks or equivalent per week    Comment: occasional wine 2-3/month  . Drug use: No  . Sexual activity: Yes    Partners: Male    Birth control/protection: Post-menopausal    Comment: lives with husband, exercising regularly, no dietary restrictions  Other Topics Concern  . Not on file  Social History Narrative    . Not on file    Past Surgical History:  Procedure Laterality Date  . APPENDECTOMY  3 rd grade  . MOLE REMOVAL     benign- mole on right thigh  . scar tissue break up  2006   Dr Alfred Levins  . WISDOM TOOTH EXTRACTION  as a teenager    Family History  Problem Relation Age of Onset  . Osteoporosis Mother   . Diabetes Mother        type 2  . Cancer Mother 29       stomach  . Colon polyps Mother        benign  . Colon cancer Mother 51       Tumor in colon removed Stage 2  . Hypertension Father   . Hyperlipidemia Father   . Other Sister        herniated disk in back, low blood pressure  . Hernia Brother        X 2  . Hypertension Sister   . Depression Sister   . Cancer Maternal Grandmother        ? lung vs breast  . Cancer Maternal Aunt     Allergies  Allergen Reactions  . Penicillins Rash    Current Outpatient Medications  on File Prior to Visit  Medication Sig Dispense Refill  . Calcium Carbonate-Vitamin D (CALCIUM 600+D) 600-400 MG-UNIT tablet Take 1 tablet by mouth daily.    . cyclobenzaprine (FLEXERIL) 5 MG tablet 1 tablet by mouth daily at bedtime as needed muscle spasms 10 tablet 0  . estradiol (ESTRACE) 0.1 MG/GM vaginal cream Use 1/2 g vaginally two or three times per week as needed to maintain symptom relief. 42.5 g 2  . HYDROcodone-acetaminophen (NORCO/VICODIN) 5-325 MG tablet Take 1 tablet by mouth every 6 (six) hours as needed for moderate pain. 12 tablet 0  . KRILL OIL PO Take by mouth.    Marland Kitchen LIDODERM 5 % Place 1 patch onto the skin every 12 (twelve) hours as needed. Pt applies as needed 30 patch 2  . Magnesium 250 MG TABS Take by mouth daily.    . Melatonin 5 MG TABS Take 1 tablet by mouth at bedtime as needed.     . meloxicam (MOBIC) 7.5 MG tablet 1-2 tablets by mouth daily 30 tablet 0   No current facility-administered medications on file prior to visit.     BP 114/62 (BP Location: Right Arm, Patient Position: Sitting, Cuff Size: Normal)   Pulse  62   Temp 98.2 F (36.8 C) (Oral)   Resp 16   Ht 5\' 5"  (1.651 m)   Wt 150 lb 3.2 oz (68.1 kg)   LMP 04/15/2008   SpO2 99%   BMI 24.99 kg/m       Objective:   Physical Exam  Constitutional: She is oriented to person, place, and time. She appears well-developed and well-nourished.  HENT:  Head: Normocephalic and atraumatic.  Right Ear: Tympanic membrane and ear canal normal.  Left Ear: Tympanic membrane and ear canal normal.  Mouth/Throat: No oropharyngeal exudate, posterior oropharyngeal edema or posterior oropharyngeal erythema.  Neck: Neck supple.  Cardiovascular: Normal rate, regular rhythm and normal heart sounds.  No murmur heard. Pulmonary/Chest: Effort normal and breath sounds normal. No respiratory distress. She has no wheezes.  Lymphadenopathy:    She has no cervical adenopathy.  Neurological: She is alert and oriented to person, place, and time.  Psychiatric: She has a normal mood and affect. Her behavior is normal. Judgment and thought content normal.          Assessment & Plan:  Viral URI- rapid flu negative. Advised pt to continue supportive measures, call if symptoms worsen or if not improved in 3 days.

## 2017-06-11 ENCOUNTER — Ambulatory Visit (INDEPENDENT_AMBULATORY_CARE_PROVIDER_SITE_OTHER): Payer: 59 | Admitting: Obstetrics and Gynecology

## 2017-06-11 ENCOUNTER — Encounter: Payer: Self-pay | Admitting: Obstetrics and Gynecology

## 2017-06-11 ENCOUNTER — Other Ambulatory Visit: Payer: Self-pay

## 2017-06-11 VITALS — BP 110/72 | HR 66 | Resp 16 | Ht 66.0 in | Wt 153.0 lb

## 2017-06-11 DIAGNOSIS — Z809 Family history of malignant neoplasm, unspecified: Secondary | ICD-10-CM | POA: Diagnosis not present

## 2017-06-11 DIAGNOSIS — N3946 Mixed incontinence: Secondary | ICD-10-CM

## 2017-06-11 DIAGNOSIS — R102 Pelvic and perineal pain: Secondary | ICD-10-CM | POA: Diagnosis not present

## 2017-06-11 DIAGNOSIS — N952 Postmenopausal atrophic vaginitis: Secondary | ICD-10-CM

## 2017-06-11 DIAGNOSIS — Z01419 Encounter for gynecological examination (general) (routine) without abnormal findings: Secondary | ICD-10-CM

## 2017-06-11 LAB — POCT URINALYSIS DIPSTICK
Bilirubin, UA: NEGATIVE
Blood, UA: NEGATIVE
GLUCOSE UA: NEGATIVE
Ketones, UA: NEGATIVE
LEUKOCYTES UA: NEGATIVE
NITRITE UA: NEGATIVE
PROTEIN UA: NEGATIVE
Urobilinogen, UA: 0.2 E.U./dL
pH, UA: 6 (ref 5.0–8.0)

## 2017-06-11 MED ORDER — ESTRADIOL 0.1 MG/GM VA CREA: TOPICAL_CREAM | VAGINAL | 2 refills | Status: DC

## 2017-06-11 NOTE — Progress Notes (Signed)
62 y.o. G2P2 Married Caucasian female here for annual exam.    ROS - frequent urination and loss of urine spontaneously and with cough/sneeze. Wears a pad every day. Needs it 9 times out of 10.   Feeling a dull internal discomfort after emptying her bladder since the Holidays 2018.  Feels like there is something there. No actually burning with urination.  No vaginal bleeding.  Normal BMs.  No fecal incontinence.   Did physical therapy in the fall for pulled muscle in her right hip. Hx SI joint and low back pain. Drives a lot.   Using vaginal estrogen cream 2 -3 times a week.  Labs with PCP in July 2018.  States her cholesterol is elevated.   Urine dip today - negative.   PCP:   Dr. Charlett Blake  Patient's last menstrual period was 04/15/2008.           Sexually active: Yes.    The current method of family planning is post menopausal status.    Exercising: Yes.    personal trainer and running Smoker:  no  Health Maintenance: Pap:  05-29-15 Neg:Neg HR HPV History of abnormal Pap:  no MMG:  06/21/16 BIRADS 1 negative/density b.  Has appointment.  Colonoscopy:  07/24/16 benign polyp removed; f/u 5 yrs BMD:   11/12/16  Result  11/12/16 Osteopenia.  PCP ordering.  TDaP:  06/2013 Gardasil:   n/a HIV: Donated blood in the past Hep C: 06/07/16 Negative Screening Labs:  PCP   reports that  has never smoked. she has never used smokeless tobacco. She reports that she drinks about 0.6 oz of alcohol per week. She reports that she does not use drugs.  Past Medical History:  Diagnosis Date  . Arthritis of right knee 02/07/2013  . Chicken pox as a child  . Headache 06/07/2014  . Hx: UTI (urinary tract infection) 02/07/2013  . Hyperlipidemia, mixed 08/21/2014  . Insomnia 08/15/2015  . Low back pain 11/17/2016  . Measles as a child  . Mumps as a child  . Osteoarthritis of both knees 02/07/2013  . Osteopenia 11/12/2016  . Preventative health care 02/07/2013  . 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  TMJ syndrome   . UTI (urinary tract infection)     Past Surgical History:  Procedure Laterality Date  . APPENDECTOMY  3 rd grade  . MOLE REMOVAL     benign- mole on right thigh  . scar tissue break up  2006   Dr Alfred Levins  . WISDOM TOOTH EXTRACTION  as a teenager    Current Outpatient Medications  Medication Sig Dispense Refill  . Ascorbic Acid (VITAMIN C) 1000 MG tablet Take 1,000 mg by mouth 2 (two) times daily.    . Calcium Carbonate-Vitamin D (CALCIUM 600+D) 600-400 MG-UNIT tablet Take 1 tablet by mouth daily.    . cyclobenzaprine (FLEXERIL) 5 MG tablet 1 tablet by mouth daily at bedtime as needed muscle spasms 10 tablet 0  . estradiol (ESTRACE) 0.1 MG/GM vaginal cream Use 1/2 g vaginally two or three times per week as needed to maintain symptom relief. 42.5 g 2  . KRILL OIL PO Take by mouth.    Marland Kitchen LIDODERM 5 % Place 1 patch onto the skin every 12 (twelve) hours as needed. Pt applies as needed 30 patch 2  . Magnesium 250 MG TABS Take by mouth daily.    . Melatonin 5 MG TABS Take 1 tablet by mouth at bedtime as needed.      No  current facility-administered medications for this visit.     Family History  Problem Relation Age of Onset  . Osteoporosis Mother   . Diabetes Mother        type 2  . Cancer Mother 6       stomach  . Colon polyps Mother        benign  . Colon cancer Mother 42       Tumor in colon removed Stage 2  . Hypertension Father   . Hyperlipidemia Father   . Other Sister        herniated disk in back, low blood pressure  . Hernia Brother        X 2  . Hypertension Sister   . Depression Sister   . Cancer Maternal Grandmother        ? lung vs breast  . Cancer Maternal Aunt     ROS:  Pertinent items are noted in HPI.  Otherwise, a comprehensive ROS was negative.  Exam:   BP 110/72 (BP Location: Right Arm, Patient Position: Sitting, Cuff Size: Normal)   Pulse 66   Resp 16   Ht 5\' 6"  (1.676 m)   Wt 153 lb (69.4 kg)   LMP 04/15/2008   BMI  24.69 kg/m     General appearance: alert, cooperative and appears stated age Head: Normocephalic, without obvious abnormality, atraumatic Neck: no adenopathy, supple, symmetrical, trachea midline and thyroid normal to inspection and palpation Lungs: clear to auscultation bilaterally Breasts: normal appearance, no masses or tenderness, No nipple retraction or dimpling, No nipple discharge or bleeding, No axillary or supraclavicular adenopathy Heart: regular rate and rhythm Abdomen: soft, non-tender; no masses, no organomegaly Extremities: extremities normal, atraumatic, no cyanosis or edema Skin: Skin color, texture, turgor normal. No rashes or lesions Lymph nodes: Cervical, supraclavicular, and axillary nodes normal. No abnormal inguinal nodes palpated Neurologic: Grossly normal  Pelvic: External genitalia:  no lesions              Urethra:  normal appearing urethra with no masses, tenderness or lesions              Bartholins and Skenes: normal                 Vagina: normal appearing vagina with normal color and discharge, no lesions.  Good support.               Cervix: no lesions              Pap taken: No. Bimanual Exam:  Uterus:  normal size, contour, position, consistency, mobility, non-tender              Adnexa: no mass, fullness, tenderness              Rectal exam: Yes.  .  Confirms.              Anus:  normal sphincter tone, no lesions  Chaperone was present for exam.  Assessment:   Well woman visit with normal exam. Mixed incontinence.  Pelvic pain.  Atrophy. FH cancers.  Osteopenia.  PCP managing.   Plan: Mammogram screening discussed. Recommended self breast awareness. Pap and HR HPV as above. Guidelines for Calcium, Vitamin D, regular exercise program including cardiovascular and weight bearing exercise. Refill of vaginal estrogen.  Discussed potential effect on breast cancer. Return for pelvic ultrasound. Declines surgery for GSI.  Referral to [facility].   Referral to genetic counseling.  Follow up annually and prn.  After visit summary provided.

## 2017-06-11 NOTE — Patient Instructions (Signed)

## 2017-06-11 NOTE — Addendum Note (Signed)
Addended by: Yisroel Ramming, Dietrich Pates E on: 06/11/2017 02:52 PM   Modules accepted: Orders

## 2017-06-18 ENCOUNTER — Telehealth: Payer: Self-pay | Admitting: Genetic Counselor

## 2017-06-18 NOTE — Telephone Encounter (Signed)
Scheduled an appt on Wed. 07/30/17 @ 1:00pm. Pt made aware that she will need to arrive 30 mins before her appt.

## 2017-06-26 ENCOUNTER — Ambulatory Visit (INDEPENDENT_AMBULATORY_CARE_PROVIDER_SITE_OTHER): Payer: 59

## 2017-06-26 ENCOUNTER — Ambulatory Visit (INDEPENDENT_AMBULATORY_CARE_PROVIDER_SITE_OTHER): Payer: 59 | Admitting: Obstetrics and Gynecology

## 2017-06-26 ENCOUNTER — Other Ambulatory Visit: Payer: Self-pay

## 2017-06-26 ENCOUNTER — Encounter: Payer: Self-pay | Admitting: Obstetrics and Gynecology

## 2017-06-26 VITALS — BP 118/72 | HR 76 | Wt 153.0 lb

## 2017-06-26 DIAGNOSIS — R102 Pelvic and perineal pain: Secondary | ICD-10-CM

## 2017-06-26 DIAGNOSIS — N3281 Overactive bladder: Secondary | ICD-10-CM

## 2017-06-26 MED ORDER — OXYBUTYNIN CHLORIDE ER 10 MG PO TB24: 10.0000 mg | ORAL_TABLET | Freq: Every day | ORAL | 1 refills | Status: DC

## 2017-06-26 NOTE — Progress Notes (Signed)
Encounter reviewed by Dr. Aundria Rud.  Patient had a post void residual check with [place] and had little left behind.

## 2017-06-26 NOTE — Patient Instructions (Addendum)
Overactive Bladder, Adult Overactive bladder is a group of urinary symptoms. With overactive bladder, you may suddenly feel the need to pass urine (urinate) right away. After feeling this sudden urge, you might also leak urine if you cannot get to the bathroom fast enough (urinary incontinence). These symptoms might interfere with your daily work or social activities. Overactive bladder symptoms may also wake you up at night. Overactive bladder affects the nerve signals between your bladder and your brain. Your bladder may get the signal to empty before it is full. Very sensitive muscles can also make your bladder squeeze too soon. What are the causes? Many things can cause an overactive bladder. Possible causes include:  Urinary tract infection.  Infection of nearby tissues, such as the prostate.  Prostate enlargement.  Being pregnant with twins or more (multiples).  Surgery on the uterus or urethra.  Bladder stones, inflammation, or tumors.  Drinking too much caffeine or alcohol.  Certain medicines, especially those that you take to help your body get rid of extra fluid (diuretics) by increasing urine production.  Muscle or nerve weakness, especially from: ? A spinal cord injury. ? Stroke. ? Multiple sclerosis. ? Parkinson disease.  Diabetes. This can cause a high urine volume that fills the bladder so quickly that the normal urge to urinate is triggered very strongly.  Constipation. A buildup of too much stool can put pressure on your bladder.  What increases the risk? You may be at greater risk for overactive bladder if you:  Are an older adult.  Smoke.  Are going through menopause.  Have prostate problems.  Have a neurological disease, such as stroke, dementia, Parkinson disease, or multiple sclerosis (MS).  Eat or drink things that irritate the bladder. These include alcohol, spicy food, and caffeine.  Are overweight or obese.  What are the signs or  symptoms? The signs and symptoms of an overactive bladder include:  Sudden, strong urges to urinate.  Leaking urine.  Urinating eight or more times per day.  Waking up to urinate two or more times per night.  How is this diagnosed? Your health care provider may suspect overactive bladder based on your symptoms. The health care provider will do a physical exam and take your medical history. Blood or urine tests may also be done. For example, you might need to have a bladder function test to check how well you can hold your urine. You might also need to see a health care provider who specializes in the urinary tract (urologist). How is this treated? Treatment for overactive bladder depends on the cause of your condition and whether it is mild or severe. Certain treatments can be done in your health care provider's office or clinic. You can also make lifestyle changes at home. Options include: Behavioral Treatments  Biofeedback. A specialist uses sensors to help you become aware of your body's signals.  Keeping a daily log of when you need to urinate and what happens after the urge. This may help you manage your condition.  Bladder training. This helps you learn to control the urge to urinate by following a schedule that directs you to urinate at regular intervals (timed voiding). At first, you might have to wait a few minutes after feeling the urge. In time, you should be able to schedule bathroom visits an hour or more apart.  Kegel exercises. These are exercises to strengthen the pelvic floor muscles, which support the bladder. Toning these muscles can help you control urination, even if your  bladder muscles are overactive. A specialist will teach you how to do these exercises correctly. They require daily practice.  Weight loss. If you are obese or overweight, losing weight might relieve your symptoms of overactive bladder. Talk to your health care provider about losing weight and whether  there is a specific program or method that would work best for you.  Diet change. This might help if constipation is making your overactive bladder worse. Your health care provider or a dietitian can explain ways to change what you eat to ease constipation. You might also need to consume less alcohol and caffeine or drink other fluids at different times of the day.  Stopping smoking.  Wearing pads to absorb leakage while you wait for other treatments to take effect. Physical Treatments  Electrical stimulation. Electrodes send gentle pulses of electricity to strengthen the nerves or muscles that help to control the bladder. Sometimes, the electrodes are placed outside of the body. In other cases, they might be placed inside the body (implanted). This treatment can take several months to have an effect.  Supportive devices. Women may need a plastic device that fits into the vagina and supports the bladder (pessary). Medicines Several medicines can help treat overactive bladder and are usually used along with other treatments. Some are injected into the muscles involved in urination. Others come in pill form. Your health care provider may prescribe:  Antispasmodics. These medicines block the signals that the nerves send to the bladder. This keeps the bladder from releasing urine at the wrong time.  Tricyclic antidepressants. These types of antidepressants also relax bladder muscles.  Surgery  You may have a device implanted to help manage the nerve signals that indicate when you need to urinate.  You may have surgery to implant electrodes for electrical stimulation.  Sometimes, very severe cases of overactive bladder require surgery to change the shape of the bladder. Follow these instructions at home:  Take medicines only as directed by your health care provider.  Use any implants or a pessary as directed by your health care provider.  Make any diet or lifestyle changes that are  recommended by your health care provider. These might include: ? Drinking less fluid or drinking at different times of the day. If you need to urinate often during the night, you may need to stop drinking fluids early in the evening. ? Cutting down on caffeine or alcohol. Both can make an overactive bladder worse. Caffeine is found in coffee, tea, and sodas. ? Doing Kegel exercises to strengthen muscles. ? Losing weight if you need to. ? Eating a healthy and balanced diet to prevent constipation.  Keep a journal or log to track how much and when you drink and also when you feel the need to urinate. This will help your health care provider to monitor your condition. Contact a health care provider if:  Your symptoms do not get better after treatment.  Your pain and discomfort are getting worse.  You have more frequent urges to urinate.  You have a fever. Get help right away if: You are not able to control your bladder at all. This information is not intended to replace advice given to you by your health care provider. Make sure you discuss any questions you have with your health care provider. Document Released: 01/26/2009 Document Revised: 09/07/2015 Document Reviewed: 08/25/2013 Elsevier Interactive Patient Education  2018 Else[employer] Inc. Oxybutynin extended-release tablets What is this medicine? OXYBUTYNIN (ox i BYOO ti nin) is used to   treat overactive bladder. This medicine reduces the amount of bathroom visits. It may also help to control wetting accidents. This medicine may be used for other purposes; ask your health care provider or pharmacist if you have questions. COMMON BRAND NAME(S): Ditropan XL What should I tell my health care provider before I take this medicine? They need to know if you have any of these conditions: -autonomic neuropathy -dementia -difficulty passing urine -glaucoma -intestinal obstruction -kidney disease -liver disease -myasthenia gravis -Parkinson's  disease -an unusual or allergic reaction to oxybutynin, other medicines, foods, dyes, or preservatives -pregnant or trying to get pregnant -breast-feeding How should I use this medicine? Take this medicine by mouth with a glass of water. Swallow whole, do not crush, cut, or chew. Follow the directions on the prescription label. You can take this medicine with or without food. Take your doses at regular intervals. Do not take your medicine more often than directed. Talk to your pediatrician regarding the use of this medicine in children. Special care may be needed. While this drug may be prescribed for children as young as 6 years for selected conditions, precautions do apply. Overdosage: If you think you have taken too much of this medicine contact a poison control center or emergency room at once. NOTE: This medicine is only for you. Do not share this medicine with others. What if I miss a dose? If you miss a dose, take it as soon as you can. If it is almost time for your next dose, take only that dose. Do not take double or extra doses. What may interact with this medicine? -antihistamines for allergy, cough and cold -atropine -certain medicines for bladder problems like oxybutynin, tolterodine -certain medicines for Parkinson's disease like benztropine, trihexyphenidyl -certain medicines for stomach problems like dicyclomine, hyoscyamine -certain medicines for travel sickness like scopolamine -clarithromycin -erythromycin -ipratropium -medicines for fungal infections, like fluconazole, itraconazole, ketoconazole or voriconazole This list may not describe all possible interactions. Give your health care provider a list of all the medicines, herbs, non-prescription drugs, or dietary supplements you use. Also tell them if you smoke, drink alcohol, or use illegal drugs. Some items may interact with your medicine. What should I watch for while using this medicine? It may take a few weeks to  notice the full benefit from this medicine. You may need to limit your intake tea, coffee, caffeinated sodas, and alcohol. These drinks may make your symptoms worse. You may get drowsy or dizzy. Do not drive, use machinery, or do anything that needs mental alertness until you know how this medicine affects you. Do not stand or sit up quickly, especially if you are an older patient. This reduces the risk of dizzy or fainting spells. Alcohol may interfere with the effect of this medicine. Avoid alcoholic drinks. Your mouth may get dry. Chewing sugarless gum or sucking hard candy, and drinking plenty of water may help. Contact your doctor if the problem does not go away or is severe. This medicine may cause dry eyes and blurred vision. If you wear contact lenses, you may feel some discomfort. Lubricating drops may help. See your eyecare professional if the problem does not go away or is severe. You may notice the shells of the tablets in your stool from time to time. This is normal. Avoid extreme heat. This medicine can cause you to sweat less than normal. Your body temperature could increase to dangerous levels, which may lead to heat stroke. What side effects may I notice from receiving   this medicine? Side effects that you should report to your doctor or health care professional as soon as possible: -allergic reactions like skin rash, itching or hives, swelling of the face, lips, or tongue -agitation -breathing problems -confusion -fever -flushing (reddening of the skin) -hallucinations -memory loss -pain or difficulty passing urine -palpitations -unusually weak or tired Side effects that usually do not require medical attention (report to your doctor or health care professional if they continue or are bothersome): -constipation -headache -sexual difficulties (impotence) This list may not describe all possible side effects. Call your doctor for medical advice about side effects. You may report  side effects to FDA at 1-800-FDA-1088. Where should I keep my medicine? Keep out of the reach of children. Store at room temperature between 15 and 30 degrees C (59 and 86 degrees F). Protect from moisture and humidity. Throw away any unused medicine after the expiration date. NOTE: This sheet is a summary. It may not cover all possible information. If you have questions about this medicine, talk to your doctor, pharmacist, or health care provider.  2018 Elsevier/Gold Standard (2013-06-17 10:57:06)  

## 2017-06-26 NOTE — Progress Notes (Signed)
GYNECOLOGY  VISIT   HPI: 62 y.o.   Married  Caucasian  female   G2P2 with Patient's last menstrual period was 04/15/2008.   here for ultrasound for pelvic pain.   Feeling a dull discomfort in her bladder since the Holidays 2018.  Negative urine dip 06/11/17.  Difficulty sleeping due to pain.  Not as noticeable during the day.  Notices more after voiding at night.  Voiding every 2 hours during the day.  Up once per night to void.   Has not taken medication for overactive bladder.  Has urgency and frequency.  Drinks a lot of water all day long.  Caffeines - 4 per day.  Using vaginal estrogens 2 -3 times per week.  GYNECOLOGIC HISTORY: Patient's last menstrual period was 04/15/2008. Contraception:  Postmenopasual Menopausal hormone therapy:  Estrace vaginal cream Last mammogram:  06/21/16 BIRADS 1 negative/density b Last pap smear:   05-29-15 Neg:Neg HR HPV        OB History    Gravida Para Term Preterm AB Living   2 2       2    SAB TAB Ectopic Multiple Live Births                     Patient Active Problem List   Diagnosis Date Noted  . Estrogen deficiency 11/17/2016  . Sun-damaged skin 11/17/2016  . Low back pain 11/17/2016  . 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 Osteopenia 11/12/2016  . Insomnia 08/15/2015  . Hyperlipidemia, mixed 08/21/2014  . Headache 06/07/2014  . Preventative health care 02/07/2013  . Hx: UTI (urinary tract infection) 02/07/2013  . Osteoarthritis of both knees 02/07/2013    Past Medical History:  Diagnosis Date  . Arthritis of right knee 02/07/2013  . Chicken pox as a child  . Headache 06/07/2014  . Hx: UTI (urinary tract infection) 02/07/2013  . Hyperlipidemia, mixed 08/21/2014  . Insomnia 08/15/2015  . Low back pain 11/17/2016  . Measles as a child  . Mumps as a child  . Osteoarthritis of both knees 02/07/2013  . Osteopenia 11/12/2016  . Preventative health care 02/07/2013  . 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 TMJ syndrome   . UTI  (urinary tract infection)     Past Surgical History:  Procedure Laterality Date  . APPENDECTOMY  3 rd grade  . MOLE REMOVAL     benign- mole on right thigh  . scar tissue break up  2006   Dr Alfred Levins  . WISDOM TOOTH EXTRACTION  as a teenager    Current Outpatient Medications  Medication Sig Dispense Refill  . Ascorbic Acid (VITAMIN C) 1000 MG tablet Take 1,000 mg by mouth 2 (two) times daily.    . Calcium Carbonate-Vitamin D (CALCIUM 600+D) 600-400 MG-UNIT tablet Take 1 tablet by mouth daily.    Marland Kitchen estradiol (ESTRACE) 0.1 MG/GM vaginal cream Use 1/2 g vaginally two or three times per week as needed to maintain symptom relief. 42.5 g 2  . KRILL OIL PO Take by mouth.    Marland Kitchen LIDODERM 5 % Place 1 patch onto the skin every 12 (twelve) hours as needed. Pt applies as needed 30 patch 2  . Magnesium 250 MG TABS Take by mouth daily.    . Melatonin 5 MG TABS Take 1 tablet by mouth at bedtime as needed.      No current facility-administered medications for this visit.      ALLERGIES: Penicillins  Family History  Problem Relation Age of Onset  .  Osteoporosis Mother   . Diabetes Mother        type 2  . Cancer Mother 34       stomach  . Colon polyps Mother        benign  . Colon cancer Mother 68       Tumor in colon removed Stage 2  . Hypertension Father   . Hyperlipidemia Father   . Other Sister        herniated disk in back, low blood pressure  . Hernia Brother        X 2  . Hypertension Sister   . Depression Sister   . Cancer Maternal Grandmother        ? lung vs breast  . Cancer Maternal Aunt     Social History   Socioeconomic History  . Marital status: Married    Spouse name: Not on file  . Number of children: Not on file  . Years of education: Not on file  . Highest education level: Not on file  Social Needs  . Financial resource strain: Not on file  . Food insecurity - worry: Not on file  . Food insecurity - inability: Not on file  . Transportation needs -  medical: Not on file  . Transportation needs - non-medical: Not on file  Occupational History  . Not on file  Tobacco Use  . Smoking status: Never Smoker  . Smokeless tobacco: Never Used  Substance and Sexual Activity  . Alcohol use: Yes    Alcohol/week: 0.6 oz    Types: 1 Standard drinks or equivalent per week    Comment: occasional wine 2-3/month  . Drug use: No  . Sexual activity: Yes    Partners: Male    Birth control/protection: Post-menopausal    Comment: lives with husband, exercising regularly, no dietary restrictions  Other Topics Concern  . Not on file  Social History Narrative  . Not on file    ROS:  Pertinent items are noted in HPI.  PHYSICAL EXAMINATION:    BP 118/72 (BP Location: Right Arm, Patient Position: Sitting, Cuff Size: Normal)   Pulse 76   Wt 153 lb (69.4 kg)   LMP 04/15/2008   HC 16" (40.6 cm)   BMI 24.69 kg/m     General appearance: alert, cooperative and appears stated age   Pelvic US Uterus normal. EMS 1.18 mm. Right ovary not seen.  Left ovary normal.  No free fluid.  ASSESSMENT  Pelvic pain.  Overactive bladder. FH colon cancer.  Colonoscopy done last year.   PLAN  Discussed possible causes of pelvic pain including musculoskeletal. Reduce caffeine use.  Start Ditropan XL in 2 weeks if still having urgency and frequency after reducing caffeine use. I discussed potential side effects.  Colace or Miralax for any constipation symptoms.  Pelvic floor PT.   FU in 6 weeks.   An After Visit Summary was printed and given to the patient.  __25____ minutes face to face time of which over 50% was spent in counseling.

## 2017-07-30 ENCOUNTER — Encounter: Payer: 59 | Admitting: Genetic Counselor

## 2017-07-30 ENCOUNTER — Other Ambulatory Visit: Payer: 59

## 2017-08-05 ENCOUNTER — Telehealth: Payer: Self-pay | Admitting: Obstetrics and Gynecology

## 2017-08-05 NOTE — Telephone Encounter (Signed)
Patient cancelled 6 week follow up appointment. Stated that everything has improved and she is still doing physical therapy with a urologist.

## 2017-08-05 NOTE — Telephone Encounter (Signed)
Thank you for the update.  I have closed the encounter.  

## 2017-08-07 ENCOUNTER — Ambulatory Visit: Payer: 59 | Admitting: Obstetrics and Gynecology

## 2017-11-14 ENCOUNTER — Encounter: Payer: 59 | Admitting: Family Medicine

## 2018-01-05 ENCOUNTER — Encounter: Payer: Self-pay | Admitting: Family Medicine

## 2018-01-05 ENCOUNTER — Ambulatory Visit (INDEPENDENT_AMBULATORY_CARE_PROVIDER_SITE_OTHER): Payer: 59 | Admitting: Family Medicine

## 2018-01-05 DIAGNOSIS — Z0000 Encounter for general adult medical examination without abnormal findings: Secondary | ICD-10-CM

## 2018-01-05 DIAGNOSIS — M8580 Other specified disorders of bone density and structure, unspecified site: Secondary | ICD-10-CM | POA: Diagnosis not present

## 2018-01-05 DIAGNOSIS — R350 Frequency of micturition: Secondary | ICD-10-CM | POA: Diagnosis not present

## 2018-01-05 DIAGNOSIS — E782 Mixed hyperlipidemia: Secondary | ICD-10-CM

## 2018-01-05 LAB — LIPID PANEL
CHOL/HDL RATIO: 3
CHOLESTEROL: 190 mg/dL (ref 0–200)
HDL: 58.6 mg/dL (ref >39.00)
NONHDL: 131.24
TRIGLYCERIDES: 215 mg/dL — AB (ref 0.0–149.0)
VLDL: 43 mg/dL — ABNORMAL HIGH (ref 0.0–40.0)

## 2018-01-05 LAB — CBC
HCT: 41 % (ref 36.0–46.0)
HEMOGLOBIN: 13.7 g/dL (ref 12.0–15.0)
MCHC: 33.5 g/dL (ref 30.0–36.0)
MCV: 87.3 fl (ref 78.0–100.0)
PLATELETS: 291 10*3/uL (ref 150.0–400.0)
RBC: 4.7 Mil/uL (ref 3.87–5.11)
RDW: 13.5 % (ref 11.5–15.5)
WBC: 6 10*3/uL (ref 4.0–10.5)

## 2018-01-05 LAB — COMPREHENSIVE METABOLIC PANEL
ALBUMIN: 4.5 g/dL (ref 3.5–5.2)
ALT: 18 U/L (ref 0–35)
AST: 17 U/L (ref 0–37)
Alkaline Phosphatase: 76 U/L (ref 39–117)
BUN: 10 mg/dL (ref 6–23)
CALCIUM: 9.8 mg/dL (ref 8.4–10.5)
CHLORIDE: 101 meq/L (ref 96–112)
CO2: 31 meq/L (ref 19–32)
CREATININE: 0.64 mg/dL (ref 0.40–1.20)
GFR: 100.21 mL/min (ref >60.00)
Glucose, Bld: 97 mg/dL (ref 70–99)
Potassium: 4.2 mEq/L (ref 3.5–5.1)
Sodium: 137 mEq/L (ref 135–145)
Total Bilirubin: 0.4 mg/dL (ref 0.2–1.2)
Total Protein: 7 g/dL (ref 6.0–8.3)

## 2018-01-05 LAB — TSH: TSH: 1.51 u[IU]/mL (ref 0.35–4.50)

## 2018-01-05 LAB — LDL CHOLESTEROL, DIRECT: Direct LDL: 98 mg/dL

## 2018-01-05 NOTE — Patient Instructions (Addendum)
CoQ10 enzyme daily  DASH or MIND or Mediterranean diets  60-80 oz of clear fluids daily   Recommend calcium intake of 1200 to 1500 mg daily, divided into roughly 3 doses. Best source is the diet and a single dairy serving is about 500 mg, a supplement of calcium citrate once or twice daily to balance diet is fine if not getting enough in diet. Also need Vitamin D 2000 IU caps, 1 cap daily if not already taking vitamin D. Also recommend weight baring exercise on hips and upper body to keep bones strong  Bring [place] a copy of your advanced directives if you are willing  Preventive Care 61-64 Years, Female Preventive care refers to lifestyle choices and visits with your health care provider that can promote health and wellness. What does preventive care include?  A yearly physical exam. This is also called an annual well check.  Dental exams once or twice a year.  Routine eye exams. Ask your health care provider how often you should have your eyes checked.  Personal lifestyle choices, including: ? Daily care of your teeth and gums. ? Regular physical activity. ? Eating a healthy diet. ? Avoiding tobacco and drug use. ? Limiting alcohol use. ? Practicing safe sex. ? Taking low-dose aspirin daily starting at age 61. ? Taking vitamin and mineral supplements as recommended by your health care provider. What happens during an annual well check? The services and screenings done by your health care provider during your annual well check will depend on your age, overall health, lifestyle risk factors, and family history of disease. Counseling Your health care provider may ask you questions about your:  Alcohol use.  Tobacco use.  Drug use.  Emotional well-being.  Home and relationship well-being.  Sexual activity.  Eating habits.  Work and work Statistician.  Method of birth control.  Menstrual cycle.  Pregnancy history.  Screening You may have the following tests or  measurements:  Height, weight, and BMI.  Blood pressure.  Lipid and cholesterol levels. These may be checked every 5 years, or more frequently if you are over 56 years old.  Skin check.  Lung cancer screening. You may have this screening every year starting at age 61 if you have a 30-pack-year history of smoking and currently smoke or have quit within the past 15 years.  Fecal occult blood test (FOBT) of the stool. You may have this test every year starting at age 61.  Flexible sigmoidoscopy or colonoscopy. You may have a sigmoidoscopy every 5 years or a colonoscopy every 10 years starting at age 61.  Hepatitis C blood test.  Hepatitis B blood test.  Sexually transmitted disease (STD) testing.  Diabetes screening. This is done by checking your blood sugar (glucose) after you have not eaten for a while (fasting). You may have this done every 1-3 years.  Mammogram. This may be done every 1-2 years. Talk to your health care provider about when you should start having regular mammograms. This may depend on whether you have a family history of breast cancer.  BRCA-related cancer screening. This may be done if you have a family history of breast, ovarian, tubal, or peritoneal cancers.  Pelvic exam and Pap test. This may be done every 3 years starting at age 61. Starting at age 16, this may be done every 5 years if you have a Pap test in combination with an HPV test.  Bone density scan. This is done to screen for osteoporosis. You may have  this scan if you are at high risk for osteoporosis.  Discuss your test results, treatment options, and if necessary, the need for more tests with your health care provider. Vaccines Your health care provider may recommend certain vaccines, such as:  Influenza vaccine. This is recommended every year.  Tetanus, diphtheria, and acellular pertussis (Tdap, Td) vaccine. You may need a Td booster every 10 years.  Varicella vaccine. You may need this if  you have not been vaccinated.  Zoster vaccine. You may need this after age 61.  Measles, mumps, and rubella (MMR) vaccine. You may need at least one dose of MMR if you were born in 1957 or later. You may also need a second dose.  Pneumococcal 13-valent conjugate (PCV13) vaccine. You may need this if you have certain conditions and were not previously vaccinated.  Pneumococcal polysaccharide (PPSV23) vaccine. You may need one or two doses if you smoke cigarettes or if you have certain conditions.  Meningococcal vaccine. You may need this if you have certain conditions.  Hepatitis A vaccine. You may need this if you have certain conditions or if you travel or work in places where you may be exposed to hepatitis A.  Hepatitis B vaccine. You may need this if you have certain conditions or if you travel or work in places where you may be exposed to hepatitis B.  Haemophilus influenzae type b (Hib) vaccine. You may need this if you have certain conditions.  Talk to your health care provider about which screenings and vaccines you need and how often you need them. This information is not intended to replace advice given to you by your health care provider. Make sure you discuss any questions you have with your health care provider. Document Released: 04/28/2015 Document Revised: 12/20/2015 Document Reviewed: 01/31/2015 Elsevier Interactive Patient Education  [employer].

## 2018-01-05 NOTE — Assessment & Plan Note (Signed)
Encouraged to get adequate exercise, calcium and vitamin d intake 

## 2018-01-05 NOTE — Assessment & Plan Note (Signed)
Encouraged heart healthy diet, increase exercise, avoid trans fats, consider a krill oil cap daily 

## 2018-01-05 NOTE — Assessment & Plan Note (Signed)
She had discussed with Dr Quincy Simmonds, GYN and they set her up with PT for Pelvic floor and she is doing much better. No meds

## 2018-01-05 NOTE — Assessment & Plan Note (Signed)
Patient encouraged to maintain heart healthy diet, regular exercise, adequate sleep. Consider daily probiotics. Take medications as prescribed 

## 2018-01-06 ENCOUNTER — Telehealth: Payer: Self-pay

## 2018-01-06 NOTE — Telephone Encounter (Signed)
Copied from [place] [id number]. Topic: Inquiry >> Jan 05, 2018  1:36 PM Scherrie Gerlach wrote: Reason for CRM: ken with [facility] states you can access pt's shot infor at [place]. (ie shingles) Does not know why they need to get involved. Marlow Baars [address], [place], [place] [address] [phone]

## 2018-01-07 NOTE — Progress Notes (Signed)
Subjective:    Patient ID: Ellen Morrow, female    DOB: 1955-07-17, 62 y.o.   MRN: [id number]  Chief Complaint  Patient presents with  . Annual Exam    Here for annual    HPI Patient is in today for annual preventative exam. She feels well. No recent febrile illness or hospitalizations. No acute concerns. She is trying to maintain a heart healthy diet and stay active. Colonoscopy is due in 2023 and she has no change in bowels or GI concerns. She is doing well with activities of daily living and staying active exercising regularly. Denies CP/palp/SOB/HA/congestion/fevers/GI or GU c/o. Taking meds as prescribed  Past Medical History:  Diagnosis Date  . Arthritis of right knee 02/07/2013  . Chicken pox as a child  . Headache 06/07/2014  . Hx: UTI (urinary tract infection) 02/07/2013  . Hyperlipidemia, mixed 08/21/2014  . Insomnia 08/15/2015  . Low back pain 11/17/2016  . Measles as a child  . Mumps as a child  . Osteoarthritis of both knees 02/07/2013  . Osteopenia 11/12/2016  . Preventative health care 02/07/2013  . 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 TMJ syndrome   . UTI (urinary tract infection)     Past Surgical History:  Procedure Laterality Date  . APPENDECTOMY  3 rd grade  . MOLE REMOVAL     benign- mole on right thigh  . scar tissue break up  2006   Dr Alfred Levins  . WISDOM TOOTH EXTRACTION  as a teenager    Family History  Problem Relation Age of Onset  . Osteoporosis Mother   . Diabetes Mother        type 2  . Cancer Mother 50       stomach  . Colon polyps Mother        benign  . Colon cancer Mother 18       Tumor in colon removed Stage 2  . Hypertension Father   . Hyperlipidemia Father   . Other Sister        herniated disk in back, low blood pressure  . Hernia Brother        X 2  . ADD / ADHD Brother   . Hypertension Sister   . Depression Sister   . Cancer Maternal Grandmother        ? lung vs breast  . Cancer Maternal Aunt   . COPD Paternal 68    . Dementia Paternal Aunt     Social History   Socioeconomic History  . Marital status: Married    Spouse name: Not on file  . Number of children: Not on file  . Years of education: Not on file  . Highest education level: Not on file  Occupational History  . Not on file  Social Needs  . Financial resource strain: Not on file  . Food insecurity:    Worry: Not on file    Inability: Not on file  . Transportation needs:    Medical: Not on file    Non-medical: Not on file  Tobacco Use  . Smoking status: Never Smoker  . Smokeless tobacco: Never Used  Substance and Sexual Activity  . Alcohol use: Yes    Alcohol/week: 1.0 standard drinks    Types: 1 Standard drinks or equivalent per week    Comment: occasional wine 2-3/month  . Drug use: No  . Sexual activity: Yes    Partners: Male    Birth control/protection: Post-menopausal    Comment:  lives with husband, exercising regularly, no dietary restrictions  Lifestyle  . Physical activity:    Days per week: Not on file    Minutes per session: Not on file  . Stress: Not on file  Relationships  . Social connections:    Talks on phone: Not on file    Gets together: Not on file    Attends religious service: Not on file    Active member of club or organization: Not on file    Attends meetings of clubs or organizations: Not on file    Relationship status: Not on file  . Intimate partner violence:    Fear of current or ex partner: Not on file    Emotionally abused: Not on file    Physically abused: Not on file    Forced sexual activity: Not on file  Other Topics Concern  . Not on file  Social History Narrative  . Not on file    Outpatient Medications Prior to Visit  Medication Sig Dispense Refill  . Ascorbic Acid (VITAMIN C) 1000 MG tablet Take 1,000 mg by mouth daily.    . Calcium Carbonate-Vitamin D (CALCIUM 600+D) 600-400 MG-UNIT tablet Take 1 tablet by mouth daily.    . cyclobenzaprine (FLEXERIL) 10 MG tablet Taking  1/2 a day  0  . estradiol (ESTRACE VAGINAL) 0.1 MG/GM vaginal cream Place 1 Applicatorful vaginally at bedtime.    Marland Kitchen LIDODERM 5 % Place 1 patch onto the skin every 12 (twelve) hours as needed. Pt applies as needed 30 patch 2  . Melatonin 5 MG TABS Take 1 tablet by mouth at bedtime as needed.     . Omega-3 Fatty Acids (FISH OIL) 1200 MG CAPS Take by mouth.    Marland Kitchen KRILL OIL PO Take by mouth.    . Ascorbic Acid (VITAMIN C) 1000 MG tablet Take 1,000 mg by mouth 2 (two) times daily.    Marland Kitchen estradiol (ESTRACE) 0.1 MG/GM vaginal cream Use 1/2 g vaginally two or three times per week as needed to maintain symptom relief. (Patient not taking: Reported on 01/05/2018) 42.5 g 2  . Magnesium 250 MG TABS Take by mouth daily.    Marland Kitchen oxybutynin (DITROPAN XL) 10 MG 24 hr tablet Take 1 tablet (10 mg total) by mouth at bedtime. (Patient not taking: Reported on 01/05/2018) 30 tablet 1   No facility-administered medications prior to visit.     Allergies  Allergen Reactions  . Penicillins Rash    Review of Systems  Constitutional: Negative for chills, fever and malaise/fatigue.  HENT: Negative for congestion and hearing loss.   Eyes: Negative for discharge.  Respiratory: Negative for cough, sputum production and shortness of breath.   Cardiovascular: Negative for chest pain, palpitations and leg swelling.  Gastrointestinal: Negative for abdominal pain, blood in stool, constipation, diarrhea, heartburn, nausea and vomiting.  Genitourinary: Negative for dysuria, frequency, hematuria and urgency.  Musculoskeletal: Negative for back pain, falls and myalgias.  Skin: Negative for rash.  Neurological: Negative for dizziness, sensory change, loss of consciousness, weakness and headaches.  Endo/Heme/Allergies: Negative for environmental allergies. Does not bruise/bleed easily.  Psychiatric/Behavioral: Negative for depression and suicidal ideas. The patient is not nervous/anxious and does not have insomnia.          Objective:    Physical Exam  Constitutional: She is oriented to person, place, and time. She appears well-developed and well-nourished. No distress.  HENT:  Head: Normocephalic and atraumatic.  Eyes: Conjunctivae are normal.  Neck: Neck supple. No  thyromegaly present.  Cardiovascular: Normal rate, regular rhythm and normal heart sounds.  No murmur heard. Pulmonary/Chest: Effort normal and breath sounds normal. No respiratory distress.  Abdominal: Soft. Bowel sounds are normal. She exhibits no distension and no mass. There is no tenderness.  Musculoskeletal: She exhibits no edema.  Lymphadenopathy:    She has no cervical adenopathy.  Neurological: She is alert and oriented to person, place, and time.  Skin: Skin is warm and dry.  Psychiatric: She has a normal mood and affect. Her behavior is normal.    BP 110/70 (BP Location: Left Arm, Patient Position: Sitting, Cuff Size: Small)   Pulse 68   Temp 98.1 F (36.7 C) (Oral)   Resp 16   Ht 5\' 6"  (1.676 m)   Wt 151 lb 12.8 oz (68.9 kg)   LMP 04/15/2008   SpO2 97%   BMI 24.50 kg/m  Wt Readings from Last 3 Encounters:  01/05/18 151 lb 12.8 oz (68.9 kg)  06/26/17 153 lb (69.4 kg)  06/11/17 153 lb (69.4 kg)     Lab Results  Component Value Date   WBC 6.0 01/05/2018   HGB 13.7 01/05/2018   HCT 41.0 01/05/2018   PLT 291.0 01/05/2018   GLUCOSE 97 01/05/2018   CHOL 190 01/05/2018   TRIG 215.0 (H) 01/05/2018   HDL 58.60 01/05/2018   LDLDIRECT 98.0 01/05/2018   LDLCALC 130 (H) 11/12/2016   ALT 18 01/05/2018   AST 17 01/05/2018   NA 137 01/05/2018   K 4.2 01/05/2018   CL 101 01/05/2018   CREATININE 0.64 01/05/2018   BUN 10 01/05/2018   CO2 31 01/05/2018   TSH 1.51 01/05/2018    Lab Results  Component Value Date   TSH 1.51 01/05/2018   Lab Results  Component Value Date   WBC 6.0 01/05/2018   HGB 13.7 01/05/2018   HCT 41.0 01/05/2018   MCV 87.3 01/05/2018   PLT 291.0 01/05/2018   Lab Results  Component Value  Date   NA 137 01/05/2018   K 4.2 01/05/2018   CO2 31 01/05/2018   GLUCOSE 97 01/05/2018   BUN 10 01/05/2018   CREATININE 0.64 01/05/2018   BILITOT 0.4 01/05/2018   ALKPHOS 76 01/05/2018   AST 17 01/05/2018   ALT 18 01/05/2018   PROT 7.0 01/05/2018   ALBUMIN 4.5 01/05/2018   CALCIUM 9.8 01/05/2018   GFR 100.21 01/05/2018   Lab Results  Component Value Date   CHOL 190 01/05/2018   Lab Results  Component Value Date   HDL 58.60 01/05/2018   Lab Results  Component Value Date   LDLCALC 130 (H) 11/12/2016   Lab Results  Component Value Date   TRIG 215.0 (H) 01/05/2018   Lab Results  Component Value Date   CHOLHDL 3 01/05/2018   No results found for: HGBA1C     Assessment & Plan:   Problem List Items Addressed This Visit    Preventative health care    Patient encouraged to maintain heart healthy diet, regular exercise, adequate sleep. Consider daily probiotics. Take medications as prescribed.       Relevant Orders   CBC (Completed)   Comprehensive metabolic panel (Completed)   TSH (Completed)   Hyperlipidemia, mixed    Encouraged heart healthy diet, increase exercise, avoid trans fats, consider a krill oil cap daily      Relevant Orders   Lipid panel (Completed)   Osteopenia    Encouraged to get adequate exercise, calcium and vitamin d intake  Urinary frequency    She had discussed with Dr Quincy Simmonds, GYN and they set her up with PT for Pelvic floor and she is doing much better. No meds         I have discontinued Shlonda Dolloff. Ingles's Magnesium and oxybutynin. I am also having her maintain her KRILL OIL PO, Calcium Carbonate-Vitamin D, Melatonin, LIDODERM, cyclobenzaprine, vitamin C, estradiol, and Fish Oil.  No orders of the defined types were placed in this encounter.    Penni Homans, MD

## 2018-05-20 ENCOUNTER — Ambulatory Visit: Payer: 59 | Admitting: Sports Medicine

## 2018-05-21 ENCOUNTER — Ambulatory Visit: Payer: 59 | Admitting: Sports Medicine

## 2018-06-17 ENCOUNTER — Other Ambulatory Visit (HOSPITAL_COMMUNITY)
Admission: RE | Admit: 2018-06-17 | Discharge: 2018-06-17 | Disposition: A | Discharge status: Home / Self Care | Payer: 59 | Source: Ambulatory Visit | Attending: Obstetrics and Gynecology | Admitting: Obstetrics and Gynecology

## 2018-06-17 ENCOUNTER — Ambulatory Visit: Payer: 59 | Admitting: Obstetrics and Gynecology

## 2018-06-17 ENCOUNTER — Encounter: Payer: Self-pay | Admitting: Obstetrics and Gynecology

## 2018-06-17 ENCOUNTER — Other Ambulatory Visit: Payer: Self-pay

## 2018-06-17 VITALS — BP 108/60 | HR 60 | Resp 14 | Ht 65.5 in | Wt 146.0 lb

## 2018-06-17 DIAGNOSIS — Z01419 Encounter for gynecological examination (general) (routine) without abnormal findings: Secondary | ICD-10-CM

## 2018-06-17 MED ORDER — ESTRADIOL 0.1 MG/GM VA CREA: TOPICAL_CREAM | VAGINAL | 1 refills | Status: DC

## 2018-06-17 NOTE — Progress Notes (Signed)
63 y.o. G2P2 Married Caucasian female here for annual exam.    Pelvic Pain is resolved.  Had a normal pelvic US.  Bladder control is better with her pelvic floor PT she has done.   PCP: Penni Homans, MD  Patient's last menstrual period was 04/15/2008.           Sexually active: Yes.   female The current method of family planning is post menopausal status.    Exercising: Yes.    aerobics 2 days/week and running Smoker:  no  Health Maintenance: Pap: 05-29-15 Neg:Neg HR HPV History of abnormal Pap:  no MMG: 06/2017 normal per patient--Appt. 07-06-18 Colonoscopy:  07/24/16 benign polyp removed; f/u 5 yrs BMD: 11-12-16  Result :Osteopenia--PCP TDaP:  06/2013 Gardasil:   no HIV: Donated blood in past Hep C: 06-07-16 Neg Screening Labs:  Hb today: PCP. Flu vaccine:  done   reports that she has never smoked. She has never used smokeless tobacco. She reports current alcohol use of about 1.0 standard drinks of alcohol per week. She reports that she does not use drugs.  Past Medical History:  Diagnosis Date  . Arthritis of right knee 02/07/2013  . Chicken pox as a child  . Headache 06/07/2014  . Hx: UTI (urinary tract infection) 02/07/2013  . Hyperlipidemia, mixed 08/21/2014  . Insomnia 08/15/2015  . Low back pain 11/17/2016  . Measles as a child  . Mumps as a child  . Osteoarthritis of both knees 02/07/2013  . Osteopenia 11/12/2016  . Preventative health care 02/07/2013  . 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 TMJ syndrome   . UTI (urinary tract infection)     Past Surgical History:  Procedure Laterality Date  . APPENDECTOMY  3 rd grade  . MOLE REMOVAL     benign- mole on right thigh  . scar tissue break up  2006   Dr Alfred Levins  . WISDOM TOOTH EXTRACTION  as a teenager    Current Outpatient Medications  Medication Sig Dispense Refill  . Ascorbic Acid (VITAMIN C) 1000 MG tablet Take 1,000 mg by mouth daily.    . Calcium Carbonate-Vitamin D (CALCIUM 600+D) 600-400 MG-UNIT tablet  Take 1 tablet by mouth daily.    . cyclobenzaprine (FLEXERIL) 10 MG tablet Taking 1/2 a day  0  . estradiol (ESTRACE VAGINAL) 0.1 MG/GM vaginal cream Place 1 Applicatorful vaginally at bedtime.    Marland Kitchen KRILL OIL PO Take by mouth.    Marland Kitchen LIDODERM 5 % Place 1 patch onto the skin every 12 (twelve) hours as needed. Pt applies as needed 30 patch 2  . Melatonin 5 MG TABS Take 1 tablet by mouth at bedtime as needed.     . Omega-3 Fatty Acids (FISH OIL) 1200 MG CAPS Take by mouth.     No current facility-administered medications for this visit.     Family History  Problem Relation Age of Onset  . Osteoporosis Mother   . Diabetes Mother        type 2  . Cancer Mother 65       stomach  . Colon polyps Mother        benign  . Colon cancer Mother 43       Tumor in colon removed Stage 2  . Hypertension Father   . Hyperlipidemia Father   . Other Sister        herniated disk in back, low blood pressure  . Hernia Brother        X 2  .  ADD / ADHD Brother   . Hypertension Sister   . Depression Sister   . Cancer Maternal Grandmother        ? lung vs breast  . Cancer Maternal Aunt   . COPD Paternal 41   . Dementia Paternal Aunt     Review of Systems  All other systems reviewed and are negative.   Exam:   BP 108/60 (BP Location: Right Arm, Patient Position: Sitting, Cuff Size: Normal)   Pulse 60   Resp 14   Ht 5' 5.5" (1.664 m)   Wt 146 lb (66.2 kg)   LMP 04/15/2008   BMI 23.93 kg/m     General appearance: alert, cooperative and appears stated age Head: Normocephalic, without obvious abnormality, atraumatic Neck: no adenopathy, supple, symmetrical, trachea midline and thyroid normal to inspection and palpation Lungs: clear to auscultation bilaterally Breasts: normal appearance, no masses or tenderness, No nipple retraction or dimpling, No nipple discharge or bleeding, No axillary or supraclavicular adenopathy Heart: regular rate and rhythm Abdomen: soft, non-tender; no masses, no  organomegaly Extremities: extremities normal, atraumatic, no cyanosis or edema Skin: Skin color, texture, turgor normal. No rashes or lesions Lymph nodes: Cervical, supraclavicular, and axillary nodes normal. No abnormal inguinal nodes palpated Neurologic: Grossly normal  Pelvic: External genitalia:  no lesions              Urethra:  normal appearing urethra with no masses, tenderness or lesions              Bartholins and Skenes: normal                 Vagina: normal appearing vagina with normal color and discharge, no lesions              Cervix: no lesions              Pap taken: Yes.   Bimanual Exam:  Uterus:  normal size, contour, position, consistency, mobility, non-tender              Adnexa: no mass, fullness, tenderness              Rectal exam: Yes.  .  Confirms.              Anus:  normal sphincter tone, no lesions  Chaperone was present for exam.  Assessment:   Well woman visit with normal exam. Mixed incontinence. Improved. Pelvic pain.  Resolved.  Atrophy. FH cancers.  Osteopenia.  PCP managing.   Plan: Mammogram screening. Recommended self breast awareness. Pap and HR HPV as above. Guidelines for Calcium, Vitamin D, regular exercise program including cardiovascular and weight bearing exercise. Refill of vaginal estrogen.  I discussed potential effect on breast cancer.  BMD and labs with PCP.  Follow up annually and prn.   After visit summary provided.

## 2018-06-17 NOTE — Patient Instructions (Signed)

## 2018-06-18 ENCOUNTER — Ambulatory Visit: Payer: 59 | Admitting: Obstetrics and Gynecology

## 2018-06-18 LAB — CYTOLOGY - PAP
Diagnosis: NEGATIVE
HPV (WINDOPATH): NOT DETECTED

## 2018-06-29 ENCOUNTER — Ambulatory Visit: Payer: 59 | Admitting: Obstetrics and Gynecology

## 2018-09-26 ENCOUNTER — Encounter: Payer: Self-pay | Admitting: Family Medicine

## 2018-12-09 ENCOUNTER — Telehealth: Payer: Self-pay | Admitting: Family Medicine

## 2018-12-09 DIAGNOSIS — Z0000 Encounter for general adult medical examination without abnormal findings: Secondary | ICD-10-CM

## 2018-12-09 DIAGNOSIS — E782 Mixed hyperlipidemia: Secondary | ICD-10-CM

## 2018-12-09 NOTE — Telephone Encounter (Signed)
Pt was RS for appt 9/25 in the afternoon for cpe. Pt would like to have labs prior to visit. Please advise.

## 2018-12-18 NOTE — Telephone Encounter (Signed)
Patient can have labs done the week of the 14th due to being short staffed next week. Please schedule lab appt. Labs placed

## 2018-12-29 ENCOUNTER — Other Ambulatory Visit (INDEPENDENT_AMBULATORY_CARE_PROVIDER_SITE_OTHER): Payer: 59

## 2018-12-29 ENCOUNTER — Other Ambulatory Visit: Payer: Self-pay

## 2018-12-29 DIAGNOSIS — E782 Mixed hyperlipidemia: Secondary | ICD-10-CM

## 2018-12-29 DIAGNOSIS — Z0000 Encounter for general adult medical examination without abnormal findings: Secondary | ICD-10-CM

## 2018-12-29 LAB — CBC
HCT: 40 % (ref 36.0–46.0)
Hemoglobin: 13.2 g/dL (ref 12.0–15.0)
MCHC: 33.1 g/dL (ref 30.0–36.0)
MCV: 88.7 fl (ref 78.0–100.0)
Platelets: 259 10*3/uL (ref 150.0–400.0)
RBC: 4.5 Mil/uL (ref 3.87–5.11)
RDW: 13.3 % (ref 11.5–15.5)
WBC: 5.5 10*3/uL (ref 4.0–10.5)

## 2018-12-29 LAB — COMPREHENSIVE METABOLIC PANEL
ALT: 13 U/L (ref 0–35)
AST: 15 U/L (ref 0–37)
Albumin: 4.4 g/dL (ref 3.5–5.2)
Alkaline Phosphatase: 76 U/L (ref 39–117)
BUN: 14 mg/dL (ref 6–23)
CO2: 28 mEq/L (ref 19–32)
Calcium: 9.5 mg/dL (ref 8.4–10.5)
Chloride: 104 mEq/L (ref 96–112)
Creatinine, Ser: 0.75 mg/dL (ref 0.40–1.20)
GFR: 78.26 mL/min (ref >60.00)
Glucose, Bld: 82 mg/dL (ref 70–99)
Potassium: 4.2 mEq/L (ref 3.5–5.1)
Sodium: 140 mEq/L (ref 135–145)
Total Bilirubin: 0.6 mg/dL (ref 0.2–1.2)
Total Protein: 6.4 g/dL (ref 6.0–8.3)

## 2018-12-29 LAB — LIPID PANEL
Cholesterol: 199 mg/dL (ref 0–200)
HDL: 70.8 mg/dL (ref >39.00)
LDL Cholesterol: 118 mg/dL — ABNORMAL HIGH (ref 0–99)
NonHDL: 128.67
Total CHOL/HDL Ratio: 3
Triglycerides: 51 mg/dL (ref 0.0–149.0)
VLDL: 10.2 mg/dL (ref 0.0–40.0)

## 2018-12-29 LAB — TSH: TSH: 2.01 u[IU]/mL (ref 0.35–4.50)

## 2019-01-08 ENCOUNTER — Encounter: Payer: 59 | Admitting: Family Medicine

## 2019-01-14 ENCOUNTER — Other Ambulatory Visit: Payer: Self-pay

## 2019-01-14 ENCOUNTER — Ambulatory Visit (INDEPENDENT_AMBULATORY_CARE_PROVIDER_SITE_OTHER): Payer: 59 | Admitting: Family Medicine

## 2019-01-14 ENCOUNTER — Encounter: Payer: Self-pay | Admitting: Family Medicine

## 2019-01-14 DIAGNOSIS — E782 Mixed hyperlipidemia: Secondary | ICD-10-CM

## 2019-01-14 DIAGNOSIS — Z0000 Encounter for general adult medical examination without abnormal findings: Secondary | ICD-10-CM | POA: Diagnosis not present

## 2019-01-14 DIAGNOSIS — M8580 Other specified disorders of bone density and structure, unspecified site: Secondary | ICD-10-CM

## 2019-01-14 NOTE — Assessment & Plan Note (Addendum)
Patient encouraged to maintain heart healthy diet, regular exercise, adequate sleep. Consider daily probiotics. Take medications as prescribed. Follows with GYN for paps, labs reviewed

## 2019-01-14 NOTE — Patient Instructions (Signed)
Multivitamin with minerals, selenium, zinc, Vitamin C and Vitamin D 1000 IU daily  Vitamin C 1000 daily and elderberrry  Pulse oximeter   Weekly vitals   Preventive Care 23-62 Years Old, Female Preventive care refers to visits with your health care provider and lifestyle choices that can promote health and wellness. This includes:  A yearly physical exam. This may also be called an annual well check.  Regular dental visits and eye exams.  Immunizations.  Screening for certain conditions.  Healthy lifestyle choices, such as eating a healthy diet, getting regular exercise, not using drugs or products that contain nicotine and tobacco, and limiting alcohol use. What can I expect for my preventive care visit? Physical exam Your health care provider will check your:  Height and weight. This may be used to calculate body mass index (BMI), which tells if you are at a healthy weight.  Heart rate and blood pressure.  Skin for abnormal spots. Counseling Your health care provider may ask you questions about your:  Alcohol, tobacco, and drug use.  Emotional well-being.  Home and relationship well-being.  Sexual activity.  Eating habits.  Work and work Statistician.  Method of birth control.  Menstrual cycle.  Pregnancy history. What immunizations do I need?  Influenza (flu) vaccine  This is recommended every year. Tetanus, diphtheria, and pertussis (Tdap) vaccine  You may need a Td booster every 10 years. Varicella (chickenpox) vaccine  You may need this if you have not been vaccinated. Zoster (shingles) vaccine  You may need this after age 12. Measles, mumps, and rubella (MMR) vaccine  You may need at least one dose of MMR if you were born in 1957 or later. You may also need a second dose. Pneumococcal conjugate (PCV13) vaccine  You may need this if you have certain conditions and were not previously vaccinated. Pneumococcal polysaccharide (PPSV23) vaccine   You may need one or two doses if you smoke cigarettes or if you have certain conditions. Meningococcal conjugate (MenACWY) vaccine  You may need this if you have certain conditions. Hepatitis A vaccine  You may need this if you have certain conditions or if you travel or work in places where you may be exposed to hepatitis A. Hepatitis B vaccine  You may need this if you have certain conditions or if you travel or work in places where you may be exposed to hepatitis B. Haemophilus influenzae type b (Hib) vaccine  You may need this if you have certain conditions. Human papillomavirus (HPV) vaccine  If recommended by your health care provider, you may need three doses over 6 months. You may receive vaccines as individual doses or as more than one vaccine together in one shot (combination vaccines). Talk with your health care provider about the risks and benefits of combination vaccines. What tests do I need? Blood tests  Lipid and cholesterol levels. These may be checked every 5 years, or more frequently if you are over 56 years old.  Hepatitis C test.  Hepatitis B test. Screening  Lung cancer screening. You may have this screening every year starting at age 30 if you have a 30-pack-year history of smoking and currently smoke or have quit within the past 15 years.  Colorectal cancer screening. All adults should have this screening starting at age 51 and continuing until age 21. Your health care provider may recommend screening at age 31 if you are at increased risk. You will have tests every 1-10 years, depending on your results and the  type of screening test.  Diabetes screening. This is done by checking your blood sugar (glucose) after you have not eaten for a while (fasting). You may have this done every 1-3 years.  Mammogram. This may be done every 1-2 years. Talk with your health care provider about when you should start having regular mammograms. This may depend on whether you  have a family history of breast cancer.  BRCA-related cancer screening. This may be done if you have a family history of breast, ovarian, tubal, or peritoneal cancers.  Pelvic exam and Pap test. This may be done every 3 years starting at age 39. Starting at age 9, this may be done every 5 years if you have a Pap test in combination with an HPV test. Other tests  Sexually transmitted disease (STD) testing.  Bone density scan. This is done to screen for osteoporosis. You may have this scan if you are at high risk for osteoporosis. Follow these instructions at home: Eating and drinking  Eat a diet that includes fresh fruits and vegetables, whole grains, lean protein, and low-fat dairy.  Take vitamin and mineral supplements as recommended by your health care provider.  Do not drink alcohol if: ? Your health care provider tells you not to drink. ? You are pregnant, may be pregnant, or are planning to become pregnant.  If you drink alcohol: ? Limit how much you have to 0-1 drink a day. ? Be aware of how much alcohol is in your drink. In the [place] one drink equals one 12 oz bottle of beer (355 mL), one 5 oz glass of wine (148 mL), or one 1 oz glass of hard liquor (44 mL). Lifestyle  Take daily care of your teeth and gums.  Stay active. Exercise for at least 30 minutes on 5 or more days each week.  Do not use any products that contain nicotine or tobacco, such as cigarettes, e-cigarettes, and chewing tobacco. If you need help quitting, ask your health care provider.  If you are sexually active, practice safe sex. Use a condom or other form of birth control (contraception) in order to prevent pregnancy and STIs (sexually transmitted infections).  If told by your health care provider, take low-dose aspirin daily starting at age 10. What's next?  Visit your health care provider once a year for a well check visit.  Ask your health care provider how often you should have your eyes and  teeth checked.  Stay up to date on all vaccines. This information is not intended to replace advice given to you by your health care provider. Make sure you discuss any questions you have with your health care provider. Document Released: 04/28/2015 Document Revised: 12/11/2017 Document Reviewed: 12/11/2017 Elsevier Patient Education  2020 [employer].

## 2019-01-16 NOTE — Progress Notes (Signed)
Subjective:    Patient ID: Ellen Morrow, female    DOB: Oct 19, 1955, 63 y.o.   MRN: [id number]  No chief complaint on file.   HPI Patient is in today for annual preventative exam. No recent febrile illness or hospitalizations. Is trying to stay active and maintain a heart healthy diet. She is maintaining quarantine when able. No acute concerns. Denies CP/palp/SOB/HA/congestion/fevers/GI or GU c/o. Taking meds as prescribed  Past Medical History:  Diagnosis Date  . Arthritis of right knee 02/07/2013  . Chicken pox as a child  . Headache 06/07/2014  . Hx: UTI (urinary tract infection) 02/07/2013  . Hyperlipidemia, mixed 08/21/2014  . Insomnia 08/15/2015  . Low back pain 11/17/2016  . Measles as a child  . Mumps as a child  . Osteoarthritis of both knees 02/07/2013  . Osteopenia 11/12/2016  . Preventative health care 02/07/2013  . 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 TMJ syndrome   . UTI (urinary tract infection)     Past Surgical History:  Procedure Laterality Date  . APPENDECTOMY  3 rd grade  . MOLE REMOVAL     benign- mole on right thigh  . scar tissue break up  2006   Dr Alfred Levins  . WISDOM TOOTH EXTRACTION  as a teenager    Family History  Problem Relation Age of Onset  . Osteoporosis Mother   . Diabetes Mother        type 2  . Cancer Mother 59       stomach  . Colon polyps Mother        benign  . Colon cancer Mother 13       Tumor in colon removed Stage 2  . Hypertension Father   . Hyperlipidemia Father   . Other Sister        herniated disk in back, low blood pressure  . Colon cancer Sister   . Hernia Brother        X 2  . ADD / ADHD Brother   . Hypertension Sister   . Depression Sister   . Cancer Maternal Grandmother        ? lung vs breast  . Cancer Maternal Aunt   . COPD Paternal 29   . Dementia Paternal Aunt   . Cancer Paternal Uncle     Social History   Socioeconomic History  . Marital status: Married    Spouse name: Not on file  .  Number of children: Not on file  . Years of education: Not on file  . Highest education level: Not on file  Occupational History  . Not on file  Social Needs  . Financial resource strain: Not on file  . Food insecurity    Worry: Not on file    Inability: Not on file  . Transportation needs    Medical: Not on file    Non-medical: Not on file  Tobacco Use  . Smoking status: Never Smoker  . Smokeless tobacco: Never Used  Substance and Sexual Activity  . Alcohol use: Yes    Alcohol/week: 1.0 standard drinks    Types: 1 Standard drinks or equivalent per week    Comment: occasional wine 2-3/month  . Drug use: No  . Sexual activity: Yes    Partners: Male    Birth control/protection: Post-menopausal    Comment: lives with husband, exercising regularly, no dietary restrictions  Lifestyle  . Physical activity    Days per week: Not on file    Minutes  per session: Not on file  . Stress: Not on file  Relationships  . Social Herbalist on phone: Not on file    Gets together: Not on file    Attends religious service: Not on file    Active member of club or organization: Not on file    Attends meetings of clubs or organizations: Not on file    Relationship status: Not on file  . Intimate partner violence    Fear of current or ex partner: Not on file    Emotionally abused: Not on file    Physically abused: Not on file    Forced sexual activity: Not on file  Other Topics Concern  . Not on file  Social History Narrative  . Not on file    Outpatient Medications Prior to Visit  Medication Sig Dispense Refill  . Ascorbic Acid (VITAMIN C) 1000 MG tablet Take 1,000 mg by mouth daily.    . Calcium Carbonate-Vitamin D (CALCIUM 600+D) 600-400 MG-UNIT tablet Take 1 tablet by mouth daily.    . cyclobenzaprine (FLEXERIL) 10 MG tablet Taking 1/2 a day  0  . estradiol (ESTRACE VAGINAL) 0.1 MG/GM vaginal cream Place 1/2 gram per vagina at hs two to three times per week. 42.5 g 1  .  KRILL OIL PO Take by mouth.    Marland Kitchen LIDODERM 5 % Place 1 patch onto the skin every 12 (twelve) hours as needed. Pt applies as needed 30 patch 2  . Melatonin 5 MG TABS Take 1 tablet by mouth at bedtime as needed.     . Omega-3 Fatty Acids (FISH OIL) 1200 MG CAPS Take by mouth.     No facility-administered medications prior to visit.     Allergies  Allergen Reactions  . Penicillins Rash    Review of Systems  Constitutional: Negative for chills, fever and malaise/fatigue.  HENT: Negative for congestion and hearing loss.   Eyes: Negative for discharge.  Respiratory: Negative for cough, sputum production and shortness of breath.   Cardiovascular: Negative for chest pain, palpitations and leg swelling.  Gastrointestinal: Negative for abdominal pain, blood in stool, constipation, diarrhea, heartburn, nausea and vomiting.  Genitourinary: Negative for dysuria, frequency, hematuria and urgency.  Musculoskeletal: Negative for back pain, falls and myalgias.  Skin: Negative for rash.  Neurological: Negative for dizziness, sensory change, loss of consciousness, weakness and headaches.  Endo/Heme/Allergies: Negative for environmental allergies. Does not bruise/bleed easily.  Psychiatric/Behavioral: Negative for depression and suicidal ideas. The patient is not nervous/anxious and does not have insomnia.        Objective:    Physical Exam Constitutional:      General: She is not in acute distress.    Appearance: She is well-developed.  HENT:     Head: Normocephalic and atraumatic.  Eyes:     Conjunctiva/sclera: Conjunctivae normal.  Neck:     Musculoskeletal: Neck supple.     Thyroid: No thyromegaly.  Cardiovascular:     Rate and Rhythm: Normal rate and regular rhythm.     Heart sounds: Normal heart sounds. No murmur.  Pulmonary:     Effort: Pulmonary effort is normal. No respiratory distress.     Breath sounds: Normal breath sounds.  Abdominal:     General: Bowel sounds are normal.  There is no distension.     Palpations: Abdomen is soft. There is no mass.     Tenderness: There is no abdominal tenderness.  Lymphadenopathy:     Cervical: No  cervical adenopathy.  Skin:    General: Skin is warm and dry.  Neurological:     Mental Status: She is alert and oriented to person, place, and time.  Psychiatric:        Behavior: Behavior normal.     BP 100/60 (BP Location: Left Arm, Patient Position: Sitting, Cuff Size: Normal)   Pulse 69   Temp 97.9 F (36.6 C) (Temporal)   Resp 18   Wt 142 lb 9.6 oz (64.7 kg)   LMP 04/15/2008   SpO2 98%   BMI 23.37 kg/m  Wt Readings from Last 3 Encounters:  01/14/19 142 lb 9.6 oz (64.7 kg)  06/17/18 146 lb (66.2 kg)  01/05/18 151 lb 12.8 oz (68.9 kg)    Diabetic Foot Exam - Simple   No data filed     Lab Results  Component Value Date   WBC 5.5 12/29/2018   HGB 13.2 12/29/2018   HCT 40.0 12/29/2018   PLT 259.0 12/29/2018   GLUCOSE 82 12/29/2018   CHOL 199 12/29/2018   TRIG 51.0 12/29/2018   HDL 70.80 12/29/2018   LDLDIRECT 98.0 01/05/2018   LDLCALC 118 (H) 12/29/2018   ALT 13 12/29/2018   AST 15 12/29/2018   NA 140 12/29/2018   K 4.2 12/29/2018   CL 104 12/29/2018   CREATININE 0.75 12/29/2018   BUN 14 12/29/2018   CO2 28 12/29/2018   TSH 2.01 12/29/2018    Lab Results  Component Value Date   TSH 2.01 12/29/2018   Lab Results  Component Value Date   WBC 5.5 12/29/2018   HGB 13.2 12/29/2018   HCT 40.0 12/29/2018   MCV 88.7 12/29/2018   PLT 259.0 12/29/2018   Lab Results  Component Value Date   NA 140 12/29/2018   K 4.2 12/29/2018   CO2 28 12/29/2018   GLUCOSE 82 12/29/2018   BUN 14 12/29/2018   CREATININE 0.75 12/29/2018   BILITOT 0.6 12/29/2018   ALKPHOS 76 12/29/2018   AST 15 12/29/2018   ALT 13 12/29/2018   PROT 6.4 12/29/2018   ALBUMIN 4.4 12/29/2018   CALCIUM 9.5 12/29/2018   GFR 78.26 12/29/2018   Lab Results  Component Value Date   CHOL 199 12/29/2018   Lab Results  Component  Value Date   HDL 70.80 12/29/2018   Lab Results  Component Value Date   LDLCALC 118 (H) 12/29/2018   Lab Results  Component Value Date   TRIG 51.0 12/29/2018   Lab Results  Component Value Date   CHOLHDL 3 12/29/2018   No results found for: HGBA1C     Assessment & Plan:   Problem List Items Addressed This Visit    Preventative health care    Patient encouraged to maintain heart healthy diet, regular exercise, adequate sleep. Consider daily probiotics. Take medications as prescribed. Follows with GYN for paps, labs reviewed      Hyperlipidemia, mixed    Encouraged heart healthy diet, increase exercise, avoid trans fats, consider a krill oil cap daily      Osteopenia    Encouraged to get adequate exercise, calcium and vitamin d intake         I am having Lezlie Octave. [place] maintain her KRILL OIL PO, Calcium Carbonate-Vitamin D, Melatonin, Lidoderm, cyclobenzaprine, vitamin C, Fish Oil, and estradiol.  No orders of the defined types were placed in this encounter.    Penni Homans, MD

## 2019-01-16 NOTE — Assessment & Plan Note (Signed)
Encouraged to get adequate exercise, calcium and vitamin d intake 

## 2019-01-16 NOTE — Assessment & Plan Note (Signed)
Encouraged heart healthy diet, increase exercise, avoid trans fats, consider a krill oil cap daily 

## 2019-03-16 DIAGNOSIS — U071 COVID-19: Secondary | ICD-10-CM

## 2019-03-16 HISTORY — DX: COVID-19: U07.1

## 2019-03-29 ENCOUNTER — Other Ambulatory Visit: Payer: Self-pay

## 2019-03-29 ENCOUNTER — Ambulatory Visit (INDEPENDENT_AMBULATORY_CARE_PROVIDER_SITE_OTHER): Payer: 59 | Admitting: Family Medicine

## 2019-03-29 ENCOUNTER — Encounter: Payer: Self-pay | Admitting: Family Medicine

## 2019-03-29 ENCOUNTER — Telehealth: Payer: Self-pay | Admitting: *Deleted

## 2019-03-29 DIAGNOSIS — U071 COVID-19: Secondary | ICD-10-CM | POA: Diagnosis not present

## 2019-03-29 NOTE — Telephone Encounter (Signed)
Patient stated that she feels much better than she did when she called.  She still is a little congested and is taking zinc, vitamin c, and has taken mucinex once.  He kids are suppose to come over christmas eve and wants to know when she should get tested again.  Patient scheduled for virtual visit for today with Dr. Nani Ravens.

## 2019-03-29 NOTE — Telephone Encounter (Signed)
Who Is Calling Patient / Member / Family / Caregiver Call Type Triage / Clinical Relationship To Patient Self Return Phone Number [phone] (Secondary) Chief Complaint Cough Reason for Call Symptomatic / Request for Health Information Initial Comment Caller states she tested positive for COVID. She was tested on Wednesday. States nose hurts/ sore/ tender like a sinus infection, cough, and nasal discharge. Translation No Nurse Assessment Nurse: Joya Gaskins, RN, Vonna Kotyk Date/Time (Eastern Time): 03/27/2019 8:57:51 AM Confirm and document reason for call. If symptomatic, describe symptoms. ---Caller states that she tested positive for COVID. She was tested on Wednesday. Her nose hurts, is sore and tender like a sinus infection; has cough, and nasal discharge.

## 2019-03-29 NOTE — Progress Notes (Signed)
Chief Complaint  Patient presents with  . questions about retesting for covid    Ellen Morrow here for URI complaints. Due to COVID-19 pandemic, we are interacting via web portal for an electronic face-to-face visit. I verified patient's ID using 2 identifiers. Patient agreed to proceed with visit via this method. Patient is at home, I am at office. Patient and I are present for visit.   Duration: 5 days  Associated symptoms: initially w subj fever, sinus congestion, sinus pain, rhinorrhea and cough Denies: itchy watery eyes, ear pain, ear drainage, sore throat, wheezing, shortness of breath, myalgia and N/V/D Treatment to date: Mucinex Sick contacts: Yes; mom in nursing home, visited just over a week ago and was told there was an outbreak.   ROS:  Const: Denies current fevers HEENT: As noted in HPI Lungs: No SOB  Past Medical History:  Diagnosis Date  . Arthritis of right knee 02/07/2013  . Chicken pox as a child  . Headache 06/07/2014  . Hx: UTI (urinary tract infection) 02/07/2013  . Hyperlipidemia, mixed 08/21/2014  . Insomnia 08/15/2015  . Low back pain 11/17/2016  . Measles as a child  . Mumps as a child  . Osteoarthritis of both knees 02/07/2013  . Osteopenia 11/12/2016  . Preventative health care 02/07/2013  . 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 TMJ syndrome   . UTI (urinary tract infection)    Exam No conversational dyspnea Age appropriate judgment and insight Nml affect and mood  COVID-19  Supportive care. Counseled on return from quarantine. Wear masks, social distance, push fluids. OK to cont Mucinex.  F/u prn.  Pt voiced understanding and agreement to the plan.  [place], DO 03/29/19 12:12 PM

## 2019-04-02 ENCOUNTER — Ambulatory Visit (INDEPENDENT_AMBULATORY_CARE_PROVIDER_SITE_OTHER): Payer: 59 | Admitting: Family Medicine

## 2019-04-02 ENCOUNTER — Encounter: Payer: Self-pay | Admitting: Family Medicine

## 2019-04-02 DIAGNOSIS — U071 COVID-19: Secondary | ICD-10-CM

## 2019-04-02 MED ORDER — PREDNISONE 20 MG PO TABS: 40.0000 mg | ORAL_TABLET | Freq: Every day | ORAL | 0 refills | Status: AC

## 2019-04-02 MED ORDER — ONDANSETRON 4 MG PO TBDP: 4.0000 mg | ORAL_TABLET | Freq: Three times a day (TID) | ORAL | 0 refills | Status: DC | PRN

## 2019-04-02 NOTE — Progress Notes (Signed)
Chief Complaint  Patient presents with  . Fever    feels better today  . Nausea  . Sinus Problem    Ellen Morrow here for Covid f/u. Due to COVID-19 pandemic, we are interacting via web portal for an electronic face-to-face visit. I verified patient's ID using 2 identifiers. Patient agreed to proceed with visit via this method. Patient is at home, I am at office. Patient and I are present for visit.   Dx'd w covid. Ss's started 1 week ago. Seen 4 d ago, was doing better until yesterday where she had return of fever in low 100's and sinus pain. Feels better today. Some nausea is lingering. No vomiting or diarrhea, no abd pain.   ROS:  Const: Denies current fevers HEENT: As noted in HPI Lungs: No SOB  Past Medical History:  Diagnosis Date  . Arthritis of right knee 02/07/2013  . Chicken pox as a child  . Headache 06/07/2014  . Hx: UTI (urinary tract infection) 02/07/2013  . Hyperlipidemia, mixed 08/21/2014  . Insomnia 08/15/2015  . Low back pain 11/17/2016  . Measles as a child  . Mumps as a child  . Osteoarthritis of both knees 02/07/2013  . Osteopenia 11/12/2016  . Preventative health care 02/07/2013  . 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 TMJ syndrome   . UTI (urinary tract infection)    Exam No conversational dyspnea Age appropriate judgment and insight Nml affect and mood  COVID-19 - Plan: ondansetron (ZOFRAN-ODT) 4 MG disintegrating tablet, predniSONE (DELTASONE) 20 MG tablet  Zofran prn nausea. Pred burst for myalgias and sinus pain. Tylenol.  Continue to push fluids, practice good hand hygiene, cover mouth when coughing. F/u prn. If starting to experience fevers, shaking, or shortness of breath, seek immediate care. Pt voiced understanding and agreement to the plan.  [place], DO 04/02/19 10:56 AM

## 2019-04-16 HISTORY — PX: DENTAL SURGERY: SHX609

## 2019-04-26 ENCOUNTER — Telehealth: Payer: Self-pay | Admitting: Family Medicine

## 2019-04-26 NOTE — Telephone Encounter (Signed)
Copied from [place] [id number]. Topic: General - Inquiry >> Apr 26, 2019  4:45 PM Mathis Bud wrote: Reason for CRM: Patient is requesting a note that she is clear of covid so she can visit family in nursing.  Patient is requesting to be in [place]. Call back [phone]

## 2019-04-29 NOTE — Telephone Encounter (Signed)
Called patient left message for patient to call the office back. Sent patient a my chart message as well  Nurse triage may handle

## 2019-04-30 NOTE — Progress Notes (Signed)
[facility] Note  05/10/2019     CHIEF COMPLAINT Patient presents for Retina Evaluation   HISTORY OF PRESENT ILLNESS: Ellen Morrow is a 64 y.o. female who presents to the clinic today for:   HPI    Retina Evaluation    In both eyes.  Onset: Unknown.  Duration of 1 month.  Associated Symptoms Negative for Flashes, Distortion, Pain, Photophobia, Trauma, Jaw Claudication, Fever, Fatigue, Floaters, Blind Spot, Redness, Glare, Scalp Tenderness, Shoulder/Hip pain and Weight Loss.  Context:  distance vision, mid-range vision and near vision.  Treatments tried include no treatments.  I, the attending physician,  performed the HPI with the patient and updated documentation appropriately.          Comments    64 y/o female pt referred by Dr. [place] for eval of drusen OU.  Saw Dr. [place] in 03/2019.  [facility] good OU cc.  Denies pain, flashes, floaters.  Visine prn OU.  Pt was at Dr. Coral Else office for annual eye exam; had no idea anything was wrong inside her eyes.       Last edited by Jobe Marker, COT on 05/10/2019  2:23 PM. (History)    Patient states referred by Dr. Thom Chimes for spots seen on retina on color photos in his office. Patient states vision is good OU.  Referring physician: Ok Edwards, OD [address] [place], [place] [address]  HISTORICAL INFORMATION:   Selected notes from the MEDICAL RECORD NUMBER Referred by Dr. Thom Chimes for concern of Drusen OU   CURRENT MEDICATIONS: No current outpatient medications on file. (Ophthalmic Drugs)   No current facility-administered medications for this visit. (Ophthalmic Drugs)   Current Outpatient Medications (Other)  Medication Sig  . albuterol (VENTOLIN HFA) 108 (90 Base) MCG/ACT inhaler SMARTSIG:1 Puff(s) Via Inhaler Every 4 Hours PRN  . Ascorbic Acid (VITAMIN C) 1000 MG tablet Take 1,000 mg by mouth daily.  . Calcium Carbonate-Vitamin D (CALCIUM 600+D) 600-400 MG-UNIT tablet Take 1 tablet by  mouth daily.  . cyclobenzaprine (FLEXERIL) 10 MG tablet Taking 1/2 a day  . estradiol (ESTRACE VAGINAL) 0.1 MG/GM vaginal cream Place 1/2 gram per vagina at hs two to three times per week.  Marland Kitchen KRILL OIL PO Take by mouth.  Marland Kitchen LIDODERM 5 % Place 1 patch onto the skin every 12 (twelve) hours as needed. Pt applies as needed  . Melatonin 5 MG TABS Take 1 tablet by mouth at bedtime as needed.   . Omega-3 Fatty Acids (FISH OIL) 1200 MG CAPS Take by mouth.  . ondansetron (ZOFRAN-ODT) 4 MG disintegrating tablet Take 1 tablet (4 mg total) by mouth every 8 (eight) hours as needed for nausea or vomiting.   No current facility-administered medications for this visit. (Other)      REVIEW OF SYSTEMS: ROS    Positive for: Musculoskeletal, Eyes   Negative for: Constitutional, Gastrointestinal, Neurological, Skin, Genitourinary, HENT, Endocrine, Cardiovascular, Respiratory, Psychiatric, Allergic/Imm, Heme/Lymph   Last edited by Matthew Folks, COA on 05/10/2019  1:38 PM. (History)       ALLERGIES Allergies  Allergen Reactions  . Penicillins Rash    PAST MEDICAL HISTORY Past Medical History:  Diagnosis Date  . Arthritis of right knee 02/07/2013  . Chicken pox as a child  . Headache 06/07/2014  . Hx: UTI (urinary tract infection) 02/07/2013  . Hyperlipidemia, mixed 08/21/2014  . Insomnia 08/15/2015  . Low back pain 11/17/2016  . Measles as a child  .  Mumps as a child  . Osteoarthritis of both knees 02/07/2013  . Osteopenia 11/12/2016  . Preventative health care 02/07/2013  . 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 TMJ syndrome   . UTI (urinary tract infection)    Past Surgical History:  Procedure Laterality Date  . APPENDECTOMY  3 rd grade  . MOLE REMOVAL     benign- mole on right thigh  . scar tissue break up  2006   Dr Alfred Levins  . WISDOM TOOTH EXTRACTION  as a teenager    FAMILY HISTORY Family History  Problem Relation Age of Onset  . Osteoporosis Mother   . Diabetes Mother         type 2  . Cancer Mother 88       stomach  . Colon polyps Mother        benign  . Colon cancer Mother 80       Tumor in colon removed Stage 2  . Glaucoma Mother   . Macular degeneration Mother   . Hypertension Father   . Hyperlipidemia Father   . Other Sister        herniated disk in back, low blood pressure  . Colon cancer Sister   . Hernia Brother        X 2  . ADD / ADHD Brother   . Hypertension Sister   . Depression Sister   . Cancer Maternal Grandmother        ? lung vs breast  . Cancer Maternal Aunt   . COPD Paternal 80   . Dementia Paternal Aunt   . Cancer Paternal Uncle     SOCIAL HISTORY Social History   Tobacco Use  . Smoking status: Never Smoker  . Smokeless tobacco: Never Used  Substance Use Topics  . Alcohol use: Yes    Alcohol/week: 1.0 standard drinks    Types: 1 Standard drinks or equivalent per week    Comment: occasional wine 2-3/month  . Drug use: No         OPHTHALMIC EXAM:  Base Eye Exam    Visual Acuity (Snellen - Linear)      Right Left   Dist cc 20/20 - 20/20 -   Correction: Glasses       Tonometry (Tonopen, 1:39 PM)      Right Left   Pressure 15 15       Pupils      Dark Light Shape React APD   Right 4 3 Round Brisk None   Left 4 3 Round Brisk None       Visual Fields (Counting fingers)      Left Right    Full Full       Extraocular Movement      Right Left    Full, Ortho Full, Ortho       Neuro/Psych    Oriented x3: Yes   Mood/Affect: Normal       Dilation    Both eyes: 1.0% Mydriacyl, 2.5% Phenylephrine @ 1:39 PM        Slit Lamp and Fundus Exam    Slit Lamp Exam      Right Left   Lids/Lashes Dermatochalasis - upper lid Dermatochalasis - upper lid   Conjunctiva/Sclera mild temporal pinguecula mild temporal pinguecula   Cornea 1+  Punctate epithelial erosions 1+  Punctate epithelial erosions   Anterior Chamber deep, narrow temporal angle deep, narrow temporal angle   Iris round and dilated round and  dilated   Lens  2+ NS, 2+cortical cataract 2+ NS, 2+cortical cataract   Vitreous syneresis syneresis       Fundus Exam      Right Left   Disc pink and sharp, mild inferior PPP pink and sharp   C/D Ratio 0.5 0.3   Macula Flat, good foveal reflex, mild RPE mottling, no heme or edema Flat, good foveal reflex, mild RPE mottling and clumping, no heme or edema   Vessels mild attenuation, mild A/V crossing changes mild attenuation, mild A/V crossing changes   Periphery attached, mid-zonal drusen attached, mid-zonal drusen, focal pigmented chorioretinal scar at 5:00        Refraction    Wearing Rx      Sphere Cylinder Axis Add   Right +1.50 +0.25 178 +2.25   Left +1.00 +1.00 168 +2.25   Age: 75yr   Type: PAL       Manifest Refraction      Sphere Cylinder Axis Dist VA   Right +1.25 Sphere  20/20-   Left +1.00 +0.50 170 20/20-          IMAGING AND PROCEDURES  Imaging and Procedures for @TODAY @  OCT, Retina - OU - Both Eyes       Right Eye Quality was good. Central Foveal Thickness: 231. Progression has no prior data. Findings include normal foveal contour, no IRF, no SRF, vitreomacular adhesion  (Rare drusen).   Left Eye Quality was good. Central Foveal Thickness: 251. Progression has no prior data. Findings include normal observations, normal foveal contour, no IRF, no SRF, vitreomacular adhesion  (Rare drusen).   Notes *Images captured and stored on drive  Diagnosis / Impression:  NFP, no IRF/SRF OU, VMA OU Rare drusen OU   Clinical management:  See below  Abbreviations: NFP - Normal foveal profile. CME - cystoid macular edema. PED - pigment epithelial detachment. IRF - intraretinal fluid. SRF - subretinal fluid. EZ - ellipsoid zone. ERM - epiretinal membrane. ORA - outer retinal atrophy. ORT - outer retinal tubulation. SRHM - subretinal hyper-reflective material                 ASSESSMENT/PLAN:    ICD-10-CM   1. Retinal drusen of both eyes  H35.363   2.  Retinal edema  H35.81 OCT, Retina - OU - Both Eyes  3. Combined forms of age-related cataract of both eyes  H25.813     1. Retina Drusen OU  - midzonal peripheral drusen OU -- not visually significant  - BCVA 20/20 OU  - pt reports +family history of exudative ARMD in mother who is 37+ yrs old -- treated at [facility]  - discussed findings, prognosis  - monitor  - f/u in 4-6 months, sooner prn  2. No retinal edema on exam or OCT  3. Age related cataracts OU  - The symptoms of cataract, surgical options, and treatments and risks were discussed with patient.  - discussed diagnosis and progression  - not yet visually significant  - monitor for now   Ophthalmic Meds Ordered this visit:  No orders of the defined types were placed in this encounter.      Return 4-6 months, for DFE, OCT.  There are no Patient Instructions on file for this visit.   Explained the diagnoses, plan, and follow up with the patient and they expressed understanding.  Patient expressed understanding of the importance of proper follow up care.   This document serves as a record of services personally performed by Gardiner Sleeper, MD,  PhD. It was created on their behalf by Leeann Must, [place], a certified ophthalmic assistant. The creation of this record is the provider's dictation and/or activities during the visit.    Electronically signed by: Leeann Must, COA @TODAY @ 4:00 PM   Gardiner Sleeper, M.D., Ph.D. Diseases & Surgery of the Retina and [place]  I have reviewed the above documentation for accuracy and completeness, and I agree with the above. Gardiner Sleeper, M.D., Ph.D. 05/10/19 4:00 PM   Abbreviations: M myopia (nearsighted); A astigmatism; H hyperopia (farsighted); P presbyopia; Mrx spectacle prescription;  CTL contact lenses; OD right eye; OS left eye; OU both eyes  XT exotropia; ET esotropia; PEK punctate epithelial keratitis; PEE punctate epithelial erosions;  DES dry eye syndrome; MGD meibomian gland dysfunction; ATs artificial tears; PFAT's preservative free artificial tears; [place] nuclear sclerotic cataract; PSC posterior subcapsular cataract; ERM epi-retinal membrane; PVD posterior vitreous detachment; RD retinal detachment; DM diabetes mellitus; DR diabetic retinopathy; NPDR non-proliferative diabetic retinopathy; PDR proliferative diabetic retinopathy; CSME clinically significant macular edema; DME diabetic macular edema; dbh dot blot hemorrhages; CWS cotton wool spot; POAG primary open angle glaucoma; C/D cup-to-disc ratio; HVF humphrey visual field; GVF goldmann visual field; OCT optical coherence tomography; IOP intraocular pressure; BRVO Branch retinal vein occlusion; CRVO central retinal vein occlusion; CRAO central retinal artery occlusion; BRAO branch retinal artery occlusion; RT retinal tear; SB scleral buckle; PPV pars plana vitrectomy; VH Vitreous hemorrhage; PRP panretinal laser photocoagulation; IVK intravitreal kenalog; VMT vitreomacular traction; MH Macular hole;  NVD neovascularization of the disc; NVE neovascularization elsewhere; AREDS age related eye disease study; ARMD age related macular degeneration; POAG primary open angle glaucoma; EBMD epithelial/anterior basement membrane dystrophy; ACIOL anterior chamber intraocular lens; IOL intraocular lens; PCIOL posterior chamber intraocular lens; Phaco/IOL phacoemulsification with intraocular lens placement; [place] photorefractive keratectomy; LASIK laser assisted in situ keratomileusis; HTN hypertension; DM diabetes mellitus; COPD chronic obstructive pulmonary disease

## 2019-05-10 ENCOUNTER — Ambulatory Visit (INDEPENDENT_AMBULATORY_CARE_PROVIDER_SITE_OTHER): Payer: 59 | Admitting: Ophthalmology

## 2019-05-10 ENCOUNTER — Encounter (INDEPENDENT_AMBULATORY_CARE_PROVIDER_SITE_OTHER): Payer: Self-pay | Admitting: Ophthalmology

## 2019-05-10 DIAGNOSIS — H3581 Retinal edema: Secondary | ICD-10-CM

## 2019-05-10 DIAGNOSIS — H35363 Drusen (degenerative) of macula, bilateral: Secondary | ICD-10-CM | POA: Diagnosis not present

## 2019-05-10 DIAGNOSIS — H25813 Combined forms of age-related cataract, bilateral: Secondary | ICD-10-CM

## 2019-05-17 DIAGNOSIS — Z9289 Personal history of other medical treatment: Secondary | ICD-10-CM

## 2019-05-17 HISTORY — DX: Personal history of other medical treatment: Z92.89

## 2019-06-22 NOTE — Progress Notes (Signed)
64 y.o. G2P2 Married Caucasian female here for annual exam.   She is having some bladder leakage again with running again.   She is using a pad.  She did pelvic floor therapy previously, and this helped.  Denies pain with urination.   Wants refill of vaginal estrogen.   Had Covid 03-25-19.   Taking an abx, Clindamycin,  following dental surgery.   Patient's Mom passed last week. Her paternal aunt also passed away last week.   PCP: Penni Homans, MD    Patient's last menstrual period was 04/15/2008.           Sexually active: Yes.    The current method of family planning is post menopausal status.    Exercising: Yes.    running and walking Smoker:  no  Health Maintenance: Pap:  06-17-18 Neg:Neg HR HPV, 05-29-15 Neg:Neg HR HPV, 04-15-12 normal per patient History of abnormal Pap:  no MMG: 09-26-18 3D/Neg/density A/Birads1 Colonoscopy: 07/24/16 benign polyp removed; f/u 5 yrs BMD: 11-12-16 Result :Osteopenia TDaP:  06/2013 Gardasil:   no HIV: Donated blood in past Hep C: 06-07-16 Neg Screening Labs:   PCP.   reports that she has never smoked. She has never used smokeless tobacco. She reports current alcohol use of about 1.0 standard drinks of alcohol per week. She reports that she does not use drugs.  Past Medical History:  Diagnosis Date  . Arthritis of right knee 02/07/2013  . Chicken pox as a child  . COVID-19 virus infection 03/2019  . Headache 06/07/2014  . History of dental surgery 05/2019  . Hx: UTI (urinary tract infection) 02/07/2013  . Hyperlipidemia, mixed 08/21/2014  . Insomnia 08/15/2015  . Low back pain 11/17/2016  . Measles as a child  . Mumps as a child  . Osteoarthritis of both knees 02/07/2013  . Osteopenia 11/12/2016  . Preventative health care 02/07/2013  . 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 TMJ syndrome   . UTI (urinary tract infection)     Past Surgical History:  Procedure Laterality Date  . APPENDECTOMY  3 rd grade  . MOLE REMOVAL     benign- mole  on right thigh  . scar tissue break up  2006   Dr Alfred Levins  . WISDOM TOOTH EXTRACTION  as a teenager    Current Outpatient Medications  Medication Sig Dispense Refill  . Ascorbic Acid (VITAMIN C) 1000 MG tablet Take 1,000 mg by mouth daily.    . Calcium Carbonate-Vitamin D (CALCIUM 600+D) 600-400 MG-UNIT tablet Take 1 tablet by mouth daily.    . clindamycin (CLEOCIN) 150 MG capsule Take 150 mg by mouth 3 (three) times daily.    . cyclobenzaprine (FLEXERIL) 10 MG tablet Taking 1/2 a day  0  . estradiol (ESTRACE VAGINAL) 0.1 MG/GM vaginal cream Place 1/2 gram per vagina at hs two to three times per week. 42.5 g 1  . KRILL OIL PO Take by mouth.    Marland Kitchen LIDODERM 5 % Place 1 patch onto the skin every 12 (twelve) hours as needed. Pt applies as needed 30 patch 2  . Melatonin 5 MG TABS Take 1 tablet by mouth at bedtime as needed.     . Omega-3 Fatty Acids (FISH OIL) 1200 MG CAPS Take by mouth.     No current facility-administered medications for this visit.    Family History  Problem Relation Age of Onset  . Osteoporosis Mother   . Diabetes Mother        type 2  .  Cancer Mother 10       stomach  . Colon polyps Mother        benign  . Colon cancer Mother 55       Tumor in colon removed Stage 2  . Glaucoma Mother   . Macular degeneration Mother   . Hypertension Father   . Hyperlipidemia Father   . Other Sister        herniated disk in back, low blood pressure  . Colon cancer Sister   . Hernia Brother        X 2  . ADD / ADHD Brother   . Hypertension Sister   . Depression Sister   . Cancer Maternal Grandmother        ? lung vs breast  . Cancer Maternal Aunt   . COPD Paternal 56   . Dementia Paternal Aunt   . Cancer Paternal Uncle     Review of Systems  All other systems reviewed and are negative.   Exam:   BP 102/60   Pulse 76   Temp (!) 96.9 F (36.1 C) (Temporal)   Resp 18   Ht 5' 6.5" (1.689 m)   Wt 144 lb (65.3 kg)   LMP 04/15/2008   BMI 22.89 kg/m      General appearance: alert, cooperative and appears stated age Head: normocephalic, without obvious abnormality, atraumatic Neck: no adenopathy, supple, symmetrical, trachea midline and thyroid normal to inspection and palpation Lungs: clear to auscultation bilaterally Breasts: normal appearance, no masses or tenderness, No nipple retraction or dimpling, No nipple discharge or bleeding, No axillary adenopathy Heart: regular rate and rhythm Abdomen: soft, non-tender; no masses, no organomegaly Extremities: extremities normal, atraumatic, no cyanosis or edema Skin: skin color, texture, turgor normal. No rashes or lesions Lymph nodes: cervical, supraclavicular, and axillary nodes normal. Neurologic: grossly normal  Pelvic: External genitalia:  no lesions              No abnormal inguinal nodes palpated.              Urethra:  normal appearing urethra with no masses, tenderness or lesions              Bartholins and Skenes: normal                 Vagina: normal appearing vagina with normal color and discharge, no lesions              Cervix: no lesions              Pap taken: No. Bimanual Exam:  Uterus:  normal size, contour, position, consistency, mobility, non-tender              Adnexa: no mass, fullness, tenderness              Rectal exam: Yes.  .  Confirms.              Anus:  normal sphincter tone, no lesions  Chaperone was present for exam.  Assessment:   Well woman visit with normal exam. GSI recurrent. Pelvic pain.  Resolved.  Atrophy. FH cancers.  Osteopenia. PCP managing.  Bereavement.   Plan: Mammogram screening discussed. Self breast awareness reviewed. Pap and HR HPV as above. Guidelines for Calcium, Vitamin D, regular exercise program including cardiovascular and weight bearing exercise. She will do her pelvic floor therapy. I briefly discussed midurethral slings.  Refill of vaginal estrogen cream.  I discussed potential effect of breast cancer.  Support given  for loss of family members.  Follow up annually and prn.   After visit summary provided.

## 2019-06-23 ENCOUNTER — Ambulatory Visit: Payer: 59 | Admitting: Obstetrics and Gynecology

## 2019-06-23 ENCOUNTER — Encounter: Payer: Self-pay | Admitting: Obstetrics and Gynecology

## 2019-06-23 ENCOUNTER — Other Ambulatory Visit: Payer: Self-pay

## 2019-06-23 VITALS — BP 102/60 | HR 76 | Temp 96.9°F | Resp 18 | Ht 66.5 in | Wt 144.0 lb

## 2019-06-23 DIAGNOSIS — Z01419 Encounter for gynecological examination (general) (routine) without abnormal findings: Secondary | ICD-10-CM

## 2019-06-23 MED ORDER — ESTRADIOL 0.1 MG/GM VA CREA: TOPICAL_CREAM | VAGINAL | 1 refills | Status: DC

## 2019-06-23 NOTE — Patient Instructions (Signed)

## 2019-09-06 NOTE — Progress Notes (Signed)
[facility] Note  09/07/2019     CHIEF COMPLAINT Patient presents for Retina Follow Up   HISTORY OF PRESENT ILLNESS: Ellen Morrow is a 64 y.o. female who presents to the clinic today for:   HPI    Retina Follow Up    Patient presents with  Other.  In both eyes.  This started 4 months ago.  Severity is moderate.  I, the attending physician,  performed the HPI with the patient and updated documentation appropriately.          Comments    Patient here for 4 months retina follow up for DFE OU. Patient states vision doing good. Doing the same. No eye pain.       Last edited by Bernarda Caffey, MD on 09/07/2019 10:36 AM. (History)    Patient states vision doing good OU. No change in vision from previous visit.   Referring physician: Ok Edwards, OD [address], [address] [place], [place] [address]  HISTORICAL INFORMATION:   Selected notes from the MEDICAL RECORD NUMBER Referred by Dr. Thom Chimes for concern of Drusen OU   CURRENT MEDICATIONS: No current outpatient medications on file. (Ophthalmic Drugs)   No current facility-administered medications for this visit. (Ophthalmic Drugs)   Current Outpatient Medications (Other)  Medication Sig  . Ascorbic Acid (VITAMIN C) 1000 MG tablet Take 1,000 mg by mouth daily.  . Calcium Carbonate-Vitamin D (CALCIUM 600+D) 600-400 MG-UNIT tablet Take 1 tablet by mouth daily.  . clindamycin (CLEOCIN) 150 MG capsule Take 150 mg by mouth 3 (three) times daily.  . cyclobenzaprine (FLEXERIL) 10 MG tablet Taking 1/2 a day  . estradiol (ESTRACE VAGINAL) 0.1 MG/GM vaginal cream Place 1/2 gram per vagina at hs two to three times per week.  Marland Kitchen KRILL OIL PO Take by mouth.  Marland Kitchen LIDODERM 5 % Place 1 patch onto the skin every 12 (twelve) hours as needed. Pt applies as needed  . Melatonin 5 MG TABS Take 1 tablet by mouth at bedtime as needed.   . Omega-3 Fatty Acids (FISH OIL) 1200 MG CAPS Take by mouth.   No current  facility-administered medications for this visit. (Other)      REVIEW OF SYSTEMS: ROS    Positive for: Musculoskeletal, Eyes   Negative for: Constitutional, Gastrointestinal, Neurological, Skin, Genitourinary, HENT, Endocrine, Cardiovascular, Respiratory, Psychiatric, Allergic/Imm, Heme/Lymph   Last edited by Theodore Demark, COA on 09/07/2019  9:32 AM. (History)       ALLERGIES Allergies  Allergen Reactions  . Penicillins Rash    PAST MEDICAL HISTORY Past Medical History:  Diagnosis Date  . Arthritis of right knee 02/07/2013  . Chicken pox as a child  . COVID-19 virus infection 03/2019  . Headache 06/07/2014  . History of dental surgery 05/2019  . Hx: UTI (urinary tract infection) 02/07/2013  . Hyperlipidemia, mixed 08/21/2014  . Insomnia 08/15/2015  . Low back pain 11/17/2016  . Measles as a child  . Mumps as a child  . Osteoarthritis of both knees 02/07/2013  . Osteopenia 11/12/2016  . Preventative health care 02/07/2013  . 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 TMJ syndrome   . UTI (urinary tract infection)    Past Surgical History:  Procedure Laterality Date  . APPENDECTOMY  3 rd grade  . MOLE REMOVAL     benign- mole on right thigh  . scar tissue break up  2006   Dr Alfred Levins  . WISDOM TOOTH EXTRACTION  as a  teenager    FAMILY HISTORY Family History  Problem Relation Age of Onset  . Osteoporosis Mother   . Diabetes Mother        type 2  . Cancer Mother 39       stomach  . Colon polyps Mother        benign  . Colon cancer Mother 21       Tumor in colon removed Stage 2  . Glaucoma Mother   . Macular degeneration Mother   . Hypertension Father   . Hyperlipidemia Father   . Other Sister        herniated disk in back, low blood pressure  . Colon cancer Sister   . Hernia Brother        X 2  . ADD / ADHD Brother   . Hypertension Sister   . Depression Sister   . Cancer Maternal Grandmother        ? lung vs breast  . Cancer Maternal Aunt   . COPD  Paternal 5   . Dementia Paternal Aunt   . Cancer Paternal Uncle     SOCIAL HISTORY Social History   Tobacco Use  . Smoking status: Never Smoker  . Smokeless tobacco: Never Used  Substance Use Topics  . Alcohol use: Yes    Alcohol/week: 1.0 standard drinks    Types: 1 Standard drinks or equivalent per week    Comment: occasional wine 2-3/month  . Drug use: No         OPHTHALMIC EXAM:  Base Eye Exam    Visual Acuity (Snellen - Linear)      Right Left   Dist cc 20/20 20/20   Correction: Glasses       Tonometry (Tonopen, 9:29 AM)      Right Left   Pressure 14 12       Pupils      Dark Light Shape React APD   Right 4 3 Round Brisk None   Left 4 3 Round Brisk None       Visual Fields (Counting fingers)      Left Right    Full Full       Extraocular Movement      Right Left    Full, Ortho Full, Ortho       Neuro/Psych    Oriented x3: Yes   Mood/Affect: Normal       Dilation    Both eyes: 1.0% Mydriacyl, 2.5% Phenylephrine @ 9:29 AM        Slit Lamp and Fundus Exam    Slit Lamp Exam      Right Left   Lids/Lashes Dermatochalasis - upper lid Dermatochalasis - upper lid   Conjunctiva/Sclera mild temporal pinguecula mild temporal pinguecula   Cornea trace  Punctate epithelial erosions trace  Punctate epithelial erosions   Anterior Chamber deep, narrow temporal angle deep, narrow temporal angle   Iris round and dilated round and dilated   Lens 2+ NS, 2+cortical cataract 2+ NS, 2+cortical cataract   Vitreous syneresis syneresis       Fundus Exam      Right Left   Disc pink and sharp, mild inferior PPP pink and sharp   C/D Ratio 0.5 0.3   Macula Flat, good foveal reflex, mild RPE mottling, no heme or edema Flat, good foveal reflex, mild RPE mottling and clumping, no heme or edema   Vessels mild attenuation, mild A/V crossing changes mild attenuation, mild A/V crossing changes   Periphery  attached, mid-zonal drusen attached, mid-zonal drusen, focal  pigmented chorioretinal scar at 5:00, no RT or SRF        Refraction    Wearing Rx      Sphere Cylinder Axis Add   Right +1.50 +0.25 178 +2.25   Left +1.00 +1.00 168 +2.25   Type: PAL          IMAGING AND PROCEDURES  Imaging and Procedures for @TODAY @  OCT, Retina - OU - Both Eyes       Right Eye Quality was good. Central Foveal Thickness: 230. Progression has been stable. Findings include normal foveal contour, no IRF, no SRF, vitreomacular adhesion  (Rare drusen).   Left Eye Quality was good. Central Foveal Thickness: 233. Progression has been stable. Findings include normal observations, normal foveal contour, no IRF, no SRF, vitreomacular adhesion  (Rare drusen).   Notes *Images captured and stored on drive  Diagnosis / Impression:  NFP, no IRF/SRF OU, VMA OU Rare drusen OU  Clinical management:  See below  Abbreviations: NFP - Normal foveal profile. CME - cystoid macular edema. PED - pigment epithelial detachment. IRF - intraretinal fluid. SRF - subretinal fluid. EZ - ellipsoid zone. ERM - epiretinal membrane. ORA - outer retinal atrophy. ORT - outer retinal tubulation. SRHM - subretinal hyper-reflective material                 ASSESSMENT/PLAN:    ICD-10-CM   1. Retinal drusen of both eyes  H35.363   2. Retinal edema  H35.81 OCT, Retina - OU - Both Eyes  3. Combined forms of age-related cataract of both eyes  H25.813     1. Retina Drusen OU  - midzonal peripheral drusen OU -- not visually significant  - BCVA 20/20 OU  - pt reports +family history of exudative ARMD in mother who was 33+ yrs old -- treated at [facility]  - discussed findings, prognosis  - monitor  - f/u in 12 months  2. No retinal edema on exam or OCT  3. Age related cataracts OU  - The symptoms of cataract, surgical options, and treatments and risks were discussed with patient.  - discussed diagnosis and progression  - not yet visually significant  - monitor for now  **Pt looking  for new primary eye care provider as Dr. Thom Chimes is now retired**   Ophthalmic Meds Ordered this visit:  No orders of the defined types were placed in this encounter.      Return in about 1 year (around 09/06/2020) for DFE, OCT.  There are no Patient Instructions on file for this visit.   Explained the diagnoses, plan, and follow up with the patient and they expressed understanding.  Patient expressed understanding of the importance of proper follow up care.   This document serves as a record of services personally performed by Gardiner Sleeper, MD, PhD. It was created on their behalf by Roselee Nova, COMT. The creation of this record is the provider's dictation and/or activities during the visit.  Electronically signed by: Roselee Nova, COMT 09/07/19 1:20 PM  Gardiner Sleeper, M.D., Ph.D. Diseases & Surgery of the Retina and [place] 09/07/2019   I have reviewed the above documentation for accuracy and completeness, and I agree with the above. Gardiner Sleeper, M.D., Ph.D. 09/07/19 1:20 PM    Abbreviations: M myopia (nearsighted); A astigmatism; H hyperopia (farsighted); P presbyopia; Mrx spectacle prescription;  CTL contact lenses; OD right eye; OS left  eye; OU both eyes  XT exotropia; ET esotropia; PEK punctate epithelial keratitis; PEE punctate epithelial erosions; DES dry eye syndrome; MGD meibomian gland dysfunction; ATs artificial tears; PFAT's preservative free artificial tears; Luke nuclear sclerotic cataract; PSC posterior subcapsular cataract; ERM epi-retinal membrane; PVD posterior vitreous detachment; RD retinal detachment; DM diabetes mellitus; DR diabetic retinopathy; NPDR non-proliferative diabetic retinopathy; PDR proliferative diabetic retinopathy; CSME clinically significant macular edema; DME diabetic macular edema; dbh dot blot hemorrhages; CWS cotton wool spot; POAG primary open angle glaucoma; C/D cup-to-disc ratio; HVF humphrey visual  field; GVF goldmann visual field; OCT optical coherence tomography; IOP intraocular pressure; BRVO Branch retinal vein occlusion; CRVO central retinal vein occlusion; CRAO central retinal artery occlusion; BRAO branch retinal artery occlusion; RT retinal tear; SB scleral buckle; PPV pars plana vitrectomy; VH Vitreous hemorrhage; PRP panretinal laser photocoagulation; IVK intravitreal kenalog; VMT vitreomacular traction; MH Macular hole;  NVD neovascularization of the disc; NVE neovascularization elsewhere; AREDS age related eye disease study; ARMD age related macular degeneration; POAG primary open angle glaucoma; EBMD epithelial/anterior basement membrane dystrophy; ACIOL anterior chamber intraocular lens; IOL intraocular lens; PCIOL posterior chamber intraocular lens; Phaco/IOL phacoemulsification with intraocular lens placement; [place] photorefractive keratectomy; LASIK laser assisted in situ keratomileusis; HTN hypertension; DM diabetes mellitus; COPD chronic obstructive pulmonary disease

## 2019-09-07 ENCOUNTER — Other Ambulatory Visit: Payer: Self-pay

## 2019-09-07 ENCOUNTER — Encounter (INDEPENDENT_AMBULATORY_CARE_PROVIDER_SITE_OTHER): Payer: Self-pay | Admitting: Ophthalmology

## 2019-09-07 ENCOUNTER — Ambulatory Visit (INDEPENDENT_AMBULATORY_CARE_PROVIDER_SITE_OTHER): Payer: 59 | Admitting: Ophthalmology

## 2019-09-07 DIAGNOSIS — H25813 Combined forms of age-related cataract, bilateral: Secondary | ICD-10-CM | POA: Diagnosis not present

## 2019-09-07 DIAGNOSIS — H3581 Retinal edema: Secondary | ICD-10-CM | POA: Diagnosis not present

## 2019-09-07 DIAGNOSIS — H35363 Drusen (degenerative) of macula, bilateral: Secondary | ICD-10-CM

## 2019-09-27 LAB — HM MAMMOGRAPHY

## 2019-09-28 ENCOUNTER — Encounter: Payer: Self-pay | Admitting: *Deleted

## 2020-01-17 ENCOUNTER — Encounter: Payer: 59 | Admitting: Family Medicine

## 2020-02-01 ENCOUNTER — Ambulatory Visit (INDEPENDENT_AMBULATORY_CARE_PROVIDER_SITE_OTHER): Payer: No Typology Code available for payment source | Admitting: Family

## 2020-02-01 ENCOUNTER — Other Ambulatory Visit: Payer: Self-pay

## 2020-02-01 ENCOUNTER — Encounter: Payer: Self-pay | Admitting: Family

## 2020-02-01 VITALS — BP 116/76 | HR 70 | Temp 98.5°F | Resp 16 | Ht 66.0 in | Wt 143.0 lb

## 2020-02-01 DIAGNOSIS — N393 Stress incontinence (female) (male): Secondary | ICD-10-CM

## 2020-02-01 DIAGNOSIS — Z0000 Encounter for general adult medical examination without abnormal findings: Secondary | ICD-10-CM

## 2020-02-01 DIAGNOSIS — E1169 Type 2 diabetes mellitus with other specified complication: Secondary | ICD-10-CM

## 2020-02-01 DIAGNOSIS — M8580 Other specified disorders of bone density and structure, unspecified site: Secondary | ICD-10-CM

## 2020-02-01 NOTE — Progress Notes (Signed)
Subjective:    Patient ID: Ellen Morrow, female    DOB: January 21, 1956, 64 y.o.   MRN: [id number]  HPI  Patient presents today for complete physical.  Immunizations: flu shot and booster up to date Diet: reports diet is healthy Wt Readings from Last 3 Encounters:  02/01/20 143 lb (64.9 kg)  06/23/19 144 lb (65.3 kg)  01/14/19 142 lb 9.6 oz (64.7 kg)  Exercise: runs 2-3 times a week and does silver sneakers 2-3 days a week Colonoscopy: 2018- due 2023 Dexa: 2018 Pap Smear: 06/17/18 Mammogram: 09/27/19 Vision: up to date Dental: up to date  C/o bladder leakage with running.  Has done pelvic PT in the past with some improvement but symptoms have worsened recently. GYN suggested surgery- she is hoping to avoid.    Review of Systems  Constitutional: Negative for unexpected weight change.  HENT: Negative for hearing loss and rhinorrhea.   Eyes: Negative for visual disturbance.  Respiratory: Negative for cough and shortness of breath.   Cardiovascular: Negative for chest pain.  Gastrointestinal: Negative for constipation and diarrhea.  Genitourinary: Negative for dysuria, frequency and hematuria.  Musculoskeletal: Positive for arthralgias (some right hip pain- doing "integrative therapy). Negative for myalgias.  Skin: Negative for rash.  Neurological: Negative for headaches.  Hematological: Negative for adenopathy.  Psychiatric/Behavioral:       Denies depression/anxiety       Past Medical History:  Diagnosis Date  . Arthritis of right knee 02/07/2013  . Chicken pox as a child  . COVID-19 virus infection 03/2019  . Headache 06/07/2014  . History of dental surgery 05/2019  . Hx: UTI (urinary tract infection) 02/07/2013  . Hyperlipidemia, mixed 08/21/2014  . Insomnia 08/15/2015  . Low back pain 11/17/2016  . Measles as a child  . Mumps as a child  . Osteoarthritis of both knees 02/07/2013  . Osteopenia 11/12/2016  . Preventative health care 02/07/2013  . Sacroiliac dysfunction  11/12/2016  . TMJ syndrome   . UTI (urinary tract infection)      Social History   Socioeconomic History  . Marital status: Married    Spouse name: Not on file  . Number of children: Not on file  . Years of education: Not on file  . Highest education level: Not on file  Occupational History  . Not on file  Tobacco Use  . Smoking status: Never Smoker  . Smokeless tobacco: Never Used  Vaping Use  . Vaping Use: Never used  Substance and Sexual Activity  . Alcohol use: Yes    Alcohol/week: 1.0 standard drink    Types: 1 Standard drinks or equivalent per week    Comment: occasional wine 2-3/month  . Drug use: No  . Sexual activity: Yes    Partners: Male    Birth control/protection: Post-menopausal    Comment: lives with husband, exercising regularly, no dietary restrictions  Other Topics Concern  . Not on file  Social History Narrative  . Not on file   Social Determinants of Health   Financial Resource Strain:   . Difficulty of Paying Living Expenses: Not on file  Food Insecurity:   . Worried About Charity fundraiser in the Last Year: Not on file  . Ran Out of Food in the Last Year: Not on file  Transportation Needs:   . Lack of Transportation (Medical): Not on file  . Lack of Transportation (Non-Medical): Not on file  Physical Activity:   . Days of Exercise per Week: Not  on file  . Minutes of Exercise per Session: Not on file  Stress:   . Feeling of Stress : Not on file  Social Connections:   . Frequency of Communication with Friends and Family: Not on file  . Frequency of Social Gatherings with Friends and Family: Not on file  . Attends Religious Services: Not on file  . Active Member of Clubs or Organizations: Not on file  . Attends Archivist Meetings: Not on file  . Marital Status: Not on file  Intimate Partner Violence:   . Fear of Current or Ex-Partner: Not on file  . Emotionally Abused: Not on file  . Physically Abused: Not on file  . Sexually  Abused: Not on file    Past Surgical History:  Procedure Laterality Date  . APPENDECTOMY  3 rd grade  . MOLE REMOVAL     benign- mole on right thigh  . scar tissue break up  2006   Dr Alfred Levins  . WISDOM TOOTH EXTRACTION  as a teenager    Family History  Problem Relation Age of Onset  . Osteoporosis Mother   . Diabetes Mother        type 2  . Cancer Mother 92       stomach  . Colon polyps Mother        benign  . Colon cancer Mother 8       Tumor in colon removed Stage 2  . Glaucoma Mother   . Macular degeneration Mother   . Hypertension Father   . Hyperlipidemia Father   . Other Sister        herniated disk in back, low blood pressure  . Colon cancer Sister   . Hernia Brother        X 2  . ADD / ADHD Brother   . Hypertension Sister   . Depression Sister   . Cancer Maternal Grandmother        ? lung vs breast  . Cancer Maternal Aunt   . COPD Paternal 68   . Dementia Paternal Aunt   . Cancer Paternal Uncle     Allergies  Allergen Reactions  . Penicillins Rash    Current Outpatient Medications on File Prior to Visit  Medication Sig Dispense Refill  . Ascorbic Acid (VITAMIN C) 1000 MG tablet Take 1,000 mg by mouth daily.    . Calcium Carbonate-Vitamin D (CALCIUM 600+D) 600-400 MG-UNIT tablet Take 1 tablet by mouth daily.    . Cholecalciferol (VITAMIN D3) 125 MCG (5000 UT) CAPS Take by mouth.    . cyclobenzaprine (FLEXERIL) 10 MG tablet Taking 1/2 a day  0  . estradiol (ESTRACE VAGINAL) 0.1 MG/GM vaginal cream Place 1/2 gram per vagina at hs two to three times per week. 42.5 g 1  . LIDODERM 5 % Place 1 patch onto the skin every 12 (twelve) hours as needed. Pt applies as needed 30 patch 2  . Magnesium 100 MG CAPS Take by mouth.    . Melatonin 5 MG TABS Take 1 tablet by mouth at bedtime as needed.     . Multiple Vitamins-Minerals (PRESERVISION AREDS 2 PO) Take by mouth.    . Omega-3 Fatty Acids (FISH OIL) 1200 MG CAPS Take by mouth.     No current  facility-administered medications on file prior to visit.    BP 116/76 (BP Location: Right Arm, Patient Position: Sitting, Cuff Size: Small)   Pulse 70   Temp 98.5 F (36.9 C) (Oral)  Resp 16   Ht 5\' 6"  (1.676 m)   Wt 143 lb (64.9 kg)   LMP 04/15/2008   SpO2 98%   BMI 23.08 kg/m    Objective:   Physical Exam Physical Exam  Constitutional: She is oriented to person, place, and time. She appears well-developed and well-nourished. No distress.  HENT:  Head: Normocephalic and atraumatic.  Right Ear: Tympanic membrane and ear canal normal.  Left Ear: Tympanic membrane and ear canal normal.  Mouth/Throat: not examined-pt wearing mask Eyes: Pupils are equal, round, and reactive to light. No scleral icterus.  Neck: Normal range of motion. No thyromegaly present.  Cardiovascular: Normal rate and regular rhythm.   No murmur heard. Pulmonary/Chest: Effort normal and breath sounds normal. No respiratory distress. He has no wheezes. She has no rales. She exhibits no tenderness.  Abdominal: Soft. Bowel sounds are normal. She exhibits no distension and no mass. There is no tenderness. There is no rebound and no guarding.  Musculoskeletal: She exhibits no edema.  Lymphadenopathy:    She has no cervical adenopathy.  Neurological: She is alert and oriented to person, place, and time. She has normal patellar reflexes. She exhibits normal muscle tone. Coordination normal.  Skin: Skin is warm and dry.  Psychiatric: She has a normal mood and affect. Her behavior is normal. Judgment and thought content normal.  Breast/pelvic: deferred       Assessment & Plan:   Preventative care- encouraged pt to continue healthy diet, exercise.  Immunizations reviewed and up to date. Colo, pap, mammo up to date.  Due for follow up dexa scan.   She takes a vitamin D 5000iu supplement and magnesium supplement 100mg  bid. She is wondering if she needs to continue these supplements.  Will check levels at pt's  request.  Stress incontinence- she is hoping to avoid surgery. Suggested that try Revive Bladder Support.  This visit occurred during the SARS-CoV-2 public health emergency.  Safety protocols were in place, including screening questions prior to the visit, additional usage of staff PPE, and extensive cleaning of exam room while observing appropriate contact time as indicated for disinfecting solutions.          Assessment & Plan:

## 2020-02-01 NOTE — Patient Instructions (Signed)
Try Revive Bladder Support. Complete lab work prior to leaving. You should be contacted about scheduling your appointment for bone density. Continue healthy diet and regular exercise.

## 2020-02-04 LAB — VITAMIN D 1,25 DIHYDROXY
Vitamin D 1, 25 (OH)2 Total: 52 pg/mL (ref 18–72)
Vitamin D2 1, 25 (OH)2: <8 pg/mL
Vitamin D3 1, 25 (OH)2: 52 pg/mL

## 2020-02-04 LAB — COMPREHENSIVE METABOLIC PANEL
AG Ratio: 1.8 (calc) (ref 1.0–2.5)
ALT: 21 U/L (ref 6–29)
AST: 19 U/L (ref 10–35)
Albumin: 4.5 g/dL (ref 3.6–5.1)
Alkaline phosphatase (APISO): 96 U/L (ref 37–153)
BUN: 12 mg/dL (ref 7–25)
CO2: 30 mmol/L (ref 20–32)
Calcium: 10 mg/dL (ref 8.6–10.4)
Chloride: 102 mmol/L (ref 98–110)
Creat: 0.7 mg/dL (ref 0.50–0.99)
Globulin: 2.5 g/dL (calc) (ref 1.9–3.7)
Glucose, Bld: 90 mg/dL (ref 65–99)
Potassium: 4.6 mmol/L (ref 3.5–5.3)
Sodium: 139 mmol/L (ref 135–146)
Total Bilirubin: 0.5 mg/dL (ref 0.2–1.2)
Total Protein: 7 g/dL (ref 6.1–8.1)

## 2020-02-04 LAB — LIPID PANEL
Cholesterol: 234 mg/dL — ABNORMAL HIGH (ref <200)
HDL: 63 mg/dL (ref >=50)
LDL Cholesterol (Calc): 138 mg/dL (calc) — ABNORMAL HIGH
Non-HDL Cholesterol (Calc): 171 mg/dL (calc) — ABNORMAL HIGH (ref <130)
Total CHOL/HDL Ratio: 3.7 (calc) (ref <5.0)
Triglycerides: 190 mg/dL — ABNORMAL HIGH (ref <150)

## 2020-02-04 LAB — MAGNESIUM: Magnesium: 2.3 mg/dL (ref 1.5–2.5)

## 2020-02-13 ENCOUNTER — Encounter: Payer: Self-pay | Admitting: Family

## 2020-06-26 ENCOUNTER — Ambulatory Visit: Payer: 59 | Admitting: Obstetrics and Gynecology

## 2020-06-26 ENCOUNTER — Encounter: Payer: Self-pay | Admitting: Obstetrics and Gynecology

## 2020-06-26 ENCOUNTER — Other Ambulatory Visit: Payer: Self-pay

## 2020-06-26 VITALS — BP 122/74 | HR 68 | Ht 65.5 in | Wt 148.0 lb

## 2020-06-26 DIAGNOSIS — M8580 Other specified disorders of bone density and structure, unspecified site: Secondary | ICD-10-CM

## 2020-06-26 DIAGNOSIS — Z01419 Encounter for gynecological examination (general) (routine) without abnormal findings: Secondary | ICD-10-CM | POA: Diagnosis not present

## 2020-06-26 MED ORDER — ESTRADIOL 0.1 MG/GM VA CREA
TOPICAL_CREAM | VAGINAL | 1 refills | Status: DC
Start: 2020-06-26 — End: 2021-06-28

## 2020-06-26 NOTE — Progress Notes (Signed)
65 y.o. G2P2 Married Caucasian female here for annual exam.    Had Covid before vaccination was available.  Had Covid again after receiving her booster vaccine.   Modifying vitamin intake.   Having bladder leakage again and wearing pads.  Occurs with running.  She has done physical therapy.   Still using vaginal estrogen twice a week.   PCP: Penni Homans, MD    Patient's last menstrual period was 04/15/2008.           Sexually active: Yes.    The current method of family planning is post menopausal status.    Exercising: Yes.    Silver sneakers 2x/week, runs 2-3x/week Smoker:  no  Health Maintenance: Pap: 06-17-18 Neg:Neg HR HPV, 05-29-15 Neg:Neg HR HPV, 04-15-12 normal per patient History of abnormal Pap:  no MMG:  09-27-19 Neg/BiRads1 Colonoscopy: 07/24/16 benign polyp removed; f/u 5 yrs  BMD: 11-12-16  Result :Osteopenia of hip and spine.  TDaP:  06/2013 Gardasil:   no [id number] blood in past Hep C: 06-07-16 Neg Screening Labs:  PCP.    reports that she has never smoked. She has never used smokeless tobacco. She reports current alcohol use of about 1.0 standard drink of alcohol per week. She reports that she does not use drugs.  Past Medical History:  Diagnosis Date  . Arthritis of right knee 02/07/2013  . Chicken pox as a child  . COVID-19 virus infection 03/2019, 05/2020  . Headache 06/07/2014  . History of dental surgery 05/2019  . Hx: UTI (urinary tract infection) 02/07/2013  . Hyperlipidemia, mixed 08/21/2014  . Insomnia 08/15/2015  . Low back pain 11/17/2016  . Measles as a child  . Mumps as a child  . Osteoarthritis of both knees 02/07/2013  . Osteopenia 11/12/2016  . Preventative health care 02/07/2013  . 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 TMJ syndrome   . UTI (urinary tract infection)     Past Surgical History:  Procedure Laterality Date  . APPENDECTOMY  3 rd grade  . MOLE REMOVAL     benign- mole on right thigh  . scar tissue break up  2006   Dr Alfred Levins  . WISDOM TOOTH EXTRACTION  as a teenager    Current Outpatient Medications  Medication Sig Dispense Refill  . Ascorbic Acid (VITAMIN C) 1000 MG tablet Take 1,000 mg by mouth daily.    . Calcium Carbonate-Vitamin D 600-400 MG-UNIT tablet Take 1 tablet by mouth daily.    Marland Kitchen estradiol (ESTRACE VAGINAL) 0.1 MG/GM vaginal cream Place 1/2 gram per vagina at hs two to three times per week. 42.5 g 1  . LIDODERM 5 % Place 1 patch onto the skin every 12 (twelve) hours as needed. Pt applies as needed 30 patch 2  . Magnesium 100 MG CAPS Take by mouth.    . Multiple Vitamins-Minerals (PRESERVISION AREDS 2 PO) Take by mouth.     No current facility-administered medications for this visit.    Family History  Problem Relation Age of Onset  . Osteoporosis Mother   . Diabetes Mother        type 2  . Cancer Mother 6       stomach  . Colon polyps Mother        benign  . Colon cancer Mother 4       Tumor in colon removed Stage 2  . Glaucoma Mother   . Macular degeneration Mother   . Hypertension Father   . Hyperlipidemia Father   .  Other Sister        herniated disk in back, low blood pressure  . Colon cancer Sister   . Hernia Brother        X 2  . ADD / ADHD Brother   . Hypertension Sister   . Depression Sister   . Cancer Maternal Grandmother        ? lung vs breast  . Cancer Maternal Aunt   . COPD Paternal 33   . Dementia Paternal Aunt   . Cancer Paternal Uncle     Review of Systems  Genitourinary:       Urinary incontinence with running  All other systems reviewed and are negative.   Exam:   BP 122/74   Pulse 68   Ht 5' 5.5" (1.664 m)   Wt 148 lb (67.1 kg)   LMP 04/15/2008   SpO2 97%   BMI 24.25 kg/m     General appearance: alert, cooperative and appears stated age Head: normocephalic, without obvious abnormality, atraumatic Neck: no adenopathy, supple, symmetrical, trachea midline and thyroid normal to inspection and palpation Lungs: clear to auscultation  bilaterally Breasts: normal appearance, no masses or tenderness, No nipple retraction or dimpling, No nipple discharge or bleeding, No axillary adenopathy Heart: regular rate and rhythm Abdomen: soft, non-tender; no masses, no organomegaly Extremities: extremities normal, atraumatic, no cyanosis or edema Skin: skin color, texture, turgor normal. No rashes or lesions Lymph nodes: cervical, supraclavicular, and axillary nodes normal. Neurologic: grossly normal  Pelvic: External genitalia:  no lesions              No abnormal inguinal nodes palpated.              Urethra:  normal appearing urethra with no masses, tenderness or lesions              Bartholins and Skenes: normal                 Vagina: normal appearing vagina with normal color and discharge, no lesions              Cervix: no lesions              Pap taken: No. Bimanual Exam:  Uterus:  normal size, contour, position, consistency, mobility, non-tender              Adnexa: no mass, fullness, tenderness              Rectal exam: Yes.  .  Confirms.              Anus:  normal sphincter tone, no lesions  Chaperone was present for exam.  Assessment:   Well woman visit with normal exam. GSI recurrent. Atrophy. FH colon cancers.  Osteopenia.  Mildly elevated LDL cholesterol.   Plan: Mammogram screening discussed. Self breast awareness reviewed. Pap and HR HPV as above. Guidelines for Calcium, Vitamin D, regular exercise program including cardiovascular and weight bearing exercise. Impressa for stress incontinence.  Will start doing bone densities in this office.  She will schedule this today.  Refill of vaginal estrogen.  I discussed potential effect on breast cancer. She will resume her omega fatty acids.  Labs with PCP.   Follow up annually and prn.

## 2020-06-26 NOTE — Patient Instructions (Addendum)
EXERCISE AND DIET:  We recommended that you start or continue a regular exercise program for good health. Regular exercise means any activity that makes your heart beat faster and makes you sweat.  We recommend exercising at least 30 minutes per day at least 3 days a week, preferably 4 or 5.  We also recommend a diet low in fat and sugar.  Inactivity, poor dietary choices and obesity can cause diabetes, heart attack, stroke, and kidney damage, among others.    ALCOHOL AND SMOKING:  Women should limit their alcohol intake to no more than 7 drinks/beers/glasses of wine (combined, not each!) per week. Moderation of alcohol intake to this level decreases your risk of breast cancer and liver damage. And of course, no recreational drugs are part of a healthy lifestyle.  And absolutely no smoking or even second hand smoke. Most people know smoking can cause heart and lung diseases, but did you know it also contributes to weakening of your bones? Aging of your skin?  Yellowing of your teeth and nails?  CALCIUM AND VITAMIN D:  Adequate intake of calcium and Vitamin D are recommended.  The recommendations for exact amounts of these supplements seem to change often, but generally speaking a total 1200 mg of calcium (either carbonate or citrate) in divided doses and 600 - 800 units of Vitamin D per day seems prudent. Certain women may benefit from higher intake of Vitamin D.  If you are among these women, your doctor will have told you during your visit.    PAP SMEARS:  Pap smears, to check for cervical cancer or precancers,  have traditionally been done yearly, although recent scientific advances have shown that most women can have pap smears less often.  However, every woman still should have a physical exam from her gynecologist every year. It will include a breast check, inspection of the vulva and vagina to check for abnormal growths or skin changes, a visual exam of the cervix, and then an exam to evaluate the size  and shape of the uterus and ovaries.  And after 64 years of age, a rectal exam is indicated to check for rectal cancers. We will also provide age appropriate advice regarding health maintenance, like when you should have certain vaccines, screening for sexually transmitted diseases, bone density testing, colonoscopy, mammograms, etc.   MAMMOGRAMS:  All women over 43 years old should have a yearly mammogram. Many facilities now offer a "3D" mammogram, which may cost around $50 extra out of pocket. If possible,  we recommend you accept the option to have the 3D mammogram performed.  It both reduces the number of women who will be called back for extra views which then turn out to be normal, and it is better than the routine mammogram at detecting truly abnormal areas.    COLONOSCOPY:  Colonoscopy to screen for colon cancer is recommended for all women at age 77.  We know, you hate the idea of the prep.  We agree, BUT, having colon cancer and not knowing it is worse!!  Colon cancer so often starts as a polyp that can be seen and removed at colonscopy, which can quite literally save your life!  And if your first colonoscopy is normal and you have no family history of colon cancer, most women don't have to have it again for 10 years.  Once every ten years, you can do something that may end up saving your life, right?  We will be happy to help you  get it scheduled when you are ready.  Be sure to check your insurance coverage so you understand how much it will cost.  It may be covered as a preventative service at no cost, but you should check your particular policy.    Try Impressa for urinary incontinence.   Mediterranean Diet A Mediterranean diet refers to food and lifestyle choices that are based on the traditions of countries located on the [employer]. This way of eating has been shown to help prevent certain conditions and improve outcomes for people who have chronic diseases, like kidney disease and  heart disease. What are tips for following this plan? Lifestyle  Cook and eat meals together with your family, when possible.  Drink enough fluid to keep your urine clear or pale yellow.  Be physically active every day. This includes: ? Aerobic exercise like running or swimming. ? Leisure activities like gardening, walking, or housework.  Get 7-8 hours of sleep each night.  If recommended by your health care provider, drink red wine in moderation. This means 1 glass a day for nonpregnant women and 2 glasses a day for men. A glass of wine equals 5 oz (150 mL). Reading food labels  Check the serving size of packaged foods. For foods such as rice and pasta, the serving size refers to the amount of cooked product, not dry.  Check the total fat in packaged foods. Avoid foods that have saturated fat or trans fats.  Check the ingredients list for added sugars, such as corn syrup.   Shopping  At the grocery store, buy most of your food from the areas near the walls of the store. This includes: ? Fresh fruits and vegetables (produce). ? Grains, beans, nuts, and seeds. Some of these may be available in unpackaged forms or large amounts (in bulk). ? Fresh seafood. ? Poultry and eggs. ? Low-fat dairy products.  Buy whole ingredients instead of prepackaged foods.  Buy fresh fruits and vegetables in-season from local farmers markets.  Buy frozen fruits and vegetables in resealable bags.  If you do not have access to quality fresh seafood, buy precooked frozen shrimp or canned fish, such as tuna, salmon, or sardines.  Buy small amounts of raw or cooked vegetables, salads, or olives from the deli or salad bar at your store.  Stock your pantry so you always have certain foods on hand, such as olive oil, canned tuna, canned tomatoes, rice, pasta, and beans. Cooking  Cook foods with extra-virgin olive oil instead of using butter or other vegetable oils.  Have meat as a side dish, and have  vegetables or grains as your main dish. This means having meat in small portions or adding small amounts of meat to foods like pasta or stew.  Use beans or vegetables instead of meat in common dishes like chili or lasagna.  Experiment with different cooking methods. Try roasting or broiling vegetables instead of steaming or sauteing them.  Add frozen vegetables to soups, stews, pasta, or rice.  Add nuts or seeds for added healthy fat at each meal. You can add these to yogurt, salads, or vegetable dishes.  Marinate fish or vegetables using olive oil, lemon juice, garlic, and fresh herbs. Meal planning  Plan to eat 1 vegetarian meal one day each week. Try to work up to 2 vegetarian meals, if possible.  Eat seafood 2 or more times a week.  Have healthy snacks readily available, such as: ? Vegetable sticks with hummus. ? [place] yogurt. ?  Fruit and nut trail mix.  Eat balanced meals throughout the week. This includes: ? Fruit: 2-3 servings a day ? Vegetables: 4-5 servings a day ? Low-fat dairy: 2 servings a day ? Fish, poultry, or lean meat: 1 serving a day ? Beans and legumes: 2 or more servings a week ? Nuts and seeds: 1-2 servings a day ? Whole grains: 6-8 servings a day ? Extra-virgin olive oil: 3-4 servings a day  Limit red meat and sweets to only a few servings a month   What are my food choices?  Mediterranean diet ? Recommended  Grains: Whole-grain pasta. Brown rice. Bulgar wheat. Polenta. Couscous. Whole-wheat bread. [place].  Vegetables: Artichokes. Beets. Broccoli. Cabbage. Carrots. Eggplant. Green beans. Chard. Kale. Spinach. Onions. Leeks. Peas. Squash. Tomatoes. Peppers. Radishes.  Fruits: Apples. Apricots. Avocado. Berries. Bananas. Cherries. Dates. Figs. Grapes. Lemons. Melon. Oranges. Peaches. Plums. Pomegranate.  Meats and other protein foods: Beans. Almonds. Sunflower seeds. Pine nuts. Peanuts. [place]. Salmon. Scallops. Shrimp. Brownlee. Tilapia. Clams.  Oysters. Eggs.  Dairy: Low-fat milk. Cheese. Greek yogurt.  Beverages: Water. Red wine. Herbal tea.  Fats and oils: Extra virgin olive oil. Avocado oil. Grape seed oil.  Sweets and desserts: [place] yogurt with honey. Baked apples. Poached pears. Trail mix.  Seasoning and other foods: Basil. Cilantro. Coriander. Cumin. Mint. Parsley. Sage. Rosemary. Tarragon. Garlic. Oregano. Thyme. Pepper. Balsalmic vinegar. Tahini. Hummus. Tomato sauce. Olives. Mushrooms. ? Limit these  Grains: Prepackaged pasta or rice dishes. Prepackaged cereal with added sugar.  Vegetables: Deep fried potatoes (french fries).  Fruits: Fruit canned in syrup.  Meats and other protein foods: Beef. Pork. Lamb. Poultry with skin. Hot dogs. Berniece Salines.  Dairy: Ice cream. Sour cream. Whole milk.  Beverages: Juice. Sugar-sweetened soft drinks. Beer. Liquor and spirits.  Fats and oils: Butter. Canola oil. Vegetable oil. Beef fat (tallow). Lard.  Sweets and desserts: Cookies. Cakes. Pies. Candy.  Seasoning and other foods: Mayonnaise. Premade sauces and marinades. The items listed may not be a complete list. Talk with your dietitian about what dietary choices are right for you. Summary  The Mediterranean diet includes both food and lifestyle choices.  Eat a variety of fresh fruits and vegetables, beans, nuts, seeds, and whole grains.  Limit the amount of red meat and sweets that you eat.  Talk with your health care provider about whether it is safe for you to drink red wine in moderation. This means 1 glass a day for nonpregnant women and 2 glasses a day for men. A glass of wine equals 5 oz (150 mL). This information is not intended to replace advice given to you by your health care provider. Make sure you discuss any questions you have with your health care provider. Document Revised: 11/30/2015 Document Reviewed: 11/23/2015 Elsevier Patient Education  [place].

## 2020-07-26 ENCOUNTER — Other Ambulatory Visit: Payer: Self-pay

## 2020-07-26 ENCOUNTER — Other Ambulatory Visit: Payer: Self-pay | Admitting: Obstetrics and Gynecology

## 2020-07-26 ENCOUNTER — Ambulatory Visit (INDEPENDENT_AMBULATORY_CARE_PROVIDER_SITE_OTHER): Payer: 59

## 2020-07-26 ENCOUNTER — Other Ambulatory Visit: Payer: Self-pay | Admitting: Family

## 2020-07-26 DIAGNOSIS — Z780 Asymptomatic menopausal state: Secondary | ICD-10-CM

## 2020-07-26 DIAGNOSIS — M8589 Other specified disorders of bone density and structure, multiple sites: Secondary | ICD-10-CM

## 2020-07-26 DIAGNOSIS — M8580 Other specified disorders of bone density and structure, unspecified site: Secondary | ICD-10-CM

## 2020-09-08 ENCOUNTER — Encounter (INDEPENDENT_AMBULATORY_CARE_PROVIDER_SITE_OTHER): Payer: 59 | Admitting: Ophthalmology

## 2020-09-20 NOTE — Progress Notes (Signed)
[facility] Note  09/21/2020     CHIEF COMPLAINT Patient presents for Retina Follow Up   HISTORY OF PRESENT ILLNESS: Ellen Morrow is a 65 y.o. female who presents to the clinic today for:   HPI     Retina Follow Up   Patient presents with  Other.  In both eyes.  Duration of 1 year.  Since onset it is stable.  I, the attending physician,  performed the HPI with the patient and updated documentation appropriately.        Comments   1 year follow up Retinal drusen OU- Doing well.  She did update her glasses Rx since last visit. Using AT for dry eyes or allergy OU.        Last edited by Bernarda Caffey, MD on 09/21/2020 11:58 PM.    Patient saw Dr. Genevie Ann at [facility] and got a stronger glasses rx, no other eye problems, no new fol or floaters  Referring physician: Ok Edwards, OD [address] [place], [place] [address]  HISTORICAL INFORMATION:   Selected notes from the MEDICAL RECORD NUMBER Referred by Dr. Thom Chimes for concern of Drusen OU   CURRENT MEDICATIONS: No current outpatient medications on file. (Ophthalmic Drugs)   No current facility-administered medications for this visit. (Ophthalmic Drugs)   Current Outpatient Medications (Other)  Medication Sig   Ascorbic Acid (VITAMIN C) 1000 MG tablet Take 1,000 mg by mouth daily.   Calcium Carbonate-Vitamin D 600-400 MG-UNIT tablet Take 1 tablet by mouth daily.   estradiol (ESTRACE VAGINAL) 0.1 MG/GM vaginal cream Place 1/2 gram per vagina at hs two to three times per week.   LIDODERM 5 % Place 1 patch onto the skin every 12 (twelve) hours as needed. Pt applies as needed   Magnesium 100 MG CAPS Take by mouth.   Multiple Vitamins-Minerals (PRESERVISION AREDS 2 PO) Take by mouth.   No current facility-administered medications for this visit. (Other)      REVIEW OF SYSTEMS: ROS   Positive for: Musculoskeletal, Eyes Negative for: Constitutional, Gastrointestinal,  Neurological, Skin, Genitourinary, HENT, Endocrine, Cardiovascular, Respiratory, Psychiatric, Allergic/Imm, Heme/Lymph Last edited by Leonie Douglas, COA on 09/21/2020  8:31 AM.       ALLERGIES Allergies  Allergen Reactions   Penicillins Rash   Tetracyclines & Related Rash    PAST MEDICAL HISTORY Past Medical History:  Diagnosis Date   Arthritis of right knee 02/07/2013   Chicken pox as a child   COVID-19 virus infection 03/2019, 05/2020   Headache 06/07/2014   History of dental surgery 05/2019   Hx: UTI (urinary tract infection) 02/07/2013   Hyperlipidemia, mixed 08/21/2014   Insomnia 08/15/2015   Low back pain 11/17/2016   Measles as a child   Mumps as a child   Osteoarthritis of both knees 02/07/2013   Osteopenia 11/12/2016   Preventative health care 02/07/2013   Sacroiliac dysfunction 11/12/2016   TMJ syndrome    UTI (urinary tract infection)    Past Surgical History:  Procedure Laterality Date   APPENDECTOMY  3 rd grade   MOLE REMOVAL     benign- mole on right thigh   scar tissue break up  2006   Dr Alfred Levins   WISDOM TOOTH EXTRACTION  as a teenager    FAMILY HISTORY Family History  Problem Relation Age of Onset   Osteoporosis Mother    Diabetes Mother        type 2  Cancer Mother 68       stomach   Colon polyps Mother        benign   Colon cancer Mother 66       Tumor in colon removed Stage 2   Glaucoma Mother    Macular degeneration Mother    Hypertension Father    Hyperlipidemia Father    Other Sister        herniated disk in back, low blood pressure   Colon cancer Sister    Hernia Brother        X 2   ADD / ADHD Brother    Hypertension Sister    Depression Sister    Cancer Maternal Grandmother        ? lung vs breast   Cancer Maternal Aunt    COPD Paternal Aunt    Dementia Paternal Aunt    Cancer Paternal Uncle     SOCIAL HISTORY Social History   Tobacco Use   Smoking status: Never   Smokeless tobacco: Never  Vaping Use   Vaping Use:  Never used  Substance Use Topics   Alcohol use: Yes    Alcohol/week: 1.0 standard drink    Types: 1 Standard drinks or equivalent per week    Comment: occasional beer  2-3/month   Drug use: No         OPHTHALMIC EXAM:  Base Eye Exam     Visual Acuity (Snellen - Linear)       Right Left   Dist cc 20/20 20/20    Correction: Glasses         Tonometry (Tonopen, 8:37 AM)       Right Left   Pressure 15 15         Pupils       Dark Light Shape React APD   Right 3 2 Round Brisk None   Left 3 2 Round Brisk None         Visual Fields (Counting fingers)       Left Right    Full Full         Extraocular Movement       Right Left    Full Full         Neuro/Psych     Oriented x3: Yes   Mood/Affect: Normal         Dilation     Both eyes: 1.0% Mydriacyl, 2.5% Phenylephrine @ 8:46 AM           Slit Lamp and Fundus Exam     Slit Lamp Exam       Right Left   Lids/Lashes Dermatochalasis - upper lid Dermatochalasis - upper lid   Conjunctiva/Sclera mild temporal pinguecula mild temporal pinguecula   Cornea 1+Punctate epithelial erosions, mild arcus 1+Punctate epithelial erosions, mild arcus   Anterior Chamber deep, narrow temporal angle deep, narrow temporal angle   Iris round and dilated round and dilated   Lens 2+ NS, 2+cortical cataract 2+ NS, 2+cortical cataract   Vitreous syneresis syneresis         Fundus Exam       Right Left   Disc pink and sharp, mild inferior PPP pink and sharp   C/D Ratio 0.5 0.3   Macula Flat, good foveal reflex, mild RPE mottling, no heme or edema Flat, good foveal reflex, mild RPE mottling and clumping, no heme or edema   Vessels mild attenuation, mild A/V crossing changes mild attenuation, mild tortuousity   Periphery attached,  mid-zonal drusen and reticular degeneration attached, mid-zonal drusen, focal pigmented chorioretinal scar at 5:00, no RT or SRF           Refraction     Wearing Rx        Sphere Cylinder Axis   Right +2.00 +0.50 150   Left +2.00 +0.25 165    Type: PAL            IMAGING AND PROCEDURES  Imaging and Procedures for @TODAY @  OCT, Retina - OU - Both Eyes       Right Eye Quality was good. Central Foveal Thickness: 232. Progression has been stable. Findings include normal foveal contour, no IRF, no SRF, vitreomacular adhesion (Rare drusen).   Left Eye Quality was good. Central Foveal Thickness: 232. Progression has been stable. Findings include normal observations, normal foveal contour, no IRF, no SRF, vitreomacular adhesion (Rare drusen).   Notes *Images captured and stored on drive  Diagnosis / Impression:  NFP, no IRF/SRF OU, VMA OU Rare drusen OU  Clinical management:  See below  Abbreviations: NFP - Normal foveal profile. CME - cystoid macular edema. PED - pigment epithelial detachment. IRF - intraretinal fluid. SRF - subretinal fluid. EZ - ellipsoid zone. ERM - epiretinal membrane. ORA - outer retinal atrophy. ORT - outer retinal tubulation. SRHM - subretinal hyper-reflective material              ASSESSMENT/PLAN:    ICD-10-CM   1. Retinal drusen of both eyes  H35.363     2. Retinal edema  H35.81 OCT, Retina - OU - Both Eyes    3. Combined forms of age-related cataract of both eyes  H25.813       1. Retina Drusen OU  - midzonal peripheral drusen OU -- not visually significant -- stable  - BCVA 20/20 OU  - pt reports +family history of exudative ARMD in mother who was 68+ yrs old -- treated at [facility]  - discussed findings, prognosis  - monitor  - f/u in 12 months  2. No retinal edema on exam or OCT  3. Age related cataracts OU  - The symptoms of cataract, surgical options, and treatments and risks were discussed with patient.  - discussed diagnosis and progression  - not yet visually significant  - monitor for now    Ophthalmic Meds Ordered this visit:  No orders of the defined types were placed in this  encounter.      Return in about 1 year (around 09/21/2021) for f/u retinal drusen OU, DFE, OCT.  There are no Patient Instructions on file for this visit.   Explained the diagnoses, plan, and follow up with the patient and they expressed understanding.  Patient expressed understanding of the importance of proper follow up care.   This document serves as a record of services personally performed by Gardiner Sleeper, MD, PhD. It was created on their behalf by Leonie Douglas, an ophthalmic technician. The creation of this record is the provider's dictation and/or activities during the visit.    Electronically signed by: Leonie Douglas COA, 09/22/20  12:10 AM  This document serves as a record of services personally performed by Gardiner Sleeper, MD, PhD. It was created on their behalf by San Jetty. Owens Shark, OA an ophthalmic technician. The creation of this record is the provider's dictation and/or activities during the visit.    Electronically signed by: San Jetty. Owens Shark, [place] 06.09.2022 12:10 AM   Gardiner Sleeper, M.D., Ph.D. Diseases & Surgery  of the Retina and [place] 09/22/2019   I have reviewed the above documentation for accuracy and completeness, and I agree with the above. Gardiner Sleeper, M.D., Ph.D. 09/22/20 12:10 AM   Abbreviations: M myopia (nearsighted); A astigmatism; H hyperopia (farsighted); P presbyopia; Mrx spectacle prescription;  CTL contact lenses; OD right eye; OS left eye; OU both eyes  XT exotropia; ET esotropia; PEK punctate epithelial keratitis; PEE punctate epithelial erosions; DES dry eye syndrome; MGD meibomian gland dysfunction; ATs artificial tears; PFAT's preservative free artificial tears; [place] nuclear sclerotic cataract; PSC posterior subcapsular cataract; ERM epi-retinal membrane; PVD posterior vitreous detachment; RD retinal detachment; DM diabetes mellitus; DR diabetic retinopathy; NPDR non-proliferative diabetic retinopathy; PDR  proliferative diabetic retinopathy; CSME clinically significant macular edema; DME diabetic macular edema; dbh dot blot hemorrhages; CWS cotton wool spot; POAG primary open angle glaucoma; C/D cup-to-disc ratio; HVF humphrey visual field; GVF goldmann visual field; OCT optical coherence tomography; IOP intraocular pressure; BRVO Branch retinal vein occlusion; CRVO central retinal vein occlusion; CRAO central retinal artery occlusion; BRAO branch retinal artery occlusion; RT retinal tear; SB scleral buckle; PPV pars plana vitrectomy; VH Vitreous hemorrhage; PRP panretinal laser photocoagulation; IVK intravitreal kenalog; VMT vitreomacular traction; MH Macular hole;  NVD neovascularization of the disc; NVE neovascularization elsewhere; AREDS age related eye disease study; ARMD age related macular degeneration; POAG primary open angle glaucoma; EBMD epithelial/anterior basement membrane dystrophy; ACIOL anterior chamber intraocular lens; IOL intraocular lens; PCIOL posterior chamber intraocular lens; Phaco/IOL phacoemulsification with intraocular lens placement; Reynolds photorefractive keratectomy; LASIK laser assisted in situ keratomileusis; HTN hypertension; DM diabetes mellitus; COPD chronic obstructive pulmonary disease

## 2020-09-21 ENCOUNTER — Ambulatory Visit (INDEPENDENT_AMBULATORY_CARE_PROVIDER_SITE_OTHER): Payer: No Typology Code available for payment source | Admitting: Ophthalmology

## 2020-09-21 ENCOUNTER — Other Ambulatory Visit: Payer: Self-pay

## 2020-09-21 DIAGNOSIS — H3581 Retinal edema: Secondary | ICD-10-CM

## 2020-09-21 DIAGNOSIS — H25813 Combined forms of age-related cataract, bilateral: Secondary | ICD-10-CM | POA: Diagnosis not present

## 2020-09-21 DIAGNOSIS — H35363 Drusen (degenerative) of macula, bilateral: Secondary | ICD-10-CM

## 2020-09-22 ENCOUNTER — Encounter (INDEPENDENT_AMBULATORY_CARE_PROVIDER_SITE_OTHER): Payer: Self-pay | Admitting: Ophthalmology

## 2020-09-28 ENCOUNTER — Encounter: Payer: Self-pay | Admitting: Sports Medicine

## 2020-09-28 ENCOUNTER — Other Ambulatory Visit: Payer: Self-pay

## 2020-09-28 ENCOUNTER — Ambulatory Visit: Payer: No Typology Code available for payment source | Admitting: Sports Medicine

## 2020-09-28 DIAGNOSIS — M79674 Pain in right toe(s): Secondary | ICD-10-CM | POA: Diagnosis not present

## 2020-09-28 DIAGNOSIS — M79672 Pain in left foot: Secondary | ICD-10-CM

## 2020-09-28 DIAGNOSIS — M2042 Other hammer toe(s) (acquired), left foot: Secondary | ICD-10-CM

## 2020-09-28 DIAGNOSIS — M21619 Bunion of unspecified foot: Secondary | ICD-10-CM | POA: Diagnosis not present

## 2020-09-28 DIAGNOSIS — M2041 Other hammer toe(s) (acquired), right foot: Secondary | ICD-10-CM | POA: Diagnosis not present

## 2020-09-28 DIAGNOSIS — L84 Corns and callosities: Secondary | ICD-10-CM

## 2020-09-28 DIAGNOSIS — M79671 Pain in right foot: Secondary | ICD-10-CM

## 2020-09-28 MED ORDER — NEOMYCIN-POLYMYXIN-HC 3.5-10000-1 OT SOLN: OTIC | 0 refills | Status: DC

## 2020-09-28 NOTE — Progress Notes (Signed)
Subjective: Ellen Morrow is a 65 y.o. female patient who presents to office for evaluation of Right> Left foot pain secondary to callus skin on the ball of the left foot and swelling and pain adjacent to the toenail on the right states that she went for a pedicure and they were digging up some dead skin and after that it became more sore and very tender to touch with a little bit of redness and looks like a hard area if there.  Patient also reports that she is always had problems with callus buildup on the ball of the left foot but over the last few weeks after she went for a run had some pain and swelling but now the area is much better and no longer is painful.  Patient denies any opening or drainage. Patient denies any other pedal complaints.   Review of Systems  All other systems reviewed and are negative.   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     Calcium Carbonate-Vitamin D 600-400 MG-UNIT tablet Take 1 tablet by mouth daily.     estradiol (ESTRACE VAGINAL) 0.1 MG/GM vaginal cream Place 1/2 gram per vagina at hs two to three times per week. 42.5 g 1   LIDODERM 5 % Place 1 patch onto the skin every 12 (twelve) hours as needed. Pt applies as needed 30 patch 2   Magnesium 100 MG CAPS Take by mouth.     Multiple Vitamins-Minerals  (PRESERVISION AREDS 2 PO) Take by mouth.     No current facility-administered medications on file prior to visit.    Allergies  Allergen Reactions   Penicillins Rash   Tetracyclines & Related Rash    Objective:  General: Alert and oriented x3 in no acute distress  Dermatology: Keratotic lesion present submet 2 on left with skin lines transversing the lesion, pain is present with direct pressure to the lesion with a central nucleated core noted, no webspace macerations, no ecchymosis bilateral, all nails x 10 are well manicured upon closer evaluation of the right hallux nail fold and lateral nail border there is no obvious ingrowing but there was a small area of swelling noted to the nail fold and very minimal dried dead skin to the area with no active drainage no warmth.  Vascular: Dorsalis Pedis and Posterior Tibial pedal pulses 2/4, Capillary Fill Time 3 seconds, + pedal hair growth bilateral, no edema bilateral lower extremities, Temperature gradient within normal limits.  Neurology: Johney Maine sensation intact via light touch bilateral.  Musculoskeletal: Mild tenderness with palpation at the keratotic lesion site on the ball of the left foot and mild tenderness to the nail fold of the right hallux lateral margin which likely could be mechanical due to her bunion and second toe rubbing against each other.  Significant bunion and hammertoe deformity noted bilateral.  Assessment and Plan: Problem List Items Addressed This Visit   None Visit Diagnoses     Pain around toenail, right foot    -  Primary   Callus of foot       Bunion       Hammer toes of both feet       Foot pain, bilateral           -Complete examination performed -Discussed treatment options -Parred keratoic lesion using a chisel blade; treated the area withSalinocaine covered with moleskin -Encouraged daily skin emollients especially use of Vaseline after bath or shower -Encouraged use of pumice Kahl -Advised good  supportive shoes and cushions or insoles that do not overcrowded toes -Using a sterile nail nipper debrided the right hallux toenail and investigated the lateral margin with no acute ingrowing noted -Prescribed Corticosporin solution and advised patient to soak with warm water and Epson salt daily for 20 minutes for the next week and then after to apply the Corticosporin solution after a week if the swelling and pain is resolved may monitor if area worsens or fail to continue to improve advised patient that she will need to return to office for a ingrown nail procedure however at this time there appears to not be an acute indication to do the procedure today -Patient to return to office as needed or sooner if condition worsens.  Landis Martins, DPM

## 2020-09-29 LAB — HM MAMMOGRAPHY

## 2020-12-26 ENCOUNTER — Ambulatory Visit: Payer: No Typology Code available for payment source | Admitting: Family Medicine

## 2020-12-26 ENCOUNTER — Encounter: Payer: Self-pay | Admitting: Family Medicine

## 2020-12-26 ENCOUNTER — Other Ambulatory Visit: Payer: Self-pay

## 2020-12-26 VITALS — BP 110/68 | HR 70 | Temp 98.2°F | Ht 65.5 in | Wt 152.1 lb

## 2020-12-26 DIAGNOSIS — H8110 Benign paroxysmal vertigo, unspecified ear: Secondary | ICD-10-CM

## 2020-12-26 NOTE — Patient Instructions (Addendum)
Stay hydrated.  I think we are fine for now. You could consider doing the Epley maneuvers again if you have a recurrence. I would want to know if you have another episode on MyChart and we will set you up with a vestibular rehab specialist.   Nothing sinister appears to be going on.   Let us know if you need anything.

## 2020-12-26 NOTE — Progress Notes (Signed)
Chief Complaint  Patient presents with   Dizziness   Nausea    Ellen Morrow is 65 y.o. pt here for dizziness.  Duration: 10 days Pass out? No Spinning? No; lasts for a few seconds Worse when she lays down/moves in bed or looks up.  Recent illness/fever? No Headache? No Neurologic signs? No Change in PO intake? No No new meds.  No palpitations.  Tried the Epley maneuvers 2-3 times per day with some improvement.   Past Medical History:  Diagnosis Date   Arthritis of right knee 02/07/2013   Chicken pox as a child   COVID-19 virus infection 03/2019, 05/2020   Headache 06/07/2014   History of dental surgery 05/2019   Hx: UTI (urinary tract infection) 02/07/2013   Hyperlipidemia, mixed 08/21/2014   Insomnia 08/15/2015   Low back pain 11/17/2016   Measles as a child   Mumps as a child   Osteoarthritis of both knees 02/07/2013   Osteopenia 11/12/2016   Preventative health care 02/07/2013   Sacroiliac dysfunction 11/12/2016   TMJ syndrome    UTI (urinary tract infection)     Family History  Problem Relation Age of Onset   Osteoporosis Mother    Diabetes Mother        type 2   Cancer Mother 64       stomach   Colon polyps Mother        benign   Colon cancer Mother 56       Tumor in colon removed Stage 2   Glaucoma Mother    Macular degeneration Mother    Hypertension Father    Hyperlipidemia Father    Other Sister        herniated disk in back, low blood pressure   Colon cancer Sister    Hernia Brother        X 2   ADD / ADHD Brother    Hypertension Sister    Depression Sister    Cancer Maternal Grandmother        ? lung vs breast   Cancer Maternal Aunt    COPD Paternal Aunt    Dementia Paternal Aunt    Cancer Paternal Uncle     Allergies as of 12/26/2020       Reactions   Penicillins Rash   Tetracyclines & Related Rash        Medication List        Accurate as of December 26, 2020  2:44 PM. If you have any questions, ask your nurse or doctor.           STOP taking these medications    neomycin-polymyxin-hydrocortisone OTIC solution Commonly known as: CORTISPORIN Stopped by: Shelda Pal, DO   vitamin C 1000 MG tablet Stopped by: Shelda Pal, DO       TAKE these medications    CALCIUM 600 PO Take 1 tablet by mouth daily.   Calcium Carbonate-Vitamin D 600-400 MG-UNIT tablet Take 1 tablet by mouth daily.   estradiol 0.1 MG/GM vaginal cream Commonly known as: ESTRACE VAGINAL Place 1/2 gram per vagina at hs two to three times per week.   Fish Oil 1200 MG Caps Take 1 capsule by mouth daily.   Lidoderm 5 % Generic drug: lidocaine Place 1 patch onto the skin every 12 (twelve) hours as needed. Pt applies as needed   Magnesium 100 MG Caps Take by mouth.   Vitamin D3 Complete Tabs Take 1 tablet by mouth daily. What changed: Another  medication with the same name was removed. Continue taking this medication, and follow the directions you see here. Changed by: Shelda Pal, DO        BP 110/68   Pulse 70   Temp 98.2 F (36.8 C) (Oral)   Ht 5' 5.5" (1.664 m)   Wt 152 lb 2 oz (69 kg)   LMP 04/15/2008   SpO2 97%   BMI 24.93 kg/m  General: Awake, alert, appears stated age Eyes: PERRLA, EOMi Ears: Patent, TM's neg b/l Heart: RRR, no murmurs, no carotid bruits Lungs: CTAB, no accessory muscle use MSK: 5/5 strength throughout, gait normal Neuro: No cerebellar signs, patellar reflex 3/4 b/l wo clonus, calcaneal reflex 1/4 b/l wo clonus, biceps reflex 2/4 b/l wo clonus; Dix-[place]-P[place] negative b/l. Psych: Age appropriate judgment and insight, normal mood and affect  Benign paroxysmal positional vertigo, unspecified laterality  I think this resolved on its own. No red flag s/s's. If this recurs, she will return to Epley Maneuvers and send me a message. I will then set her up w vest rehab.  F/u prn. Pt voiced understanding and agreement to the plan.  [place],  DO 12/26/20 2:44 PM

## 2021-02-01 ENCOUNTER — Other Ambulatory Visit: Payer: No Typology Code available for payment source

## 2021-02-05 ENCOUNTER — Encounter: Payer: No Typology Code available for payment source | Admitting: Family Medicine

## 2021-06-21 ENCOUNTER — Ambulatory Visit: Payer: No Typology Code available for payment source | Admitting: Sports Medicine

## 2021-06-28 ENCOUNTER — Encounter: Payer: Self-pay | Admitting: Obstetrics and Gynecology

## 2021-06-28 ENCOUNTER — Other Ambulatory Visit: Payer: Self-pay

## 2021-06-28 ENCOUNTER — Ambulatory Visit (INDEPENDENT_AMBULATORY_CARE_PROVIDER_SITE_OTHER): Payer: No Typology Code available for payment source | Admitting: Obstetrics and Gynecology

## 2021-06-28 VITALS — BP 100/68 | HR 64 | Ht 65.5 in | Wt 150.0 lb

## 2021-06-28 DIAGNOSIS — Z1231 Encounter for screening mammogram for malignant neoplasm of breast: Secondary | ICD-10-CM

## 2021-06-28 DIAGNOSIS — N393 Stress incontinence (female) (male): Secondary | ICD-10-CM

## 2021-06-28 DIAGNOSIS — Z780 Asymptomatic menopausal state: Secondary | ICD-10-CM | POA: Diagnosis not present

## 2021-06-28 DIAGNOSIS — Z01419 Encounter for gynecological examination (general) (routine) without abnormal findings: Secondary | ICD-10-CM | POA: Diagnosis not present

## 2021-06-28 MED ORDER — ESTRADIOL 0.1 MG/GM VA CREA: TOPICAL_CREAM | VAGINAL | 1 refills | Status: DC

## 2021-06-28 NOTE — Progress Notes (Signed)
66 y.o. G2P2 Married Caucasian female here for annual exam.   ? ?Urinary incontinence with running. ?Not consistent but may be related to how much fluid she drinks before running.  ?Needs to wear a pad.  ?Does leak with laugh, sneeze if she has not just gone to the bathroom.  ?She may have leakage if her bladder is full.  She does map out bathrooms.  ?DF:  every 3 hours ?NF:  maybe once a night if her husband wakes her up. ?Voids well. ?Denies constipation.  ?Denies fecal incontinence. ?Does drink a lot of fluids.  ? ?Has done pelvic floor therapy. ? ?Using vaginal estrogen cream.  ? ?Seeing chiropractor for hip and knee issues. ? ?PCP: Sela Hilding, MD ? ?Patient's last menstrual period was 04/15/2008.     ?  ?    ?Sexually active: Yes.    ?The current method of family planning is post menopausal status.    ?Exercising: Yes.     running ?Smoker:  no ? ?Health Maintenance: ?Pap:   06-17-18 Neg:Neg HR HPV, 05-29-15 Neg:Neg HR HPV, 04-15-12 normal per patient ?History of abnormal Pap:  no ?MMG:  09-29-20 Neg/BiRads1 ?Colonoscopy:   07/24/16 benign polyp removed; f/u 5 yrs -- will call to schedule ?BMD:  11-12-16,07-26-20  Result :Osteopenia ?TDaP:  06/2013 ?Gardasil:   n/a ?KDX:IPJASNK blood in the past ?Hep C: 06-07-16 Neg ?Screening Labs:  PCP ? ? reports that she has never smoked. She has never used smokeless tobacco. She reports current alcohol use of about 1.0 standard drink per week. She reports that she does not use drugs. ? ?Past Medical History:  ?Diagnosis Date  ? Arthritis of right knee 02/07/2013  ? Chicken pox as a child  ? COVID-19 virus infection 03/2019, 05/2020  ? Headache 06/07/2014  ? History of dental surgery 05/2019  ? Hx: UTI (urinary tract infection) 02/07/2013  ? Hyperlipidemia, mixed 08/21/2014  ? Insomnia 08/15/2015  ? Low back pain 11/17/2016  ? Measles as a child  ? Mumps as a child  ? Osteoarthritis of both knees 02/07/2013  ? Osteopenia 11/12/2016  ? Preventative health care 02/07/2013  ? 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 TMJ syndrome   ? UTI (urinary tract infection)   ? ? ?Past Surgical History:  ?Procedure Laterality Date  ? APPENDECTOMY  3 rd grade  ? MOLE REMOVAL    ? benign- mole on right thigh  ? scar tissue break up  2006  ? Dr Alfred Levins  ? WISDOM TOOTH EXTRACTION  as a teenager  ? ? ?Current Outpatient Medications  ?Medication Sig Dispense Refill  ? Calcium Carbonate-Vitamin D 600-400 MG-UNIT tablet Take 1 tablet by mouth daily.    ? Cholecalciferol (VITAMIN D3) 125 MCG (5000 UT) CAPS Take by mouth.    ? estradiol (ESTRACE VAGINAL) 0.1 MG/GM vaginal cream Place 1/2 gram per vagina at hs two to three times per week. 42.5 g 1  ? LIDODERM 5 % Place 1 patch onto the skin every 12 (twelve) hours as needed. Pt applies as needed 30 patch 2  ? Magnesium 100 MG CAPS Take by mouth.    ? Multiple Vitamins-Minerals (PRESERVISION AREDS 2 PO) Take by mouth.    ? Omega-3 Fatty Acids (FISH OIL) 1200 MG CAPS Take 1 capsule by mouth daily.    ? ?No current facility-administered medications for this visit.  ? ? ?Family History  ?Problem Relation Age of Onset  ? Osteoporosis Mother   ? Diabetes Mother   ?  type 2  ? Cancer Mother 67  ?     stomach  ? Colon polyps Mother   ?     benign  ? Colon cancer Mother 91  ?     Tumor in colon removed Stage 2  ? Glaucoma Mother   ? Macular degeneration Mother   ? Hypertension Father   ? Hyperlipidemia Father   ? Other Sister   ?     herniated disk in back, low blood pressure  ? Colon cancer Sister   ? Hernia Brother   ?     X 2  ? ADD / ADHD Brother   ? Hypertension Sister   ? Depression Sister   ? Cancer Maternal Grandmother   ?     ? lung vs breast  ? Cancer Maternal Aunt   ? COPD Paternal Aunt   ? Dementia Paternal Aunt   ? Cancer Paternal Uncle   ? ? ?Review of Systems  ?Genitourinary:   ?     Urinary incontinence  ?All other systems reviewed and are negative. ? ?Exam:   ?BP 100/68   Pulse 64   Ht 5' 5.5" (1.664 m)   Wt 150 lb (68 kg)   LMP 04/15/2008   SpO2 98%    BMI 24.58 kg/m?     ?General appearance: alert, cooperative and appears stated age ?Head: normocephalic, without obvious abnormality, atraumatic ?Neck: no adenopathy, supple, symmetrical, trachea midline and thyroid normal to inspection and palpation ?Lungs: clear to auscultation bilaterally ?Breasts: normal appearance, no masses or tenderness, No nipple retraction or dimpling, No nipple discharge or bleeding, No axillary adenopathy ?Heart: regular rate and rhythm ?Abdomen: soft, non-tender; no masses, no organomegaly ?Extremities: extremities normal, atraumatic, no cyanosis or edema ?Skin: skin color, texture, turgor normal. No rashes or lesions ?Lymph nodes: cervical, supraclavicular, and axillary nodes normal. ?Neurologic: grossly normal ? ?Pelvic: External genitalia:  no lesions ?             No abnormal inguinal nodes palpated. ?             Urethra:  normal appearing urethra with no masses, tenderness or lesions ?             Bartholins and Skenes: normal    ?             Vagina: normal appearing vagina with normal color and discharge, no lesions.  Good support.  ?             Cervix: no lesions ?             Pap taken: no ?Bimanual Exam:  Uterus:  normal size, contour, position, consistency, mobility, non-tender ?             Adnexa: no mass, fullness, tenderness ?             Rectal exam: yes.  Confirms. ?             Anus:  normal sphincter tone, no lesions ? ?Chaperone was present for exam:  Estill Bamberg, CMA ? ?Assessment:   ?Well woman visit with gynecologic exam. ?GSI. ?Atrophy. ?FH colon cancers.  ?Osteopenia.   ? ?Plan: ?Mammogram screening ordered at the [facility] per patient request. ?Self breast awareness reviewed. ?Pap and HR HPV as above. ?Guidelines for Calcium, Vitamin D, regular exercise program including cardiovascular and weight bearing exercise. ?Rx for Vaginal Estrace cream.  We discussed potential effect on breast cancer.  ?BMD ordered at [facility].  ?Follow up annually and prn.   ? ?Additional counseling given for stress incontinence.  ?We discussed stress incontinence and treatment options of pelvic floor therapy, pessary, and midurethral sling.  ?We discussed urodynamic testing prior to performing surgical care for stress incontinence. ?She will return for a pessary fitting.  ? ?After visit summary provided.  ? ? ? ?

## 2021-06-28 NOTE — Patient Instructions (Signed)
EXERCISE AND DIET:  We recommended that you start or continue a regular exercise program for good health. Regular exercise means any activity that makes your heart beat faster and makes you sweat.  We recommend exercising at least 30 minutes per day at least 3 days a week, preferably 4 or 5.  We also recommend a diet low in fat and sugar.  Inactivity, poor dietary choices and obesity can cause diabetes, heart attack, stroke, and kidney damage, among others.   ? ?ALCOHOL AND SMOKING:  Women should limit their alcohol intake to no more than 7 drinks/beers/glasses of wine (combined, not each!) per week. Moderation of alcohol intake to this level decreases your risk of breast cancer and liver damage. And of course, no recreational drugs are part of a healthy lifestyle.  And absolutely no smoking or even second hand smoke. Most people know smoking can cause heart and lung diseases, but did you know it also contributes to weakening of your bones? Aging of your skin?  Yellowing of your teeth and nails? ? ?CALCIUM AND VITAMIN D:  Adequate intake of calcium and Vitamin D are recommended.  The recommendations for exact amounts of these supplements seem to change often, but generally speaking 600 mg of calcium (either carbonate or citrate) and 800 units of Vitamin D per day seems prudent. Certain women may benefit from higher intake of Vitamin D.  If you are among these women, your doctor will have told you during your visit.   ? ?PAP SMEARS:  Pap smears, to check for cervical cancer or precancers,  have traditionally been done yearly, although recent scientific advances have shown that most women can have pap smears less often.  However, every woman still should have a physical exam from her gynecologist every year. It will include a breast check, inspection of the vulva and vagina to check for abnormal growths or skin changes, a visual exam of the cervix, and then an exam to evaluate the size and shape of the uterus and  ovaries.  And after 65 years of age, a rectal exam is indicated to check for rectal cancers. We will also provide age appropriate advice regarding health maintenance, like when you should have certain vaccines, screening for sexually transmitted diseases, bone density testing, colonoscopy, mammograms, etc.  ? ?MAMMOGRAMS:  All women over 45 years old should have a yearly mammogram. Many facilities now offer a "3D" mammogram, which may cost around $50 extra out of pocket. If possible,  we recommend you accept the option to have the 3D mammogram performed.  It both reduces the number of women who will be called back for extra views which then turn out to be normal, and it is better than the routine mammogram at detecting truly abnormal areas.   ? ?COLONOSCOPY:  Colonoscopy to screen for colon cancer is recommended for all women at age 18.  We know, you hate the idea of the prep.  We agree, BUT, having colon cancer and not knowing it is worse!!  Colon cancer so often starts as a polyp that can be seen and removed at colonscopy, which can quite literally save your life!  And if your first colonoscopy is normal and you have no family history of colon cancer, most women don't have to have it again for 10 years.  Once every ten years, you can do something that may end up saving your life, right?  We will be happy to help you get it scheduled when you are ready.  Be sure to check your insurance coverage so you understand how much it will cost.  It may be covered as a preventative service at no cost, but you should check your particular policy.   ? ?Urethral Vaginal Sling ?A urethral vaginal sling procedure is surgery to correct urinary incontinence. Urinary incontinence is passing urine without one's control. It is common in older women and in women who have had children. In this surgery, a strong piece of material is placed under the tube that drains the bladder (urethra). This sling is made of tension-free vaginal tape or  nylon mesh. It fits under the urethra like a hammock. The sling is put in position to straighten, support, and hold the urethra in its normal position. ?Tell a health care provider about: ?Any allergies you have. ?All medicines you are taking, including vitamins, herbs, eye drops, creams, and over-the-counter medicines. ?Any problems you or family members have had with anesthetic medicines. ?Any blood disorders you have. ?Any surgeries you have had. ?Any medical conditions you have. ?Whether you are pregnant or may be pregnant. ?What are the risks? ?Generally, this is a safe procedure. However, problems may occur, including: ?Infection. ?Excessive bleeding. ?Allergic reactions to medicines. ?Damage to nearby structures or organs. ?Problems urinating for several days or weeks. ?Return of the urinary incontinence. ?Mesh failure. Be sure to talk with your health care provider about the options you have for sling material and the risks associated with each material. ?What happens before the procedure? ?Staying hydrated ?Follow instructions from your health care provider about hydration, which may include: ?Up to 2 hours before the procedure - you may continue to drink clear liquids, such as water, clear fruit juice, black coffee, and plain tea. ? ?Eating and drinking restrictions ?Follow instructions from your health care provider about eating and drinking, which may include: ?8 hours before the procedure - stop eating heavy meals or foods, such as meat, fried foods, or fatty foods. ?6 hours before the procedure - stop eating light meals or foods, such as toast or cereal. ?6 hours before the procedure - stop drinking milk or drinks that contain milk. ?2 hours before the procedure - stop drinking clear liquids. ?Medicines ?Ask your health care provider about: ?Changing or stopping your regular medicines. This is especially important if you are taking diabetes medicines or blood thinners. ?Taking medicines such as aspirin  and ibuprofen. These medicines can thin your blood. Do not take these medicines unless your health care provider tells you to take them. ?Taking over-the-counter medicines, vitamins, herbs, and supplements. ?Surgery safety ?Ask your health care provider: ?How your surgery site will be marked. ?What steps will be taken to help prevent infection. These steps may include: ?Removing hair at the surgery site. ?Washing skin with a germ-killing soap. ?Taking antibiotic medicine. ?General instructions ?Do not use any products that contain nicotine or tobacco for at least 4 weeks before the procedure. These products include cigarettes, chewing tobacco, and vaping devices, such as e-cigarettes. If you need help quitting, ask your health care provider. ?Plan to have a responsible adult take you home from the hospital or clinic. ?What happens during the procedure? ?An IV will be inserted into one of your veins. ?You will be given one or both of the following: ?A medicine to make you fall asleep (general anesthetic). ?A medicine that is injected into your spine to numb the area below the injection site (spinal anesthetic). ?A catheter will be placed in your bladder to drain urine during  the procedure. ?An incision will be made in your vagina and on the lower part of your abdomen. ?The sling material will be passed around your bladder neck and stitched (sutured) to the muscles to hold the urethra in its normal position. ?The incisions will then be closed with sutures. ?The procedure may vary among health care providers and hospitals. ?What happens after the procedure? ?Your blood pressure, heart rate, breathing rate, and blood oxygen level will be monitored until you leave the hospital or clinic. ?You will have a catheter in place to drain your bladder. This will stay in place until your bladder is working properly on its own. ?You may have a gauze packing in the vagina to prevent bleeding. This will be removed in 1-2  days. ?Summary ?A urethral vaginal sling procedure is surgery to treat urinary incontinence. ?In this surgery, a strong piece of material is placed under the urethra to hold it in its normal position. ?Follow inst

## 2021-07-03 NOTE — Progress Notes (Signed)
GYNECOLOGY  VISIT ?  ?HPI: ?66 y.o.   Married  Caucasian  female   ?G2P2 with Patient's last menstrual period was 04/15/2008.   ?here for pessary fitting.   ? ?She is interested in a pessary to treat stress incontinence.  ? ?GYNECOLOGIC HISTORY: ?Patient's last menstrual period was 04/15/2008. ?Contraception:  PMP ?Menopausal hormone therapy:   estradiol vaginal cream.  ?Last mammogram:   09-29-20 Neg/BiRads1 ?Last pap smear:    06-17-18 Neg:Neg HR HPV, 05-29-15 Neg:Neg HR HPV, 04-15-12 normal per patient ?       ?OB History   ? ? Gravida  ?2  ? Para  ?2  ? Term  ?   ? Preterm  ?   ? AB  ?   ? Living  ?2  ?  ? ? SAB  ?   ? IAB  ?   ? Ectopic  ?   ? Multiple  ?   ? Live Births  ?   ?   ?  ?  ?    ? ?Patient Active Problem List  ? Diagnosis Date Noted  ? Urinary frequency 01/05/2018  ? Overactive bladder 06/26/2017  ? Pelvic pain 06/26/2017  ? Estrogen deficiency 11/17/2016  ? Sun-damaged skin 11/17/2016  ? Low back pain 11/17/2016  ? Sacroiliac dysfunction 11/12/2016  ? Osteopenia 11/12/2016  ? Insomnia 08/15/2015  ? Hyperlipidemia, mixed 08/21/2014  ? Headache 06/07/2014  ? Preventative health care 02/07/2013  ? Hx: UTI (urinary tract infection) 02/07/2013  ? Osteoarthritis of both knees 02/07/2013  ? 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 ? ?Past Medical History:  ?Diagnosis Date  ? Arthritis of right knee 02/07/2013  ? Chicken pox as a child  ? COVID-19 virus infection 03/2019, 05/2020  ? Headache 06/07/2014  ? History of dental surgery 05/2019  ? Hx: UTI (urinary tract infection) 02/07/2013  ? Hyperlipidemia, mixed 08/21/2014  ? Insomnia 08/15/2015  ? Low back pain 11/17/2016  ? Measles as a child  ? Mumps as a child  ? Osteoarthritis of both knees 02/07/2013  ? Osteopenia 11/12/2016  ? Preventative health care 02/07/2013  ? Sacroiliac dysfunction 11/12/2016  ? TMJ syndrome   ? UTI (urinary tract infection)   ? ? ?Past Surgical History:  ?Procedure Laterality Date  ? APPENDECTOMY  3 rd grade  ? MOLE REMOVAL    ? benign- mole on  right thigh  ? scar tissue break up  2006  ? Dr Alfred Levins  ? WISDOM TOOTH EXTRACTION  as a teenager  ? ? ?Current Outpatient Medications  ?Medication Sig Dispense Refill  ? Calcium Carbonate-Vitamin D 600-400 MG-UNIT tablet Take 1 tablet by mouth daily.    ? Cholecalciferol (VITAMIN D3) 125 MCG (5000 UT) CAPS Take by mouth.    ? estradiol (ESTRACE VAGINAL) 0.1 MG/GM vaginal cream Place 1/2 gram per vagina at hs two to three times per week. 42.5 g 1  ? LIDODERM 5 % Place 1 patch onto the skin every 12 (twelve) hours as needed. Pt applies as needed 30 patch 2  ? Magnesium 100 MG CAPS Take by mouth.    ? Omega-3 Fatty Acids (FISH OIL) 1200 MG CAPS Take 1 capsule by mouth daily.    ? Multiple Vitamins-Minerals (PRESERVISION AREDS 2 PO) Take by mouth. (Patient not taking: Reported on 07/04/2021)    ? ?No current facility-administered medications for this visit.  ?  ? ?ALLERGIES: Penicillins and Tetracyclines & related ? ?Family History  ?Problem Relation Age of  Onset  ? Osteoporosis Mother   ? Diabetes Mother   ?     type 2  ? Cancer Mother 58  ?     stomach  ? Colon polyps Mother   ?     benign  ? Colon cancer Mother 85  ?     Tumor in colon removed Stage 2  ? Glaucoma Mother   ? Macular degeneration Mother   ? Hypertension Father   ? Hyperlipidemia Father   ? Other Sister   ?     herniated disk in back, low blood pressure  ? Colon cancer Sister   ? Hernia Brother   ?     X 2  ? ADD / ADHD Brother   ? Hypertension Sister   ? Depression Sister   ? Cancer Maternal Grandmother   ?     ? lung vs breast  ? Cancer Maternal Aunt   ? COPD Paternal Aunt   ? Dementia Paternal Aunt   ? Cancer Paternal Uncle   ? ? ?Social History  ? ?Socioeconomic History  ? Marital status: Married  ?  Spouse name: Not on file  ? Number of children: Not on file  ? Years of education: Not on file  ? Highest education level: Not on file  ?Occupational History  ? Not on file  ?Tobacco Use  ? Smoking status: Never  ? Smokeless tobacco: Never   ?Vaping Use  ? Vaping Use: Never used  ?Substance and Sexual Activity  ? Alcohol use: Yes  ?  Alcohol/week: 1.0 standard drink  ?  Types: 1 Standard drinks or equivalent per week  ?  Comment: occasional beer  2-3/month  ? Drug use: No  ? Sexual activity: Yes  ?  Partners: Male  ?  Birth control/protection: Post-menopausal  ?  Comment: lives with husband, exercising regularly, no dietary restrictions  ?Other Topics Concern  ? Not on file  ?Social History Narrative  ? Not on file  ? ?Social Determinants of Health  ? ?Financial Resource Strain: Not on file  ?Food Insecurity: Not on file  ?Transportation Needs: Not on file  ?Physical Activity: Not on file  ?Stress: Not on file  ?Social Connections: Not on file  ?Intimate Partner Violence: Not on file  ? ? ?Review of Systems  See HPI. ? ?PHYSICAL EXAMINATION:   ? ?BP 124/80 (BP Location: Left Arm, Patient Position: Sitting, Cuff Size: Normal)   LMP 04/15/2008     ?General appearance: alert, cooperative and appears stated age ?  ?Pelvic: External genitalia:  no lesions ?             Urethra:  normal appearing urethra with no masses, tenderness or lesions ?             Bartholins and Skenes: normal    ?             Vagina: normal appearing vagina with normal color and discharge, no lesions ?             Cervix: no lesions ?               ?Bimanual Exam:  Uterus:  normal size, contour, position, consistency, mobility, non-tender ?             Adnexa: no mass, fullness, tenderness ?         ? ?Chaperone was present for exam:  Lovena Le.   CMA ? ?ASSESSMENT ? ?Stress incontinence.   ? ?PLAN ? ?#  2 ring with support comfortable but leaked with it.  ?#3 incontinence dish too large and not able to place this vaginally.  ? ?Will order an incontinence dish in the diameter of a #2 ring with support.  ?  ?An After Visit Summary was printed and given to the patient. ? ?21 min  total time was spent for this patient encounter, including preparation, face-to-face counseling with the  patient, coordination of care, and documentation of the encounter. ? ? ?  ? ?

## 2021-07-04 ENCOUNTER — Other Ambulatory Visit: Payer: Self-pay

## 2021-07-04 ENCOUNTER — Ambulatory Visit: Payer: No Typology Code available for payment source | Admitting: Obstetrics and Gynecology

## 2021-07-04 VITALS — BP 124/80

## 2021-07-04 DIAGNOSIS — N393 Stress incontinence (female) (male): Secondary | ICD-10-CM

## 2021-07-10 ENCOUNTER — Telehealth: Payer: Self-pay | Admitting: Obstetrics and Gynecology

## 2021-07-10 ENCOUNTER — Encounter: Payer: Self-pay | Admitting: Obstetrics and Gynecology

## 2021-07-10 NOTE — Telephone Encounter (Signed)
Please order an incontinence dish for my patient.  ?She has stress incontinence.  ? ?The diameter will be the same as for a Milex #2 ring with support.  ?

## 2021-07-11 NOTE — Telephone Encounter (Signed)
Please order the #1 incontinence dish with support, 55 mm.  ? ?Thank you.  ?

## 2021-07-11 NOTE — Telephone Encounter (Signed)
Incontinence dish with support please.  ?

## 2021-07-11 NOTE — Telephone Encounter (Signed)
Dr. Quincy Simmonds -please confirm incontinence dish with or without support?   ?

## 2021-07-11 NOTE — Telephone Encounter (Addendum)
Ring with support is 57 mm.   Incontinence dish with support #1 is 55 mm and #2 is 60 mm, which would you like me to order?

## 2021-07-12 NOTE — Telephone Encounter (Signed)
Pessary order placed.  ?

## 2021-07-24 ENCOUNTER — Encounter: Payer: Self-pay | Admitting: Sports Medicine

## 2021-07-24 ENCOUNTER — Ambulatory Visit: Payer: No Typology Code available for payment source | Admitting: Sports Medicine

## 2021-07-24 DIAGNOSIS — M79671 Pain in right foot: Secondary | ICD-10-CM | POA: Diagnosis not present

## 2021-07-24 DIAGNOSIS — L84 Corns and callosities: Secondary | ICD-10-CM | POA: Diagnosis not present

## 2021-07-24 DIAGNOSIS — M21619 Bunion of unspecified foot: Secondary | ICD-10-CM | POA: Diagnosis not present

## 2021-07-24 DIAGNOSIS — M2041 Other hammer toe(s) (acquired), right foot: Secondary | ICD-10-CM | POA: Diagnosis not present

## 2021-07-24 DIAGNOSIS — M79672 Pain in left foot: Secondary | ICD-10-CM

## 2021-07-24 DIAGNOSIS — M2042 Other hammer toe(s) (acquired), left foot: Secondary | ICD-10-CM

## 2021-07-24 DIAGNOSIS — M79675 Pain in left toe(s): Secondary | ICD-10-CM

## 2021-07-24 DIAGNOSIS — M79674 Pain in right toe(s): Secondary | ICD-10-CM

## 2021-07-24 MED ORDER — NEOMYCIN-POLYMYXIN-HC 3.5-10000-1 OT SOLN: OTIC | 0 refills | Status: DC

## 2021-07-24 NOTE — Progress Notes (Signed)
Subjective: ?Ellen Morrow is a 66 y.o. female patient who presents to office for evaluation of  Left foot pain secondary to callus skin on the ball of  the foot and soreness at big toenails thinks shoes rub. Patient denies any other pedal complaints.  ? ?Patient Active Problem List  ? Diagnosis Date Noted  ? Urinary frequency 01/05/2018  ? Overactive bladder 06/26/2017  ? Pelvic pain 06/26/2017  ? Estrogen deficiency 11/17/2016  ? Sun-damaged skin 11/17/2016  ? Low back pain 11/17/2016  ? Sacroiliac dysfunction 11/12/2016  ? Osteopenia 11/12/2016  ? Insomnia 08/15/2015  ? Hyperlipidemia, mixed 08/21/2014  ? Headache 06/07/2014  ? Preventative health care 02/07/2013  ? Hx: UTI (urinary tract infection) 02/07/2013  ? Osteoarthritis of both knees 02/07/2013  ? 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 ? ?Current Outpatient Medications on File Prior to Visit  ?Medication Sig Dispense Refill  ? Calcium Carbonate-Vitamin D 600-400 MG-UNIT tablet Take 1 tablet by mouth daily.    ? Cholecalciferol (VITAMIN D3) 125 MCG (5000 UT) CAPS Take by mouth.    ? estradiol (ESTRACE VAGINAL) 0.1 MG/GM vaginal cream Place 1/2 gram per vagina at hs two to three times per week. 42.5 g 1  ? Krill Oil 1000 MG CAPS SMARTSIG:1 By Mouth    ? LIDODERM 5 % Place 1 patch onto the skin every 12 (twelve) hours as needed. Pt applies as needed 30 patch 2  ? Magnesium 100 MG CAPS Take by mouth.    ? Omega-3 Fatty Acids (FISH OIL) 1200 MG CAPS Take 1 capsule by mouth daily.    ? ?No current facility-administered medications on file prior to visit.  ? ? ?Allergies  ?Allergen Reactions  ? Penicillins Rash  ? Tetracyclines & Related Rash  ? ? ?Objective:  ?General: Alert and oriented x3 in no acute distress ? ?Dermatology: Keratotic lesion present submet 2 on left with skin lines transversing the lesion, pain is present with direct pressure to the lesion with a central nucleated core noted, no webspace macerations, no ecchymosis bilateral, all nails x 10 are  well manicured upon closer evaluation of the right and left hallux medial nail fold, there is no obvious ingrowing but there was a small area of reactive keratosis at the medial nail margins there is no significant erythema, no edema, no active drainage. ? ?Vascular: Dorsalis Pedis and Posterior Tibial pedal pulses 2/4, Capillary Fill Time 3 seconds, + pedal hair growth bilateral, no edema bilateral lower extremities, Temperature gradient within normal limits. ? ?Neurology: Gross sensation intact via light touch bilateral. ? ?Musculoskeletal: Mild tenderness with palpation at the keratotic lesion site on the ball of the left foot, unchanged significant bunion and hammertoe noted bilateral ? ?Assessment and Plan: ?Problem List Items Addressed This Visit   ?None ?Visit Diagnoses   ? ? Callus of foot    -  Primary  ? Bunion      ? Hammer toes of both feet      ? Foot pain, bilateral      ? Pain around toenail, right foot      ? Pain around toenail, left foot      ? ?  ? ? ?-Complete examination performed ?-Discussed treatment options ?-Parred keratoic lesion using a chisel blade x1 at the ball of the left foot; treated the area withSalinocaine covered with Band-Aid ?-Encouraged daily skin emollients provided a sample of foot miracle cream to use as directed ?-Advised patient that likely pain around the toenails  may be the result of her bunions getting worse and more pressure at the first toes when she is in shoes ?-Using a sterile nail nipper debrided the right and left hallux toenail at the medial margins with no acute ingrowing noted ?-Prescribed Corticosporin solution to use as needed for any residual soreness at the big toes if follow-up for mixture as I recommend does not work to help soften up nail cuticles.   ?-Recommend good supportive shoes daily for foot type that do not crowd or rub toes and dispensed out of courtesy a toe cap for patient to use at left third toe to prevent this hammertoe from  rubbing ?Patient to return to office as needed or sooner if condition worsens. ? ?Landis Martins, DPM ? ?

## 2021-07-31 ENCOUNTER — Ambulatory Visit: Payer: No Typology Code available for payment source | Admitting: Sports Medicine

## 2021-07-31 ENCOUNTER — Ambulatory Visit
Admission: RE | Admit: 2021-07-31 | Discharge: 2021-07-31 | Disposition: A | Discharge status: Home / Self Care | Payer: No Typology Code available for payment source | Source: Ambulatory Visit | Attending: Sports Medicine | Admitting: Sports Medicine

## 2021-07-31 VITALS — BP 126/80 | Ht 65.5 in | Wt 148.0 lb

## 2021-07-31 DIAGNOSIS — M25561 Pain in right knee: Secondary | ICD-10-CM

## 2021-07-31 DIAGNOSIS — M25562 Pain in left knee: Secondary | ICD-10-CM | POA: Diagnosis not present

## 2021-07-31 DIAGNOSIS — G8929 Other chronic pain: Secondary | ICD-10-CM

## 2021-07-31 MED ORDER — MELOXICAM 15 MG PO TABS: ORAL_TABLET | ORAL | 0 refills | Status: DC

## 2021-08-01 NOTE — Progress Notes (Addendum)
? ?Subjective:  ? ? Patient ID: Ellen Morrow, female    DOB: February 23, 1956, 66 y.o.   MRN: [id number] ? ?HPI chief complaint: Bilateral knee pain ? ?Very pleasant 66 year old female presents today complaining of bilateral knee pain.  She initially made the appointment for her right knee but since then her left knee has started hurting and is now more symptomatic than the right.  Right knee pain began around April of last year.  At that time she was having both knee and hip pain.  She sought some chiropractic treatment and this resolved her hip pain but her knee pain has persisted.  It is primarily along the lateral knee.  She enjoys running a couple times a week and really does not have much pain with running.  Afterwards is when she experiences the most discomfort.  Most of her discomfort occurs with sitting and driving.  Also pain at night when sleeping.  She had dry needling in the past which was somewhat helpful.  She does take 400 mg of Motrin on occasion and finds this also to be helpful.  Today her right knee seems to be improving but her left knee pain, which began about 3 weeks ago, is worse.  She endorses stiffness and decreased range of motion as well as pain in the knee.  Symptoms began after she began introducing some backwards walking upstairs to her exercise routine.  No mechanical symptoms.  She does not recall any significant problems with either knee in the past although she did have x-rays of the right knee done in 2014 which showed only minimal degenerative changes.  No prior knee surgeries. ? ?Past medical history reviewed ?Medications reviewed ?Allergies reviewed ? ? ? ?Review of Systems ?As above ?   ?Objective:  ? Physical Exam ? ?Well-developed, well-nourished.  No acute distress ? ?Right knee: Full range of motion.  No effusion.  Good alignment.  Slight tenderness to palpation along the lateral joint line but a negative McMurray's.  Negative Thessaly's.  No tenderness to palpation along the  medial joint line.  1+ patellofemoral crepitus.  Knee is stable to ligamentous exam.  Neurovascularly intact distally. ? ?Left knee: Range of motion is 0 to 100 degrees.  Small effusion.  Good alignment.  No tenderness to palpation.  Knee is stable to ligamentous exam.  Trace patellofemoral crepitus.  Neurovascularly intact distally. ? ?Limited bedside ultrasound of the left knee shows no evidence of Baker's cyst.  There is a joint effusion present in the suprapatellar pouch and some mild degenerative changes seen along the medial joint space.  Ultrasound evaluation of the right knee shows no suprapatellar swelling.  There does appear to be some degenerative changes along the lateral joint space. ? ? ?   ?Assessment & Plan:  ? ?Bilateral knee pain, left greater than right, likely secondary to DJD ? ?I would like to get x-rays of both knees.  We will start treating this with a compression sleeve on the left knee to be worn with activity.  We will also try Mobic 15 mg daily for 7 days.  She may then take it as needed afterwards.  She will start isometric quad and hip abductor strengthening exercises and will follow-up with me again in 4 weeks.  We will discuss her x-ray findings at that visit.  She will follow-up sooner if she does not notice any initial improvement in today's treatment.  If that is the case then consider cortisone injection into the left knee. ? ?  This note was dictated using Dragon naturally speaking software and may contain errors in syntax, spelling, or content which have not been identified prior to signing this note.  ? ?Addendum: X-rays reviewed.  There is moderate medial compartmental narrowing bilaterally and moderate patellofemoral DJD with spurring.  Nothing acute. ?

## 2021-08-02 ENCOUNTER — Ambulatory Visit (AMBULATORY_SURGERY_CENTER): Payer: No Typology Code available for payment source

## 2021-08-02 VITALS — Ht 65.5 in | Wt 148.0 lb

## 2021-08-02 DIAGNOSIS — Z800 Family history of malignant neoplasm of digestive organs: Secondary | ICD-10-CM

## 2021-08-02 MED ORDER — NA SULFATE-K SULFATE-MG SULF 17.5-3.13-1.6 GM/177ML PO SOLN: 1.0000 | Freq: Once | ORAL | 0 refills | Status: AC

## 2021-08-02 NOTE — Progress Notes (Signed)
No egg or soy allergy known to patient  ?No issues known to pt with past sedation with any surgeries or procedures---patient reports TMJ is an issue if intubation is needed; ?Patient denies ever being told they had issues or difficulty with intubation  ?No FH of Malignant Hyperthermia ?Pt is not on diet pills ?Pt is not on home 02  ?Pt is not on blood thinners  ?Pt denies issues with constipation at this time; ?No A fib or A flutter ?NO PA's for preps discussed with pt in PV today  ?Discussed with pt there will be an out-of-pocket cost for prep and that varies from $0 to 70 + dollars - pt verbalized understanding  ?Pt instructed to use Singlecare.com or GoodRx for a price reduction on prep  ?PV completed over the phone. Pt verified name, DOB, address and insurance during PV today.  ?Pt mailed instruction packet with copy of consent form to read and not return, and instructions.  ?Pt encouraged to call with questions or issues.  ?If pt has My chart, procedure instructions sent via My Chart  ? ?

## 2021-08-07 ENCOUNTER — Encounter: Payer: Self-pay | Admitting: Gastroenterology

## 2021-08-14 NOTE — Telephone Encounter (Signed)
Message to supplier for update on shipping/availability.  ?

## 2021-08-16 ENCOUNTER — Ambulatory Visit (AMBULATORY_SURGERY_CENTER): Payer: No Typology Code available for payment source | Admitting: Gastroenterology

## 2021-08-16 ENCOUNTER — Encounter: Payer: Self-pay | Admitting: Gastroenterology

## 2021-08-16 VITALS — BP 122/71 | HR 60 | Temp 98.0°F | Resp 15 | Ht 65.5 in | Wt 148.0 lb

## 2021-08-16 DIAGNOSIS — D122 Benign neoplasm of ascending colon: Secondary | ICD-10-CM

## 2021-08-16 DIAGNOSIS — Z800 Family history of malignant neoplasm of digestive organs: Secondary | ICD-10-CM | POA: Diagnosis not present

## 2021-08-16 DIAGNOSIS — K635 Polyp of colon: Secondary | ICD-10-CM

## 2021-08-16 DIAGNOSIS — Z86010 Personal history of colonic polyps: Secondary | ICD-10-CM | POA: Diagnosis not present

## 2021-08-16 MED ORDER — SODIUM CHLORIDE 0.9 % IV SOLN: 500.0000 mL | Freq: Once | INTRAVENOUS | Status: DC

## 2021-08-16 NOTE — Op Note (Signed)
[facility] ?Patient Name: Ellen Morrow ?Procedure Date: 08/16/2021 11:44 AM ?MRN: [id number] ?Endoscopist: Mauri Pole , MD ?Age: 66 ?Referring MD:  ?Date of Birth: 06/28/1955 ?Gender: Female ?Account #: [id number] ?Procedure:                Colonoscopy ?Indications:              Screening in patient at increased risk: Family  ?                          history of 1st-degree relative with colorectal  ?                          cancer before age 8 years ?Medicines:                Monitored Anesthesia Care ?Procedure:                Pre-Anesthesia Assessment: ?                          - Prior to the procedure, a History and Physical  ?                          was performed, and patient medications and  ?                          allergies were reviewed. The patient's tolerance of  ?                          previous anesthesia was also reviewed. The risks  ?                          and benefits of the procedure and the sedation  ?                          options and risks were discussed with the patient.  ?                          All questions were answered, and informed consent  ?                          was obtained. Prior Anticoagulants: The patient has  ?                          taken no previous anticoagulant or antiplatelet  ?                          agents. ASA Grade Assessment: II - A patient with  ?                          mild systemic disease. After reviewing the risks  ?                          and benefits, the patient was deemed in  ?  satisfactory condition to undergo the procedure. ?                          After obtaining informed consent, the colonoscope  ?                          was passed under direct vision. Throughout the  ?                          procedure, the patient's blood pressure, pulse, and  ?                          oxygen saturations were monitored continuously. The  ?                          Olympus PCF-H190DL (#[id number])  Colonoscope was  ?                          introduced through the anus and advanced to the the  ?                          cecum, identified by appendiceal orifice and  ?                          ileocecal valve. The colonoscopy was performed  ?                          without difficulty. The patient tolerated the  ?                          procedure well. The quality of the bowel  ?                          preparation was good. The ileocecal valve,  ?                          appendiceal orifice, and rectum were photographed. ?Scope In: 11:50:45 AM ?Scope Out: 12:04:42 PM ?Scope Withdrawal Time: 0 hours 7 minutes 9 seconds  ?Total Procedure Duration: 0 hours 13 minutes 57 seconds  ?Findings:                 The perianal and digital rectal examinations were  ?                          normal. ?                          Three sessile polyps were found in the hepatic  ?                          flexure and ascending colon. The polyps were 5 to  ?                          10 mm in size. These polyps were removed with a  ?  cold snare. Resection and retrieval were complete. ?                          Small and large-mouthed diverticula were found in  ?                          the sigmoid colon and descending colon. ?                          Non-bleeding external and internal hemorrhoids were  ?                          found during retroflexion. The hemorrhoids were  ?                          medium-sized. ?Complications:            No immediate complications. ?Estimated Blood Loss:     Estimated blood loss was minimal. ?Impression:               - Three 5 to 10 mm polyps at the hepatic flexure  ?                          and in the ascending colon, removed with a cold  ?                          snare. Resected and retrieved. ?                          - Diverticulosis in the sigmoid colon and in the  ?                          descending colon. ?                          - Non-bleeding  external and internal hemorrhoids. ?Recommendation:           - Patient has a contact number available for  ?                          emergencies. The signs and symptoms of potential  ?                          delayed complications were discussed with the  ?                          patient. Return to normal activities tomorrow.  ?                          Written discharge instructions were provided to the  ?                          patient. ?                          - Resume previous diet. ?                          -  Continue present medications. ?                          - Await pathology results. ?                          - Repeat colonoscopy in 3 - 5 years for  ?                          surveillance based on pathology results. ?Mauri Pole, MD ?08/16/2021 12:11:54 PM ?This report has been signed electronically. ?

## 2021-08-16 NOTE — Progress Notes (Signed)
Pt non-responsive, VVS, Report to RN  °

## 2021-08-16 NOTE — Progress Notes (Signed)
Called to room to assist during endoscopic procedure.  Patient ID and intended procedure confirmed with present staff. Received instructions for my participation in the procedure from the performing physician.  

## 2021-08-16 NOTE — Patient Instructions (Signed)
Handout on polyps, diverticulosis and hemorrhoids provided  ? ?Await pathology results.  ? ?Continue current medications.  ? ?YOU HAD AN ENDOSCOPIC PROCEDURE TODAY AT [facility]:   Refer to the procedure report that was given to you for any specific questions about what was found during the examination.  If the procedure report does not answer your questions, please call your gastroenterologist to clarify.  If you requested that your care partner not be given the details of your procedure findings, then the procedure report has been included in a sealed envelope for you to review at your convenience later. ? ?YOU SHOULD EXPECT: Some feelings of bloating in the abdomen. Passage of more gas than usual.  Walking can help get rid of the air that was put into your GI tract during the procedure and reduce the bloating. If you had a lower endoscopy (such as a colonoscopy or flexible sigmoidoscopy) you may notice spotting of blood in your stool or on the toilet paper. If you underwent a bowel prep for your procedure, you may not have a normal bowel movement for a few days. ? ?Please Note:  You might notice some irritation and congestion in your nose or some drainage.  This is from the oxygen used during your procedure.  There is no need for concern and it should clear up in a day or so. ? ?SYMPTOMS TO REPORT IMMEDIATELY: ? ?Following lower endoscopy (colonoscopy or flexible sigmoidoscopy): ? Excessive amounts of blood in the stool ? Significant tenderness or worsening of abdominal pains ? Swelling of the abdomen that is new, acute ? Fever of 100?F or higher ? ? ?For urgent or emergent issues, a gastroenterologist can be reached at any hour by calling [phone]. ?Do not use MyChart messaging for urgent concerns.  ? ? ?DIET:  We do recommend a small meal at first, but then you may proceed to your regular diet.  Drink plenty of fluids but you should avoid alcoholic beverages for 24 hours. ? ?ACTIVITY:   You should plan to take it easy for the rest of today and you should NOT DRIVE or use heavy machinery until tomorrow (because of the sedation medicines used during the test).   ? ?FOLLOW UP: ?Our staff will call the number listed on your records 48-72 hours following your procedure to check on you and address any questions or concerns that you may have regarding the information given to you following your procedure. If we do not reach you, we will leave a message.  We will attempt to reach you two times.  During this call, we will ask if you have developed any symptoms of COVID 19. If you develop any symptoms (ie: fever, flu-like symptoms, shortness of breath, cough etc.) before then, please call [phone].  If you test positive for Covid 19 in the 2 weeks post procedure, please call and report this information to [place].   ? ?If any biopsies were taken you will be contacted by phone or by letter within the next 1-3 weeks.  Please call us at [phone] if you have not heard about the biopsies in 3 weeks.  ? ? ?SIGNATURES/CONFIDENTIALITY: ?You and/or your care partner have signed paperwork which will be entered into your electronic medical record.  These signatures attest to the fact that that the information above on your After Visit Summary has been reviewed and is understood.  Full responsibility of the confidentiality of this discharge information lies with you and/or your care-partner. ? ? ?

## 2021-08-16 NOTE — Progress Notes (Signed)
[facility] History and Physical ? ? ?Primary Care Physician:  Glenis Smoker, MD ? ? ?Reason for Procedure:  Family history of colon cancer ? ?Plan:    Screening colonoscopy with possible interventions as needed ? ? ? ? ?HPI: Ellen Morrow is a very pleasant 66 y.o. female here for screening colonoscopy. ?Denies any nausea, vomiting, abdominal pain, melena or bright red blood per rectum ? ?The risks and benefits as well as alternatives of endoscopic procedure(s) have been discussed and reviewed. All questions answered. The patient agrees to proceed. ? ? ? ?Past Medical History:  ?Diagnosis Date  ? Arthritis of right knee 02/07/2013  ? Chicken pox as a child  ? COVID-19 virus infection 03/2019, 05/2020  ? Headache 06/07/2014  ? History of dental surgery 05/2019  ? Hx: UTI (urinary tract infection) 02/07/2013  ? Hyperlipidemia, mixed 08/21/2014  ? Insomnia 08/15/2015  ? Low back pain 11/17/2016  ? Measles as a child  ? Mumps as a child  ? Osteoarthritis of both knees 02/07/2013  ? Osteopenia 11/12/2016  ? Preventative health care 02/07/2013  ? Sacroiliac dysfunction 11/12/2016  ? TMJ syndrome   ? UTI (urinary tract infection)   ? ? ?Past Surgical History:  ?Procedure Laterality Date  ? APPENDECTOMY  3 rd grade  ? COLONOSCOPY  2018  ? KN-MAC-prep exc-int hems  ? DENTAL SURGERY  2021  ? MOLE REMOVAL    ? benign- mole on right thigh  ? scar tissue break up  04/15/2004  ? Dr Alfred Levins  ? WISDOM TOOTH EXTRACTION  as a teenager  ? ? ?Prior to Admission medications   ?Medication Sig Start Date End Date Taking? Authorizing Provider  ?Calcium Carbonate-Vitamin D 600-400 MG-UNIT tablet Take 1 tablet by mouth daily.   Yes [provider]  ?Cholecalciferol (VITAMIN D3) 125 MCG (5000 UT) CAPS Take 1 capsule by mouth daily at 6 (six) AM.   Yes [provider]  ?estradiol (ESTRACE VAGINAL) 0.1 MG/GM vaginal cream Place 1/2 gram per vagina at hs two to three times per week. ?Patient taking  differently: 3 (three) times a week. Place 1/2 gram per vagina at hs two to three times per week. 06/28/21  Yes Nunzio Cobbs, MD  ?Magnesium 100 MG CAPS Take by mouth.   Yes [provider]  ?Omega-3 Fatty Acids (FISH OIL) 1200 MG CAPS Take 1 capsule by mouth daily.   Yes [provider]  ?LIDODERM 5 % Place 1 patch onto the skin every 12 (twelve) hours as needed. Pt applies as needed 11/12/16   Mosie Lukes, MD  ?meloxicam (MOBIC) 15 MG tablet Take 1 tablet daily with food for 7 days. Then take as needed. ?Patient not taking: Reported on 08/16/2021 07/31/21   Thurman Coyer, DO  ?neomycin-polymyxin-hydrocortisone (CORTISPORIN) OTIC solution Apply 1 drop to each big toe as needed after bath or shower 07/24/21   Landis Martins, DPM  ? ? ?Current Outpatient Medications  ?Medication Sig Dispense Refill  ? Calcium Carbonate-Vitamin D 600-400 MG-UNIT tablet Take 1 tablet by mouth daily.    ? Cholecalciferol (VITAMIN D3) 125 MCG (5000 UT) CAPS Take 1 capsule by mouth daily at 6 (six) AM.    ? estradiol (ESTRACE VAGINAL) 0.1 MG/GM vaginal cream Place 1/2 gram per vagina at hs two to three times per week. (Patient taking differently: 3 (three) times a week. Place 1/2 gram per vagina at hs two to three times per week.) 42.5 g 1  ?  Magnesium 100 MG CAPS Take by mouth.    ? Omega-3 Fatty Acids (FISH OIL) 1200 MG CAPS Take 1 capsule by mouth daily.    ? LIDODERM 5 % Place 1 patch onto the skin every 12 (twelve) hours as needed. Pt applies as needed 30 patch 2  ? meloxicam (MOBIC) 15 MG tablet Take 1 tablet daily with food for 7 days. Then take as needed. (Patient not taking: Reported on 08/16/2021) 40 tablet 0  ? neomycin-polymyxin-hydrocortisone (CORTISPORIN) OTIC solution Apply 1 drop to each big toe as needed after bath or shower 10 mL 0  ? ?Current Facility-Administered Medications  ?Medication Dose Route Frequency Provider Last Rate Last Admin  ? 0.9 %  sodium chloride infusion  500 mL  Intravenous Once Lawerence Dery, Venia Minks, MD      ? ? ?Allergies as of 08/16/2021 - Review Complete 08/16/2021  ?Allergen Reaction Noted  ? Penicillins Rash 08/07/2012  ? Tetracyclines & related Rash 06/26/2020  ? ? ?Family History  ?Problem Relation Age of Onset  ? Stomach cancer Mother 38  ? Osteoporosis Mother   ? Diabetes Mother   ?     type 2  ? Cancer Mother 46  ?     stomach  ? Colon polyps Mother 35  ?     benign  ? Colon cancer Mother 25  ?     Tumor in colon removed Stage 2  ? Glaucoma Mother   ? Macular degeneration Mother   ? Hypertension Father   ? Hyperlipidemia Father   ? Colon polyps Sister   ? Other Sister   ?     herniated disk in back, low blood pressure  ? Colon polyps Sister   ? Hypertension Sister   ? Depression Sister   ? Hernia Brother   ?     X 2  ? ADD / ADHD Brother   ? Cancer Maternal Aunt   ? COPD Paternal Aunt   ? Dementia Paternal Aunt   ? Cancer Paternal Uncle   ? Cancer Maternal Grandmother   ?     ? lung vs breast  ? Esophageal cancer Neg Hx   ? Rectal cancer Neg Hx   ? Prostate cancer Neg Hx   ? ? ?Social History  ? ?Socioeconomic History  ? Marital status: Married  ?  Spouse name: Not on file  ? Number of children: Not on file  ? Years of education: Not on file  ? Highest education level: Not on file  ?Occupational History  ? Not on file  ?Tobacco Use  ? Smoking status: Never  ? Smokeless tobacco: Never  ?Vaping Use  ? Vaping Use: Never used  ?Substance and Sexual Activity  ? Alcohol use: Not Currently  ?  Alcohol/week: 0.0 - 2.0 standard drinks  ?  Comment: occasional beer  2-3/month  ? Drug use: No  ? Sexual activity: Yes  ?  Partners: Male  ?  Birth control/protection: Post-menopausal  ?  Comment: lives with husband, exercising regularly, no dietary restrictions  ?Other Topics Concern  ? Not on file  ?Social History Narrative  ? Not on file  ? ?Social Determinants of Health  ? ?Financial Resource Strain: Not on file  ?Food Insecurity: Not on file  ?Transportation Needs: Not on file   ?Physical Activity: Not on file  ?Stress: Not on file  ?Social Connections: Not on file  ?Intimate Partner Violence: Not on file  ? ? ?Review of Systems: ? ?All  other review of systems negative except as mentioned in the HPI. ? ?Physical Exam: ?Vital signs in last 24 hours: ?BP (!) 89/52   Pulse 67   Temp 98 ?F (36.7 ?C)   Ht 5' 5.5" (1.664 m)   Wt 148 lb (67.1 kg)   LMP 04/15/2008   SpO2 98%   BMI 24.25 kg/m?  ?General:   Alert, NAD ?Lungs:  Clear .   ?Heart:  Regular rate and rhythm ?Abdomen:  Soft, nontender and nondistended. ?Neuro/Psych:  Alert and cooperative. Normal mood and affect. A and O x 3 ? ?Reviewed labs, radiology imaging, old records and pertinent past GI work up ? ?Patient is appropriate for planned procedure(s) and anesthesia in an ambulatory setting ? ? ?K. Denzil Magnuson , MD ?[phone]  ? ? ?  ?

## 2021-08-20 ENCOUNTER — Other Ambulatory Visit (HOSPITAL_COMMUNITY): Payer: Self-pay | Admitting: Family Medicine

## 2021-08-20 ENCOUNTER — Telehealth: Payer: Self-pay | Admitting: *Deleted

## 2021-08-20 DIAGNOSIS — E785 Hyperlipidemia, unspecified: Secondary | ICD-10-CM

## 2021-08-20 NOTE — Telephone Encounter (Signed)
?  Follow up Call- ? ? ?  08/16/2021  ? 10:56 AM  ?Call back number  ?Post procedure Call Back phone  # [phone]  ?Permission to leave phone message Yes  ?  ? ?Patient questions: ? ?Do you have a fever, pain , or abdominal swelling? No. ?Pain Score  0 * ? ?Have you tolerated food without any problems? Yes.   ? ?Have you been able to return to your normal activities? Yes.   ? ?Do you have any questions about your discharge instructions: ?Diet   No. ?Medications  No. ?Follow up visit  No. ? ?Do you have questions or concerns about your Care? No. ? ?Actions: ?* If pain score is 4 or above: ?No action needed, pain <4. ? ? ?

## 2021-08-24 ENCOUNTER — Encounter: Payer: Self-pay | Admitting: Gastroenterology

## 2021-08-27 ENCOUNTER — Other Ambulatory Visit: Payer: Self-pay | Admitting: Sports Medicine

## 2021-08-28 ENCOUNTER — Ambulatory Visit (INDEPENDENT_AMBULATORY_CARE_PROVIDER_SITE_OTHER): Payer: Self-pay

## 2021-08-28 ENCOUNTER — Ambulatory Visit: Payer: No Typology Code available for payment source | Admitting: Sports Medicine

## 2021-08-28 VITALS — BP 118/84 | Ht 65.5 in | Wt 148.0 lb

## 2021-08-28 DIAGNOSIS — E785 Hyperlipidemia, unspecified: Secondary | ICD-10-CM

## 2021-08-28 DIAGNOSIS — M170 Bilateral primary osteoarthritis of knee: Secondary | ICD-10-CM | POA: Diagnosis not present

## 2021-08-28 NOTE — Progress Notes (Addendum)
Patient ID: GALIT URICH, female   DOB: 02-Aug-1955, 66 y.o.   MRN: [id number] ? ?Sagal presents today for follow-up.  Her knees are feeling better.  She did notice some benefit from the compression sleeve and 7 days worth of meloxicam.  She has started working with a Physiological scientist and that seems to be going well.  She also has a trip to [place] planned in July.  Physical exam was not repeated today.  We simply talked about treatment for her bilateral knee osteoarthritis going forward.  I reviewed her x-rays with her.  She has moderate degenerative changes at both the patellofemoral and medial tibiofemoral joints.  I discussed with her the types of exercises that are safe to do with her personal trainer.  I also encouraged her to continue with her compression sleeve with exercise.  I recommended that she avoid high impact activities such as running until she returns from [place].  She may also continue to take her meloxicam as needed.  She will follow-up with me for ongoing or recalcitrant issues. ? ?This note was dictated using Dragon naturally speaking software and may contain errors in syntax, spelling, or content which have not been identified prior to signing this note.  ?

## 2021-08-28 NOTE — Patient Instructions (Signed)
?  I recommend that you avoid any sort of lower body exercise that involves a squat past 90 degrees and I also recommend that you avoid repetitive overhead lifting and pulling.  Those types of activities can injure your rotator cuff.  Your personal trainer should be able to give you plenty of other exercises to do to work the major muscle groups of your upper body. ? ?I hope you have an enjoyable trip to [place]! ?

## 2021-09-11 NOTE — Progress Notes (Shared)
[facility] Note  09/25/2021     CHIEF COMPLAINT Patient presents for No chief complaint on file.    HISTORY OF PRESENT ILLNESS: Ellen Morrow is a 66 y.o. female who presents to the clinic today for:    Patient saw Dr. Genevie Ann at [facility] and got a stronger glasses rx, no other eye problems, no new fol or floaters  Referring physician: Ok Edwards, OD [address] [place], [place] [address]  HISTORICAL INFORMATION:   Selected notes from the MEDICAL RECORD NUMBER Referred by Dr. Thom Chimes for concern of Drusen OU   CURRENT MEDICATIONS: No current outpatient medications on file. (Ophthalmic Drugs)   No current facility-administered medications for this visit. (Ophthalmic Drugs)   Current Outpatient Medications (Other)  Medication Sig   Calcium Carbonate-Vitamin D 600-400 MG-UNIT tablet Take 1 tablet by mouth daily.   Cholecalciferol (VITAMIN D3) 125 MCG (5000 UT) CAPS Take 1 capsule by mouth daily at 6 (six) AM.   estradiol (ESTRACE VAGINAL) 0.1 MG/GM vaginal cream Place 1/2 gram per vagina at hs two to three times per week. (Patient taking differently: 3 (three) times a week. Place 1/2 gram per vagina at hs two to three times per week.)   LIDODERM 5 % Place 1 patch onto the skin every 12 (twelve) hours as needed. Pt applies as needed   Magnesium 100 MG CAPS Take by mouth.   meloxicam (MOBIC) 15 MG tablet Take 1 tablet daily with food as needed.   neomycin-polymyxin-hydrocortisone (CORTISPORIN) OTIC solution Apply 1 drop to each big toe as needed after bath or shower   Omega-3 Fatty Acids (FISH OIL) 1200 MG CAPS Take 1 capsule by mouth daily.   No current facility-administered medications for this visit. (Other)      REVIEW OF SYSTEMS:     ALLERGIES Allergies  Allergen Reactions   Penicillins Rash   Tetracyclines & Related Rash    PAST MEDICAL HISTORY Past Medical History:  Diagnosis Date   Arthritis of right  knee 02/07/2013   Chicken pox as a child   COVID-19 virus infection 03/2019, 05/2020   Headache 06/07/2014   History of dental surgery 05/2019   Hx: UTI (urinary tract infection) 02/07/2013   Hyperlipidemia, mixed 08/21/2014   Insomnia 08/15/2015   Low back pain 11/17/2016   Measles as a child   Mumps as a child   Osteoarthritis of both knees 02/07/2013   Osteopenia 11/12/2016   Preventative health care 02/07/2013   Sacroiliac dysfunction 11/12/2016   TMJ syndrome    UTI (urinary tract infection)    Past Surgical History:  Procedure Laterality Date   APPENDECTOMY  3 rd grade   COLONOSCOPY  2018   KN-MAC-prep exc-int hems   DENTAL SURGERY  2021   MOLE REMOVAL     benign- mole on right thigh   scar tissue break up  04/15/2004   Dr Alfred Levins   WISDOM TOOTH EXTRACTION  as a teenager    FAMILY HISTORY Family History  Problem Relation Age of Onset   Stomach cancer Mother 38   Osteoporosis Mother    Diabetes Mother        type 2   Cancer Mother 44       stomach   Colon polyps Mother 79       benign   Colon cancer Mother 64       Tumor in colon removed Stage 2   Glaucoma Mother  Macular degeneration Mother    Hypertension Father    Hyperlipidemia Father    Colon polyps Sister    Other Sister        herniated disk in back, low blood pressure   Colon polyps Sister    Hypertension Sister    Depression Sister    Hernia Brother        X 2   ADD / ADHD Brother    Cancer Maternal Aunt    COPD Paternal Aunt    Dementia Paternal Aunt    Cancer Paternal Uncle    Cancer Maternal Grandmother        ? lung vs breast   Esophageal cancer Neg Hx    Rectal cancer Neg Hx    Prostate cancer Neg Hx     SOCIAL HISTORY Social History   Tobacco Use   Smoking status: Never   Smokeless tobacco: Never  Vaping Use   Vaping Use: Never used  Substance Use Topics   Alcohol use: Not Currently    Alcohol/week: 0.0 - 2.0 standard drinks    Comment: occasional beer  2-3/month   Drug  use: No         OPHTHALMIC EXAM:  Not recorded     IMAGING AND PROCEDURES  Imaging and Procedures for _0 @            ASSESSMENT/PLAN:    ICD-10-CM   1. Retinal drusen of both eyes  H35.363     2. Combined forms of age-related cataract of both eyes  H25.813        1. Retina Drusen OU  - midzonal peripheral drusen OU -- not visually significant -- stable  - BCVA 20/20 OU  - pt reports +family history of exudative ARMD in mother who was 76+ yrs old -- treated at [facility]  - discussed findings, prognosis  - monitor  - f/u in 12 months  2. Age related cataracts OU  - The symptoms of cataract, surgical options, and treatments and risks were discussed with patient.  - discussed diagnosis and progression  - not yet visually significant  - monitor    Ophthalmic Meds Ordered this visit:  No orders of the defined types were placed in this encounter.       No follow-ups on file.  There are no Patient Instructions on file for this visit.   Explained the diagnoses, plan, and follow up with the patient and they expressed understanding.  Patient expressed understanding of the importance of proper follow up care.   This document serves as a record of services personally performed by Gardiner Sleeper, MD, PhD. It was created on their behalf by Renaldo Reel, [place] an ophthalmic technician. The creation of this record is the provider's dictation and/or activities during the visit.    Electronically signed by:  Renaldo Reel, COT  09/11/21 10:38 AM    Gardiner Sleeper, M.D., Ph.D. Diseases & Surgery of the Retina and Vitreous [facility]: M myopia (nearsighted); A astigmatism; H hyperopia (farsighted); P presbyopia; Mrx spectacle prescription;  CTL contact lenses; OD right eye; OS left eye; OU both eyes  XT exotropia; ET esotropia; PEK punctate epithelial keratitis; PEE punctate epithelial erosions; DES dry eye  syndrome; MGD meibomian gland dysfunction; ATs artificial tears; PFAT's preservative free artificial tears; [place] nuclear sclerotic cataract; PSC posterior subcapsular cataract; ERM epi-retinal membrane; PVD posterior vitreous detachment; RD retinal detachment; DM diabetes mellitus; DR diabetic retinopathy;  NPDR non-proliferative diabetic retinopathy; PDR proliferative diabetic retinopathy; CSME clinically significant macular edema; DME diabetic macular edema; dbh dot blot hemorrhages; CWS cotton wool spot; POAG primary open angle glaucoma; C/D cup-to-disc ratio; HVF humphrey visual field; GVF goldmann visual field; OCT optical coherence tomography; IOP intraocular pressure; BRVO Branch retinal vein occlusion; CRVO central retinal vein occlusion; CRAO central retinal artery occlusion; BRAO branch retinal artery occlusion; RT retinal tear; SB scleral buckle; PPV pars plana vitrectomy; VH Vitreous hemorrhage; PRP panretinal laser photocoagulation; IVK intravitreal kenalog; VMT vitreomacular traction; MH Macular hole;  NVD neovascularization of the disc; NVE neovascularization elsewhere; AREDS age related eye disease study; ARMD age related macular degeneration; POAG primary open angle glaucoma; EBMD epithelial/anterior basement membrane dystrophy; ACIOL anterior chamber intraocular lens; IOL intraocular lens; PCIOL posterior chamber intraocular lens; Phaco/IOL phacoemulsification with intraocular lens placement; [place] photorefractive keratectomy; LASIK laser assisted in situ keratomileusis; HTN hypertension; DM diabetes mellitus; COPD chronic obstructive pulmonary disease

## 2021-09-12 ENCOUNTER — Other Ambulatory Visit: Payer: Self-pay | Admitting: Sports Medicine

## 2021-09-12 ENCOUNTER — Telehealth: Payer: Self-pay | Admitting: Sports Medicine

## 2021-09-12 MED ORDER — NEOMYCIN-POLYMYXIN-HC 3.5-10000-1 OT SOLN: OTIC | 0 refills | Status: DC

## 2021-09-12 NOTE — Telephone Encounter (Signed)
Pt called asking if you could refill the medication that you gave her at the appt before the last one. I believe it was the Corticosporin. She said you gave her a sample. She would like [facility] sent to [facility] [place] please.

## 2021-09-12 NOTE — Progress Notes (Signed)
Refilled Corticosporin solution to use as directed

## 2021-09-18 NOTE — Telephone Encounter (Signed)
Call to patient, left detailed message, ok per dpr. Advised pessary has been delivered. Please contact the office at [phone], OPT 1, to schedule OV with Dr. Quincy Simmonds for placement.   Pessary to [place], CMA.   Routing to provider for final review. Patient is agreeable to disposition. Will close encounter.

## 2021-09-25 ENCOUNTER — Encounter (INDEPENDENT_AMBULATORY_CARE_PROVIDER_SITE_OTHER): Payer: No Typology Code available for payment source | Admitting: Ophthalmology

## 2021-09-25 DIAGNOSIS — H35363 Drusen (degenerative) of macula, bilateral: Secondary | ICD-10-CM

## 2021-09-25 DIAGNOSIS — H25813 Combined forms of age-related cataract, bilateral: Secondary | ICD-10-CM

## 2021-09-25 NOTE — Progress Notes (Signed)
[facility] Note  10/03/2021     CHIEF COMPLAINT Patient presents for Retina Follow Up   HISTORY OF PRESENT ILLNESS: Ellen Morrow is a 66 y.o. female who presents to the clinic today for:   HPI     Retina Follow Up   Patient presents with  Other.  In both eyes.  This started 1 year ago.  I, the attending physician,  performed the HPI with the patient and updated documentation appropriately.        Comments   Patient here  for 1 year follow up for retinal drusen OU. Patient states vision doing good. No eye pain.       Last edited by Bernarda Caffey, MD on 10/03/2021  1:12 PM.    Pt states vision is doing well, she is still following with Dr. Sherral Hammers at [facility], no new health concerns  Referring physician: Ok Edwards, [place] [address], [place], [place] [address]  HISTORICAL INFORMATION:   Selected notes from the MEDICAL RECORD NUMBER Referred by Dr. Thom Chimes for concern of Drusen OU   CURRENT MEDICATIONS: No current outpatient medications on file. (Ophthalmic Drugs)   No current facility-administered medications for this visit. (Ophthalmic Drugs)   Current Outpatient Medications (Other)  Medication Sig   Calcium Carbonate-Vitamin D 600-400 MG-UNIT tablet Take 1 tablet by mouth daily.   Cholecalciferol (VITAMIN D3) 125 MCG (5000 UT) CAPS Take 1 capsule by mouth daily at 6 (six) AM.   estradiol (ESTRACE VAGINAL) 0.1 MG/GM vaginal cream Place 1/2 gram per vagina at hs two to three times per week. (Patient taking differently: 3 (three) times a week. Place 1/2 gram per vagina at hs two to three times per week.)   LIDODERM 5 % Place 1 patch onto the skin every 12 (twelve) hours as needed. Pt applies as needed   Magnesium 100 MG CAPS Take by mouth.   meloxicam (MOBIC) 15 MG tablet Take 1 tablet daily with food as needed.   neomycin-polymyxin-hydrocortisone (CORTISPORIN) OTIC solution Apply 1 drop to each big toe as needed after bath or  shower   Omega-3 Fatty Acids (FISH OIL) 1200 MG CAPS Take 1 capsule by mouth daily.   No current facility-administered medications for this visit. (Other)   REVIEW OF SYSTEMS: ROS   Positive for: Musculoskeletal, Eyes Negative for: Constitutional, Gastrointestinal, Neurological, Skin, Genitourinary, HENT, Endocrine, Cardiovascular, Respiratory, Psychiatric, Allergic/Imm, Heme/Lymph Last edited by Theodore Demark, COA on 10/03/2021  8:48 AM.     ALLERGIES Allergies  Allergen Reactions   Penicillins Rash   Tetracyclines & Related Rash   PAST MEDICAL HISTORY Past Medical History:  Diagnosis Date   Arthritis of right knee 02/07/2013   Chicken pox as a child   COVID-19 virus infection 03/2019, 05/2020   Headache 06/07/2014   History of dental surgery 05/2019   Hx: UTI (urinary tract infection) 02/07/2013   Hyperlipidemia, mixed 08/21/2014   Insomnia 08/15/2015   Low back pain 11/17/2016   Measles as a child   Mumps as a child   Osteoarthritis of both knees 02/07/2013   Osteopenia 11/12/2016   Preventative health care 02/07/2013   Sacroiliac dysfunction 11/12/2016   TMJ syndrome    UTI (urinary tract infection)    Past Surgical History:  Procedure Laterality Date   APPENDECTOMY  3 rd grade   COLONOSCOPY  2018   KN-MAC-prep exc-int hems   DENTAL SURGERY  2021   MOLE REMOVAL  benign- mole on right thigh   scar tissue break up  04/15/2004   Dr Alfred Levins   WISDOM TOOTH EXTRACTION  as a teenager   FAMILY HISTORY Family History  Problem Relation Age of Onset   Stomach cancer Mother 13   Osteoporosis Mother    Diabetes Mother        type 2   Cancer Mother 30       stomach   Colon polyps Mother 32       benign   Colon cancer Mother 39       Tumor in colon removed Stage 2   Glaucoma Mother    Macular degeneration Mother    Hypertension Father    Hyperlipidemia Father    Colon polyps Sister    Other Sister        herniated disk in back, low blood pressure   Colon  polyps Sister    Hypertension Sister    Depression Sister    Hernia Brother        X 2   ADD / ADHD Brother    Cancer Maternal Aunt    COPD Paternal Aunt    Dementia Paternal Aunt    Cancer Paternal Uncle    Cancer Maternal Grandmother        ? lung vs breast   Esophageal cancer Neg Hx    Rectal cancer Neg Hx    Prostate cancer Neg Hx    SOCIAL HISTORY Social History   Tobacco Use   Smoking status: Never   Smokeless tobacco: Never  Vaping Use   Vaping Use: Never used  Substance Use Topics   Alcohol use: Not Currently    Alcohol/week: 0.0 - 2.0 standard drinks of alcohol    Comment: occasional beer  2-3/month   Drug use: No       OPHTHALMIC EXAM:   Base Eye Exam     Visual Acuity (Snellen - Linear)       Right Left   Dist cc 20/20 -1 20/20    Correction: Glasses         Tonometry (Tonopen, 8:47 AM)       Right Left   Pressure 16 16         Pupils       Dark Light Shape React APD   Right 3 2 Round Brisk None   Left 3 2 Round Brisk None         Visual Fields (Counting fingers)       Left Right    Full Full         Extraocular Movement       Right Left    Full, Ortho Full, Ortho         Neuro/Psych     Oriented x3: Yes   Mood/Affect: Normal         Dilation     Both eyes: 1.0% Mydriacyl, 2.5% Phenylephrine @ 8:47 AM           Slit Lamp and Fundus Exam     Slit Lamp Exam       Right Left   Lids/Lashes Dermatochalasis - upper lid Dermatochalasis - upper lid   Conjunctiva/Sclera mild temporal pinguecula mild temporal pinguecula   Cornea Trace Punctate epithelial erosions, mild arcus, trace tear film debris Trace Punctate epithelial erosions, mild arcus, trace tear film debris   Anterior Chamber deep, narrow temporal angle deep, narrow temporal angle   Iris round and dilated round and  dilated   Lens 2+ NS, 2+cortical cataract 2+ NS, 2+cortical cataract   Anterior Vitreous syneresis syneresis         Fundus Exam        Right Left   Disc pink and sharp, mild inferior PPP pink and sharp, mil dPPA   C/D Ratio 0.6 0.3   Macula Flat, good foveal reflex, mild RPE mottling, no heme or edema, no significant drusen Flat, good foveal reflex, mild RPE mottling and clumping, no heme or edema, no significant drusen   Vessels mild attenuation, mild tortuosity mild attenuation, mild tortuosity   Periphery attached, mid-zonal drusen and reticular degeneration attached, mid-zonal drusen, mild reticular degeneration, focal pigmented chorioretinal scar at 445, no RT or SRF           Refraction     Wearing Rx       Sphere Cylinder Axis   Right +2.00 +0.50 150   Left +2.00 +0.25 165    Type: PAL           IMAGING AND PROCEDURES  Imaging and Procedures for _0 @  OCT, Retina - OU - Both Eyes       Right Eye Quality was good. Central Foveal Thickness: 240. Progression has been stable. Findings include normal foveal contour, no IRF, no SRF, vitreomacular adhesion (Rare drusen concentrated outside arcades -- visible on widefield).   Left Eye Quality was good. Central Foveal Thickness: 254. Progression has been stable. Findings include normal observations, normal foveal contour, no IRF, no SRF, vitreomacular adhesion (Rare drusen concentrated outside arcades -- visible on widefield).   Notes *Images captured and stored on drive  Diagnosis / Impression:  NFP, no IRF/SRF OU, +VMA OU Rare drusen concentrated outside arcades -- visible on widefield OU  Clinical management:  See below  Abbreviations: NFP - Normal foveal profile. CME - cystoid macular edema. PED - pigment epithelial detachment. IRF - intraretinal fluid. SRF - subretinal fluid. EZ - ellipsoid zone. ERM - epiretinal membrane. ORA - outer retinal atrophy. ORT - outer retinal tubulation. SRHM - subretinal hyper-reflective material            ASSESSMENT/PLAN:    ICD-10-CM   1. Retinal drusen of both eyes  H35.363 OCT, Retina - OU -  Both Eyes    2. Combined forms of age-related cataract of both eyes  H25.813      1. Retina Drusen OU  - midzonal peripheral drusen OU -- totally outside macula and not visually significant -- stable since initial visit here in Jan 2021  - BCVA 20/20 OU  - pt reports +family history of exudative ARMD in mother who is 25+ yrs old -- treated at [facility]  - discussed findings, prognosis  - monitor  - pt is cleared from a retina standpoint for release to Dr. Darcey Nora and resumption of primary eye care  - pt can f/u here prn per Dr. Sherral Hammers if there are any changes or concerns on retinal exam  2. Age related cataracts OU  - The symptoms of cataract, surgical options, and treatments and risks were discussed with patient.  - discussed diagnosis and progression  - not yet visually significant  - monitor  Ophthalmic Meds Ordered this visit:  No orders of the defined types were placed in this encounter.    Return if symptoms worsen or fail to improve.  There are no Patient Instructions on file for this visit.   Explained the diagnoses, plan, and follow up with the patient and  they expressed understanding.  Patient expressed understanding of the importance of proper follow up care.   This document serves as a record of services personally performed by Gardiner Sleeper, MD, PhD. It was created on their behalf by Orvan Falconer, an ophthalmic technician. The creation of this record is the provider's dictation and/or activities during the visit.    Electronically signed by: Orvan Falconer, OA, 10/03/21  1:13 PM  Gardiner Sleeper, M.D., Ph.D. Diseases & Surgery of the Retina and Vitreous [facility]  I have reviewed the above documentation for accuracy and completeness, and I agree with the above. Gardiner Sleeper, M.D., Ph.D. 10/03/21 1:16 PM   Abbreviations: M myopia (nearsighted); A astigmatism; H hyperopia (farsighted); P presbyopia; Mrx spectacle prescription;  CTL  contact lenses; OD right eye; OS left eye; OU both eyes  XT exotropia; ET esotropia; PEK punctate epithelial keratitis; PEE punctate epithelial erosions; DES dry eye syndrome; MGD meibomian gland dysfunction; ATs artificial tears; PFAT's preservative free artificial tears; [place] nuclear sclerotic cataract; PSC posterior subcapsular cataract; ERM epi-retinal membrane; PVD posterior vitreous detachment; RD retinal detachment; DM diabetes mellitus; DR diabetic retinopathy; NPDR non-proliferative diabetic retinopathy; PDR proliferative diabetic retinopathy; CSME clinically significant macular edema; DME diabetic macular edema; dbh dot blot hemorrhages; CWS cotton wool spot; POAG primary open angle glaucoma; C/D cup-to-disc ratio; HVF humphrey visual field; GVF goldmann visual field; OCT optical coherence tomography; IOP intraocular pressure; BRVO Branch retinal vein occlusion; CRVO central retinal vein occlusion; CRAO central retinal artery occlusion; BRAO branch retinal artery occlusion; RT retinal tear; SB scleral buckle; PPV pars plana vitrectomy; VH Vitreous hemorrhage; PRP panretinal laser photocoagulation; IVK intravitreal kenalog; VMT vitreomacular traction; MH Macular hole;  NVD neovascularization of the disc; NVE neovascularization elsewhere; AREDS age related eye disease study; ARMD age related macular degeneration; POAG primary open angle glaucoma; EBMD epithelial/anterior basement membrane dystrophy; ACIOL anterior chamber intraocular lens; IOL intraocular lens; PCIOL posterior chamber intraocular lens; Phaco/IOL phacoemulsification with intraocular lens placement; [place] photorefractive keratectomy; LASIK laser assisted in situ keratomileusis; HTN hypertension; DM diabetes mellitus; COPD chronic obstructive pulmonary disease

## 2021-10-01 ENCOUNTER — Ambulatory Visit: Payer: No Typology Code available for payment source | Admitting: Obstetrics and Gynecology

## 2021-10-01 ENCOUNTER — Ambulatory Visit: Payer: No Typology Code available for payment source

## 2021-10-01 ENCOUNTER — Encounter: Payer: Self-pay | Admitting: Obstetrics and Gynecology

## 2021-10-01 VITALS — BP 120/78 | Ht 65.5 in | Wt 150.0 lb

## 2021-10-01 DIAGNOSIS — N393 Stress incontinence (female) (male): Secondary | ICD-10-CM

## 2021-10-01 DIAGNOSIS — Z4689 Encounter for fitting and adjustment of other specified devices: Secondary | ICD-10-CM | POA: Diagnosis not present

## 2021-10-01 NOTE — Progress Notes (Signed)
GYNECOLOGY  VISIT   HPI: 66 y.o.   Married  Caucasian  female   G2P2 with Patient's last menstrual period was 04/15/2008.   here for pessary insertion.   Traveling to [place] in about 2 weeks.   GYNECOLOGIC HISTORY: Patient's last menstrual period was 04/15/2008. Contraception:  PMP Menopausal hormone therapy:  Estradiol vaginal cream Last mammogram:   09-29-20 Neg/BiRads1 Last pap smear:   06-17-18 Neg:Neg HR HPV, 05-29-15 Neg:Neg HR HPV, 04-15-12 normal per patient        OB History     Gravida  2   Para  2   Term      Preterm      AB      Living  2      SAB      IAB      Ectopic      Multiple      Live Births                 Patient Active Problem List   Diagnosis Date Noted   Urinary frequency 01/05/2018   Overactive bladder 06/26/2017   Pelvic pain 06/26/2017   Estrogen deficiency 11/17/2016   Sun-damaged skin 11/17/2016   Low back pain 11/17/2016   Sacroiliac dysfunction 11/12/2016   Osteopenia 11/12/2016   Insomnia 08/15/2015   Hyperlipidemia, mixed 08/21/2014   Headache 06/07/2014   Preventative health care 02/07/2013   Hx: UTI (urinary tract infection) 02/07/2013   Osteoarthritis of both knees 02/07/2013   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    Past Medical History:  Diagnosis Date   Arthritis of right knee 02/07/2013   Chicken pox as a child   COVID-19 virus infection 03/2019, 05/2020   Headache 06/07/2014   History of dental surgery 05/2019   Hx: UTI (urinary tract infection) 02/07/2013   Hyperlipidemia, mixed 08/21/2014   Insomnia 08/15/2015   Low back pain 11/17/2016   Measles as a child   Mumps as a child   Osteoarthritis of both knees 02/07/2013   Osteopenia 11/12/2016   Preventative health care 02/07/2013   Sacroiliac dysfunction 11/12/2016   TMJ syndrome    UTI (urinary tract infection)     Past Surgical History:  Procedure Laterality Date   APPENDECTOMY  3 rd grade   COLONOSCOPY  2018   KN-MAC-prep exc-int hems   DENTAL SURGERY   2021   MOLE REMOVAL     benign- mole on right thigh   scar tissue break up  04/15/2004   Dr Alfred Levins   WISDOM TOOTH EXTRACTION  as a teenager    Current Outpatient Medications  Medication Sig Dispense Refill   Calcium Carbonate-Vitamin D 600-400 MG-UNIT tablet Take 1 tablet by mouth daily.     Cholecalciferol (VITAMIN D3) 125 MCG (5000 UT) CAPS Take 1 capsule by mouth daily at 6 (six) AM.     estradiol (ESTRACE VAGINAL) 0.1 MG/GM vaginal cream Place 1/2 gram per vagina at hs two to three times per week. (Patient taking differently: 3 (three) times a week. Place 1/2 gram per vagina at hs two to three times per week.) 42.5 g 1   LIDODERM 5 % Place 1 patch onto the skin every 12 (twelve) hours as needed. Pt applies as needed 30 patch 2   Magnesium 100 MG CAPS Take by mouth.     meloxicam (MOBIC) 15 MG tablet Take 1 tablet daily with food as needed. 30 tablet 0   neomycin-polymyxin-hydrocortisone (CORTISPORIN) OTIC solution Apply 1 drop  to each big toe as needed after bath or shower 10 mL 0   Omega-3 Fatty Acids (FISH OIL) 1200 MG CAPS Take 1 capsule by mouth daily.     No current facility-administered medications for this visit.     ALLERGIES: Penicillins and Tetracyclines & related  Family History  Problem Relation Age of Onset   Stomach cancer Mother 39   Osteoporosis Mother    Diabetes Mother        type 2   Cancer Mother 83       stomach   Colon polyps Mother 20       benign   Colon cancer Mother 39       Tumor in colon removed Stage 2   Glaucoma Mother    Macular degeneration Mother    Hypertension Father    Hyperlipidemia Father    Colon polyps Sister    Other Sister        herniated disk in back, low blood pressure   Colon polyps Sister    Hypertension Sister    Depression Sister    Hernia Brother        X 2   ADD / ADHD Brother    Cancer Maternal Aunt    COPD Paternal Aunt    Dementia Paternal Aunt    Cancer Paternal Uncle    Cancer Maternal Grandmother         ? lung vs breast   Esophageal cancer Neg Hx    Rectal cancer Neg Hx    Prostate cancer Neg Hx     Social History   Socioeconomic History   Marital status: Married    Spouse name: Not on file   Number of children: Not on file   Years of education: Not on file   Highest education level: Not on file  Occupational History   Not on file  Tobacco Use   Smoking status: Never   Smokeless tobacco: Never  Vaping Use   Vaping Use: Never used  Substance and Sexual Activity   Alcohol use: Not Currently    Alcohol/week: 0.0 - 2.0 standard drinks of alcohol    Comment: occasional beer  2-3/month   Drug use: No   Sexual activity: Yes    Partners: Male    Birth control/protection: Post-menopausal    Comment: lives with husband, exercising regularly, no dietary restrictions  Other Topics Concern   Not on file  Social History Narrative   Not on file   Social Determinants of Health   Financial Resource Strain: Not on file  Food Insecurity: Not on file  Transportation Needs: Not on file  Physical Activity: Not on file  Stress: Not on file  Social Connections: Not on file  Intimate Partner Violence: Not on file    Review of Systems  All other systems reviewed and are negative.   PHYSICAL EXAMINATION:    BP 120/78   Ht 5' 5.5" (1.664 m)   Wt 150 lb (68 kg)   LMP 04/15/2008   BMI 24.58 kg/m     General appearance: alert, cooperative and appears stated age   Pelvic: External genitalia:  no lesions              Urethra:  normal appearing urethra with no masses, tenderness or lesions             Bimanual Exam:  Uterus:  normal size, contour, position, consistency, mobility, non-tender  Adnexa: no mass, fullness, tenderness               Patient able to place but not remove pessary.   Chaperone was present for exam:  Estill Bamberg, CMA.  ASSESSMENT  Stress urinary incontinence.  Encounter to receive pessary.   PLAN  Patient received Milex pessary  incontinence dish 2-1/8" (55 mm), lot [id number], exp 04/25/26.  Pessary care reviewed.  Continue to use vaginal estrogen cream 1/2 gram pv at hs 2 - 3 times per week.  FU in 1- 2 weeks, prior to traveling.   An After Visit Summary was printed and given to the patient.  26 min  total time was spent for this patient encounter, including preparation, face-to-face counseling with the patient, coordination of care, and documentation of the encounter.

## 2021-10-01 NOTE — Patient Instructions (Signed)

## 2021-10-03 ENCOUNTER — Ambulatory Visit (INDEPENDENT_AMBULATORY_CARE_PROVIDER_SITE_OTHER): Payer: No Typology Code available for payment source | Admitting: Ophthalmology

## 2021-10-03 ENCOUNTER — Encounter (INDEPENDENT_AMBULATORY_CARE_PROVIDER_SITE_OTHER): Payer: Self-pay | Admitting: Ophthalmology

## 2021-10-03 DIAGNOSIS — H35363 Drusen (degenerative) of macula, bilateral: Secondary | ICD-10-CM | POA: Diagnosis not present

## 2021-10-03 DIAGNOSIS — H25813 Combined forms of age-related cataract, bilateral: Secondary | ICD-10-CM | POA: Diagnosis not present

## 2021-10-04 ENCOUNTER — Telehealth: Payer: Self-pay | Admitting: *Deleted

## 2021-10-04 NOTE — Telephone Encounter (Signed)
Patient called had visit for pessary insertion on 10/01/21 patient said later that evening when she walked her dog she noticed urine leakage. Patient removed pessary (not problems with removing or re-inserting) and put pessary back in yesterday am. She only notices leakage when dog walking,states it doesn't fall out or feel like it moving inside. She was told to wear a pad and is doing so, but wondered if this will be ongoing issue? Patient said she got the pessary for walking done and for running (not able to run now due to her knee discomfort). Please advise

## 2021-10-04 NOTE — Telephone Encounter (Signed)
Patient informed. 

## 2021-10-05 ENCOUNTER — Ambulatory Visit: Payer: No Typology Code available for payment source

## 2021-10-08 ENCOUNTER — Ambulatory Visit: Payer: No Typology Code available for payment source

## 2021-10-10 ENCOUNTER — Ambulatory Visit: Payer: No Typology Code available for payment source | Admitting: Obstetrics and Gynecology

## 2021-10-11 ENCOUNTER — Ambulatory Visit
Admission: RE | Admit: 2021-10-11 | Discharge: 2021-10-11 | Disposition: A | Discharge status: Home / Self Care | Payer: No Typology Code available for payment source | Source: Ambulatory Visit | Attending: Obstetrics and Gynecology | Admitting: Obstetrics and Gynecology

## 2021-10-11 DIAGNOSIS — Z1231 Encounter for screening mammogram for malignant neoplasm of breast: Secondary | ICD-10-CM

## 2021-10-18 ENCOUNTER — Other Ambulatory Visit: Payer: Self-pay | Admitting: Sports Medicine

## 2021-10-18 ENCOUNTER — Ambulatory Visit: Payer: No Typology Code available for payment source | Admitting: Sports Medicine

## 2021-10-18 ENCOUNTER — Ambulatory Visit: Payer: No Typology Code available for payment source | Admitting: Obstetrics and Gynecology

## 2021-10-18 VITALS — BP 124/78 | Ht 66.0 in | Wt 150.0 lb

## 2021-10-18 DIAGNOSIS — M170 Bilateral primary osteoarthritis of knee: Secondary | ICD-10-CM

## 2021-10-18 MED ORDER — PREDNISONE 10 MG PO TABS: ORAL_TABLET | ORAL | 0 refills | Status: DC

## 2021-10-19 NOTE — Progress Notes (Signed)
   Subjective:    Patient ID: Ellen Morrow, female    DOB: 02-11-1956, 66 y.o.   MRN: [id number]  HPI  Ellen Morrow presents today to discuss oral steroids for her left knee pain.  Previous x-rays showed moderate left knee osteoarthritis.  She has been working with [name] and receiving dry needling.  The swelling that she was getting in the suprapatellar pouch is now moved to the posterior knee.  The pain she was getting previously along the lateral knee with sitting has resolved but she now has posterior knee pain with walking.  She is leaving tomorrow for trip overseas and had messaged [place] about possibly trying some oral steroids.  I then recommended that she return to the office today for [place] to discuss.    Review of Systems As above    Objective:   Physical Exam  Well-developed, well-nourished.  No acute distress  Left knee: Good range of motion.  No obvious effusion.  She is tender to palpation in the popliteal fossa.  Trace patellofemoral crepitus.  Neurovascularly intact distally.  Limited MSK ultrasound of the left knee shows no evidence of knee effusion nor Baker's cyst.      Assessment & Plan:   Left knee pain with x-ray evidence of moderate DJD  I had a long discussion with Ellen Morrow regarding oral steroids versus a cortisone injection.  We decided to go ahead with a 6-day Sterapred Dosepak.  I did warn her of the side effects but I also suggested that she go ahead and fill the prescription prior to leaving the country and take it with her.  I would recommend against further dry needling at this time.  If she continues to have symptoms then I would consider merits of further diagnostic imaging prior to initiating further treatment.  This note was dictated using Dragon naturally speaking software and may contain errors in syntax, spelling, or content which have not been identified prior to signing this note.

## 2022-02-26 ENCOUNTER — Ambulatory Visit: Payer: No Typology Code available for payment source | Admitting: Sports Medicine

## 2022-02-26 VITALS — BP 120/78 | Ht 65.5 in | Wt 149.0 lb

## 2022-02-26 DIAGNOSIS — M170 Bilateral primary osteoarthritis of knee: Secondary | ICD-10-CM | POA: Diagnosis not present

## 2022-02-26 DIAGNOSIS — M21612 Bunion of left foot: Secondary | ICD-10-CM

## 2022-02-26 MED ORDER — MELOXICAM 15 MG PO TABS: ORAL_TABLET | ORAL | 0 refills | Status: DC

## 2022-02-26 NOTE — Progress Notes (Signed)
   Subjective:    Patient ID: Ellen Morrow, female    DOB: 1956/04/04, 66 y.o.   MRN: [id number]  HPI  Ellen Morrow presents today for follow-up on left knee pain and swelling.  She is now also getting similar symptoms in the right knee although not quite as bad.  Previous x-rays did show some moderate medial compartmental narrowing.  She stopped working with her Physiological scientist when she was told to continue doing squats despite persistent knee pain.  She has most recently started a walk/run program but notices that she gets quite a bit of pain afterwards in both knees, left greater than right.  She uses a combination of Advil, Tylenol, and ice which seem to be somewhat helpful.  She also endorses left foot pain secondary to a progressive bunion deformity which again is worse at the end of running.  She has not tried any sort of insert in her running shoes.  Review of Systems As above    Objective:   Physical Exam  Well-developed, well-nourished.  No acute distress  Left knee: Full range of motion.  No obvious effusion.  Trace patellofemoral crepitus.  She is tender to palpation on the medial joint line but negative McMurray's.  Knee remained stable ligamentous exam.  Neurovascularly intact distally.  Left foot: Moderate bunion deformity is present with hallux valgus and crossing of the first and second toes.      Assessment & Plan:   Left knee pain likely secondary to DJD versus degenerative meniscal tear Left foot bunion deformity  For her bunion deformity I recommended that we try a metatarsal pad in her running shoes to give a little more support to the transverse arch.  Although the pain in her left knee may all be secondary to osteoarthritis, I have recommended that we consider an MRI to evaluate further.  She will think about this and I provided her with the information necessary for her to discuss this with her insurance company.  I also discussed the possibility of simply switching to  nonimpact activities such as cycling, spinning, or swimming.  Ultimately this may be what is necessary.

## 2022-02-27 ENCOUNTER — Other Ambulatory Visit: Payer: Self-pay

## 2022-02-27 DIAGNOSIS — G8929 Other chronic pain: Secondary | ICD-10-CM

## 2022-03-25 ENCOUNTER — Telehealth: Payer: Self-pay | Admitting: Sports Medicine

## 2022-03-25 NOTE — Telephone Encounter (Signed)
  I spoke with Ellen Morrow on the phone today after reviewing MRI findings of her left knee.  She does have a complex medial meniscal tear in the setting of moderately advanced osteoarthritis.  She continues to have pain and swelling with activity.  I recommended consultation with Dr. Rhona Raider to discuss next steps in treatment.  Her insurance will be changing at the beginning of the new year so she will contact my office at that time we will plan on making the referral then.  This note was dictated using Dragon naturally speaking software and may contain errors in syntax, spelling, or content which have not been identified prior to signing this note.

## 2022-03-27 ENCOUNTER — Other Ambulatory Visit: Payer: Self-pay | Admitting: Sports Medicine

## 2022-07-02 ENCOUNTER — Ambulatory Visit: Payer: BC Managed Care – PPO | Admitting: Obstetrics and Gynecology

## 2022-07-30 ENCOUNTER — Ambulatory Visit: Payer: BC Managed Care – PPO | Admitting: Obstetrics and Gynecology

## 2022-08-07 NOTE — Progress Notes (Signed)
67 y.o. G2P2 Married Caucasian female here for annual exam.    Tried a pessary and found it difficult to remove.  She is not using it now.  Does wear a pad in case she has stress incontinence.   Has a right meniscus tear, so she is not running now.   Needs refill of vaginal estrogen cream.   Works as a Art therapist.   PCP:   Dr. Chanetta Marshall  Patient's last menstrual period was 04/15/2008.           Sexually active: Yes.    The current method of family planning is post menopausal status.    Exercising: Yes.     Gym 2-3x a week, walk/run 1x a week    Smoker:  no  Health Maintenance: Pap:  06/17/18 neg: HR HPV neg, @/13/17 - normal, neg HR HPV.  History of abnormal Pap:  no MMG:  10/11/21 Breast Density Cat A, BI-RADS CAT 1 neg Colonoscopy:  08/16/21 - polyp - due in 2026. BMD:   07/26/20  Result  low bone mass TDaP:  06/2013 Gardasil:   no HIV: donated blood in the past Hep C: /23/18 neg Screening Labs:  PCP   reports that she has never smoked. She has never used smokeless tobacco. She reports that she does not currently use alcohol. She reports that she does not use drugs.  Past Medical History:  Diagnosis Date   Arthritis of right knee 02/07/2013   Chicken pox as a child   COVID-19 virus infection 03/2019, 05/2020   Headache 06/07/2014   History of dental surgery 05/2019   Hx: UTI (urinary tract infection) 02/07/2013   Hyperlipidemia, mixed 08/21/2014   Insomnia 08/15/2015   Low back pain 11/17/2016   Measles as a child   Mumps as a child   Osteoarthritis of both knees 02/07/2013   Osteopenia 11/12/2016   Preventative health care 02/07/2013   Sacroiliac dysfunction 11/12/2016   TMJ syndrome    UTI (urinary tract infection)     Past Surgical History:  Procedure Laterality Date   APPENDECTOMY  3 rd grade   COLONOSCOPY  2018   KN-MAC-prep exc-int hems   DENTAL SURGERY  2021   MOLE REMOVAL     benign- mole on right thigh   scar tissue break up  04/15/2004   Dr Gwendlyn Deutscher   WISDOM TOOTH EXTRACTION  as a teenager    Current Outpatient Medications  Medication Sig Dispense Refill   Calcium Carbonate-Vitamin D 600-400 MG-UNIT tablet Take 1 tablet by mouth daily.     estradiol (ESTRACE VAGINAL) 0.1 MG/GM vaginal cream Place 1/2 gram per vagina at hs two to three times per week. (Patient taking differently: 3 (three) times a week. Place 1/2 gram per vagina at hs two to three times per week.) 42.5 g 1   LIDODERM 5 % Place 1 patch onto the skin every 12 (twelve) hours as needed. Pt applies as needed 30 patch 2   Magnesium 100 MG CAPS Take by mouth.     Omega-3 Fatty Acids (FISH OIL) 1200 MG CAPS Take 1 capsule by mouth daily.     No current facility-administered medications for this visit.    Family History  Problem Relation Age of Onset   Stomach cancer Mother 34   Osteoporosis Mother    Diabetes Mother        type 2   Cancer Mother 29       stomach   Colon polyps Mother  70       benign   Colon cancer Mother 67       Tumor in colon removed Stage 2   Glaucoma Mother    Macular degeneration Mother    Hypertension Father    Hyperlipidemia Father    Colon polyps Sister    Other Sister        herniated disk in back, low blood pressure   Colon polyps Sister    Hypertension Sister    Depression Sister    Breast cancer Maternal Aunt    Cancer Maternal Aunt    COPD Paternal Aunt    Dementia Paternal Aunt    Cancer Paternal Uncle    Breast cancer Maternal Grandmother    Cancer Maternal Grandmother        ? lung vs breast   Hernia Brother        X 2   ADD / ADHD Brother    Esophageal cancer Neg Hx    Rectal cancer Neg Hx    Prostate cancer Neg Hx     Review of Systems  All other systems reviewed and are negative.   Exam:   BP 110/78 (BP Location: Left Arm, Patient Position: Sitting, Cuff Size: Normal)   Pulse 73   Ht 5' 5.5" (1.664 m)   Wt 149 lb (67.6 kg)   LMP 04/15/2008   SpO2 96%   BMI 24.42 kg/m     General appearance: alert,  cooperative and appears stated age Head: normocephalic, without obvious abnormality, atraumatic Neck: no adenopathy, supple, symmetrical, trachea midline and thyroid normal to inspection and palpation Lungs: clear to auscultation bilaterally Breasts: normal appearance, no masses or tenderness, No nipple retraction or dimpling, No nipple discharge or bleeding, No axillary adenopathy Heart: regular rate and rhythm Abdomen: soft, non-tender; no masses, no organomegaly Extremities: extremities normal, atraumatic, no cyanosis or edema Skin: skin color, texture, turgor normal. No rashes or lesions Lymph nodes: cervical, supraclavicular, and axillary nodes normal. Neurologic: grossly normal  Pelvic: External genitalia:  no lesions              No abnormal inguinal nodes palpated.              Urethra:  normal appearing urethra with no masses, tenderness or lesions              Bartholins and Skenes: normal                 Vagina: normal appearing vagina with normal color and discharge, no lesions              Cervix: no lesions              Pap taken: no Bimanual Exam:  Uterus:  normal size, contour, position, consistency, mobility, non-tender              Adnexa: no mass, fullness, tenderness              Rectal exam: yes.  Confirms.              Anus:  normal sphincter tone, no lesions  Chaperone was present for exam:  Warren Lacy, CMA  Assessment:   Well woman visit with gynecologic exam. GSI. Atrophy. FH colon cancers.  Osteopenia.  Menopausal female.  Plan: Mammogram screening discussed. Self breast awareness reviewed. Pap and HR HPV 2025. Guidelines for Calcium, Vitamin D, regular exercise program including cardiovascular and weight bearing exercise. BMD at [facility] this year.  Refill of vaginal estrogen cream.  I discussed potential effect on breast cancer. Follow up annually and prn.

## 2022-08-20 DIAGNOSIS — L738 Other specified follicular disorders: Secondary | ICD-10-CM | POA: Diagnosis not present

## 2022-08-20 DIAGNOSIS — Z79899 Other long term (current) drug therapy: Secondary | ICD-10-CM | POA: Diagnosis not present

## 2022-08-20 DIAGNOSIS — L570 Actinic keratosis: Secondary | ICD-10-CM | POA: Diagnosis not present

## 2022-08-20 DIAGNOSIS — E559 Vitamin D deficiency, unspecified: Secondary | ICD-10-CM | POA: Diagnosis not present

## 2022-08-20 DIAGNOSIS — E7800 Pure hypercholesterolemia, unspecified: Secondary | ICD-10-CM | POA: Diagnosis not present

## 2022-08-20 DIAGNOSIS — D2271 Melanocytic nevi of right lower limb, including hip: Secondary | ICD-10-CM | POA: Diagnosis not present

## 2022-08-20 DIAGNOSIS — L821 Other seborrheic keratosis: Secondary | ICD-10-CM | POA: Diagnosis not present

## 2022-08-20 DIAGNOSIS — D2239 Melanocytic nevi of other parts of face: Secondary | ICD-10-CM | POA: Diagnosis not present

## 2022-08-21 ENCOUNTER — Ambulatory Visit (INDEPENDENT_AMBULATORY_CARE_PROVIDER_SITE_OTHER): Payer: BC Managed Care – PPO | Admitting: Obstetrics and Gynecology

## 2022-08-21 ENCOUNTER — Encounter: Payer: Self-pay | Admitting: Obstetrics and Gynecology

## 2022-08-21 VITALS — BP 110/78 | HR 73 | Ht 65.5 in | Wt 149.0 lb

## 2022-08-21 DIAGNOSIS — Z23 Encounter for immunization: Secondary | ICD-10-CM | POA: Diagnosis not present

## 2022-08-21 DIAGNOSIS — M8580 Other specified disorders of bone density and structure, unspecified site: Secondary | ICD-10-CM | POA: Diagnosis not present

## 2022-08-21 DIAGNOSIS — Z01419 Encounter for gynecological examination (general) (routine) without abnormal findings: Secondary | ICD-10-CM | POA: Diagnosis not present

## 2022-08-21 DIAGNOSIS — Z0000 Encounter for general adult medical examination without abnormal findings: Secondary | ICD-10-CM | POA: Diagnosis not present

## 2022-08-21 DIAGNOSIS — Z780 Asymptomatic menopausal state: Secondary | ICD-10-CM | POA: Diagnosis not present

## 2022-08-21 MED ORDER — ESTRADIOL 0.1 MG/GM VA CREA: TOPICAL_CREAM | VAGINAL | 1 refills | Status: DC

## 2022-08-21 NOTE — Patient Instructions (Signed)
EXERCISE AND DIET:  We recommended that you start or continue a regular exercise program for good health. Regular exercise means any activity that makes your heart beat faster and makes you sweat.  We recommend exercising at least 30 minutes per day at least 3 days a week, preferably 4 or 5.  We also recommend a diet low in fat and sugar.  Inactivity, poor dietary choices and obesity can cause diabetes, heart attack, stroke, and kidney damage, among others.    ALCOHOL AND SMOKING:  Women should limit their alcohol intake to no more than 7 drinks/beers/glasses of wine (combined, not each!) per week. Moderation of alcohol intake to this level decreases your risk of breast cancer and liver damage. And of course, no recreational drugs are part of a healthy lifestyle.  And absolutely no smoking or even second hand smoke. Most people know smoking can cause heart and lung diseases, but did you know it also contributes to weakening of your bones? Aging of your skin?  Yellowing of your teeth and nails?  CALCIUM AND VITAMIN D:  Adequate intake of calcium and Vitamin D are recommended.  The recommendations for exact amounts of these supplements seem to change often, but generally speaking 600 mg of calcium (either carbonate or citrate) and 800 units of Vitamin D per day seems prudent. Certain women may benefit from higher intake of Vitamin D.  If you are among these women, your doctor will have told you during your visit.    PAP SMEARS:  Pap smears, to check for cervical cancer or precancers,  have traditionally been done yearly, although recent scientific advances have shown that most women can have pap smears less often.  However, every woman still should have a physical exam from her gynecologist every year. It will include a breast check, inspection of the vulva and vagina to check for abnormal growths or skin changes, a visual exam of the cervix, and then an exam to evaluate the size and shape of the uterus and  ovaries.  And after 66 years of age, a rectal exam is indicated to check for rectal cancers. We will also provide age appropriate advice regarding health maintenance, like when you should have certain vaccines, screening for sexually transmitted diseases, bone density testing, colonoscopy, mammograms, etc.   MAMMOGRAMS:  All women over 40 years old should have a yearly mammogram. Many facilities now offer a "3D" mammogram, which may cost around $50 extra out of pocket. If possible,  we recommend you accept the option to have the 3D mammogram performed.  It both reduces the number of women who will be called back for extra views which then turn out to be normal, and it is better than the routine mammogram at detecting truly abnormal areas.    COLONOSCOPY:  Colonoscopy to screen for colon cancer is recommended for all women at age 50.  We know, you hate the idea of the prep.  We agree, BUT, having colon cancer and not knowing it is worse!!  Colon cancer so often starts as a polyp that can be seen and removed at colonscopy, which can quite literally save your life!  And if your first colonoscopy is normal and you have no family history of colon cancer, most women don't have to have it again for 10 years.  Once every ten years, you can do something that may end up saving your life, right?  We will be happy to help you get it scheduled when you are ready.    Be sure to check your insurance coverage so you understand how much it will cost.  It may be covered as a preventative service at no cost, but you should check your particular policy.    Calcium Content in Foods Calcium is the most abundant mineral in the body. Most of the body's calcium supply is stored in bones and teeth. Calcium helps many parts of the body function normally, including: Blood and blood vessels. Nerves. Hormones. Muscles. Bones and teeth. When your calcium stores are low, you may be at risk for low bone mass, bone loss, and broken bones  (fractures). When you get enough calcium, it helps to support strong bones and teeth throughout your life. Calcium is especially important for: Children during growth spurts. Girls during adolescence. Women who are pregnant or breastfeeding. Women after their menstrual cycle stops (postmenopause). Women whose menstrual cycle has stopped due to anorexia nervosa or regular intense exercise. People who cannot eat or digest dairy products. Vegans. Recommended daily amounts of calcium: Women (ages 19 to 50): 1,000 mg per day. Women (ages 51 and older): 1,200 mg per day. Men (ages 19 to 70): 1,000 mg per day. Men (ages 71 and older): 1,200 mg per day. Women (ages 9 to 18): 1,300 mg per day. Men (ages 9 to 18): 1,300 mg per day. General information Eat foods that are high in calcium. Try to get most of your calcium from food. Some people may benefit from taking calcium supplements. Check with your health care provider or diet and nutrition specialist (dietitian) before starting any calcium supplements. Calcium supplements may interact with certain medicines. Too much calcium may cause other health problems, such as constipation and kidney stones. For the body to absorb calcium, it needs vitamin D. Sources of vitamin D include: Skin exposure to direct sunlight. Foods, such as egg yolks, liver, mushrooms, saltwater fish, and fortified milk. Vitamin D supplements. Check with your health care provider or dietitian before starting any vitamin D supplements. What foods are high in calcium?  Foods that are high in calcium contain more than 100 milligrams per serving. Fruits Fortified orange juice or other fruit juice, 300 mg per 8 oz serving. Vegetables Collard greens, 360 mg per 8 oz serving. Kale, 100 mg per 8 oz serving. Bok choy, 160 mg per 8 oz serving. Grains Fortified ready-to-eat cereals, 100 to 1,000 mg per 8 oz serving. Fortified frozen waffles, 200 mg in 2 waffles. Oatmeal, 140 mg in  1 cup. Meats and other proteins Sardines, canned with bones, 325 mg per 3 oz serving. Salmon, canned with bones, 180 mg per 3 oz serving. Canned shrimp, 125 mg per 3 oz serving. Baked beans, 160 mg per 4 oz serving. Tofu, firm, made with calcium sulfate, 253 mg per 4 oz serving. Dairy Yogurt, plain, low-fat, 310 mg per 6 oz serving. Nonfat milk, 300 mg per 8 oz serving. American cheese, 195 mg per 1 oz serving. Cheddar cheese, 205 mg per 1 oz serving. Cottage cheese 2%, 105 mg per 4 oz serving. Fortified soy, rice, or almond milk, 300 mg per 8 oz serving. Mozzarella, part skim, 210 mg per 1 oz serving. The items listed above may not be a complete list of foods high in calcium. Actual amounts of calcium may be different depending on processing. Contact a dietitian for more information. What foods are lower in calcium? Foods that are lower in calcium contain 50 mg or less per serving. Fruits Apple, about 6 mg. Banana, about 12 mg.   Vegetables Lettuce, 19 mg per 2 oz serving. Tomato, about 11 mg. Grains Rice, 4 mg per 6 oz serving. Boiled potatoes, 14 mg per 8 oz serving. White bread, 6 mg per slice. Meats and other proteins Egg, 27 mg per 2 oz serving. Red meat, 7 mg per 4 oz serving. Chicken, 17 mg per 4 oz serving. Fish, cod, or trout, 20 mg per 4 oz serving. Dairy Cream cheese, regular, 14 mg per 1 Tbsp serving. Brie cheese, 50 mg per 1 oz serving. Parmesan cheese, 70 mg per 1 Tbsp serving. The items listed above may not be a complete list of foods lower in calcium. Actual amounts of calcium may be different depending on processing. Contact a dietitian for more information. Summary Calcium is an important mineral in the body because it affects many functions. Getting enough calcium helps support strong bones and teeth throughout your life. Try to get most of your calcium from food. Calcium supplements may interact with certain medicines. Check with your health care provider  or dietitian before starting any calcium supplements. This information is not intended to replace advice given to you by your health care provider. Make sure you discuss any questions you have with your health care provider. Document Revised: 07/28/2019 Document Reviewed: 07/28/2019 Elsevier Patient Education  2023 [employer] Inc.  

## 2022-08-30 ENCOUNTER — Other Ambulatory Visit: Payer: Self-pay | Admitting: Obstetrics and Gynecology

## 2022-08-30 DIAGNOSIS — Z0000 Encounter for general adult medical examination without abnormal findings: Secondary | ICD-10-CM

## 2022-09-24 ENCOUNTER — Ambulatory Visit (INDEPENDENT_AMBULATORY_CARE_PROVIDER_SITE_OTHER): Payer: BC Managed Care – PPO

## 2022-09-24 ENCOUNTER — Other Ambulatory Visit: Payer: Self-pay | Admitting: Obstetrics and Gynecology

## 2022-09-24 DIAGNOSIS — Z780 Asymptomatic menopausal state: Secondary | ICD-10-CM | POA: Diagnosis not present

## 2022-09-24 DIAGNOSIS — Z13820 Encounter for screening for osteoporosis: Secondary | ICD-10-CM | POA: Diagnosis not present

## 2022-09-24 DIAGNOSIS — Z01419 Encounter for gynecological examination (general) (routine) without abnormal findings: Secondary | ICD-10-CM

## 2022-09-24 DIAGNOSIS — M8589 Other specified disorders of bone density and structure, multiple sites: Secondary | ICD-10-CM

## 2022-09-24 DIAGNOSIS — M8580 Other specified disorders of bone density and structure, unspecified site: Secondary | ICD-10-CM

## 2022-10-03 ENCOUNTER — Ambulatory Visit: Payer: No Typology Code available for payment source

## 2022-10-14 ENCOUNTER — Ambulatory Visit: Payer: No Typology Code available for payment source

## 2022-10-22 ENCOUNTER — Ambulatory Visit
Admission: RE | Admit: 2022-10-22 | Discharge: 2022-10-22 | Disposition: A | Discharge status: Home / Self Care | Payer: BC Managed Care – PPO | Source: Ambulatory Visit | Attending: Obstetrics and Gynecology | Admitting: Obstetrics and Gynecology

## 2022-10-22 DIAGNOSIS — Z1231 Encounter for screening mammogram for malignant neoplasm of breast: Secondary | ICD-10-CM | POA: Diagnosis not present

## 2022-10-22 DIAGNOSIS — Z0000 Encounter for general adult medical examination without abnormal findings: Secondary | ICD-10-CM

## 2022-11-01 IMAGING — DX DG KNEE AP/LAT W/ SUNRISE*L*
3 series · 3 of 3 positions shown · non-contrast
Comparison: None.

CLINICAL DATA: Chronic pain x6 months, acute pain in the posterior
knee x1 month

EXAM:
LEFT KNEE 3 VIEWS

[dg knee ap/lat w/ sunrise left (1 of 3)]
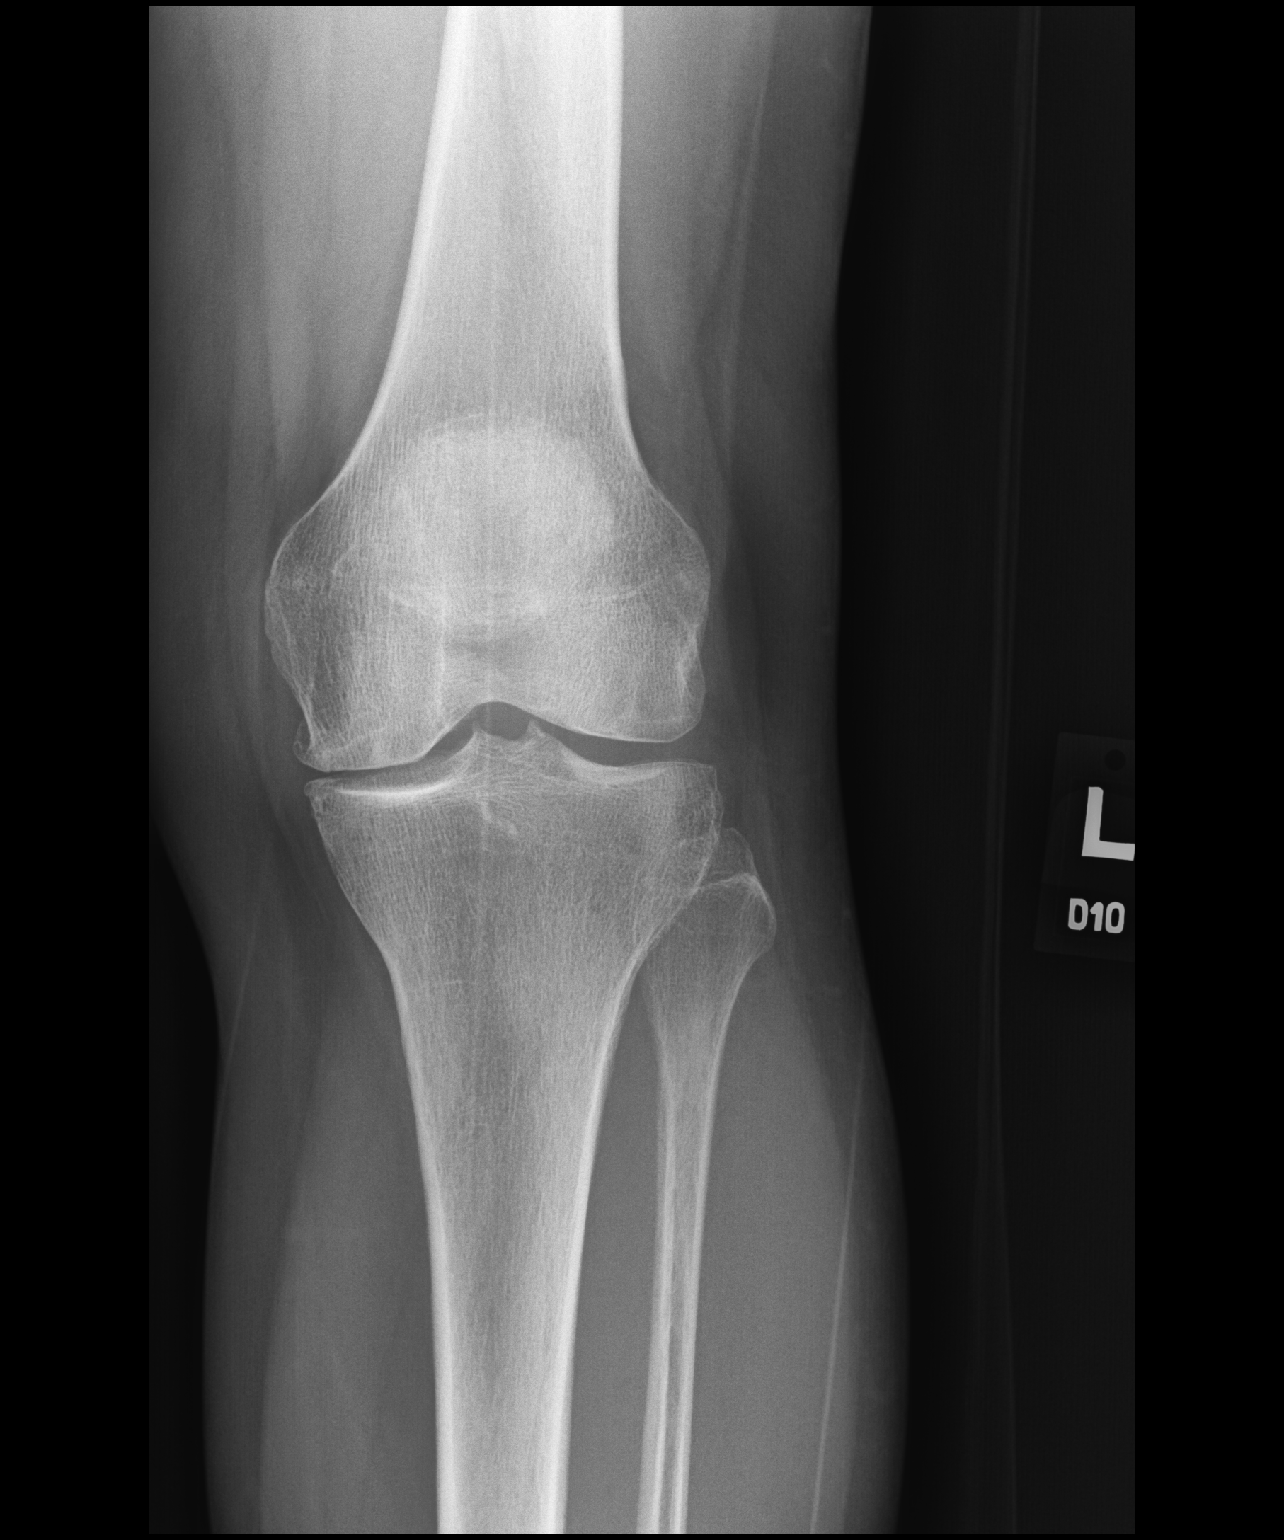

[dg knee ap/lat w/ sunrise left (2 of 3)]
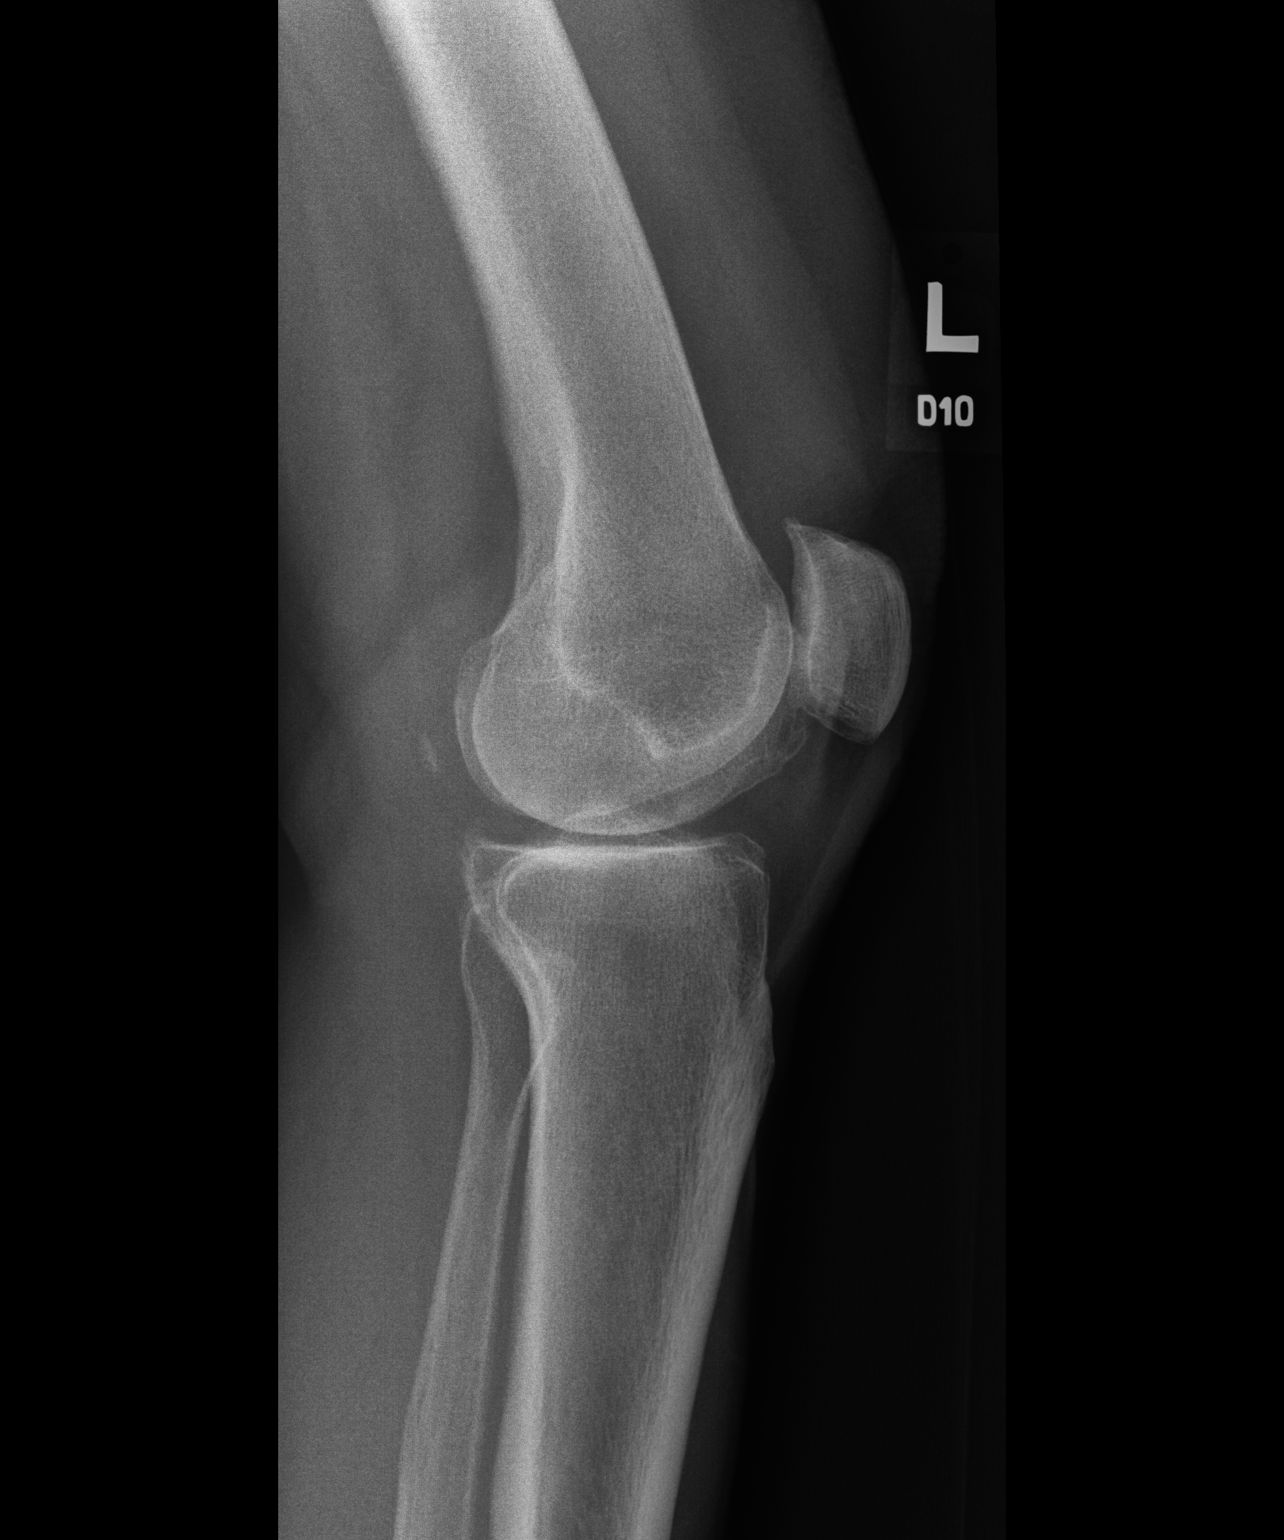

[dg knee ap/lat w/ sunrise left (3 of 3)]
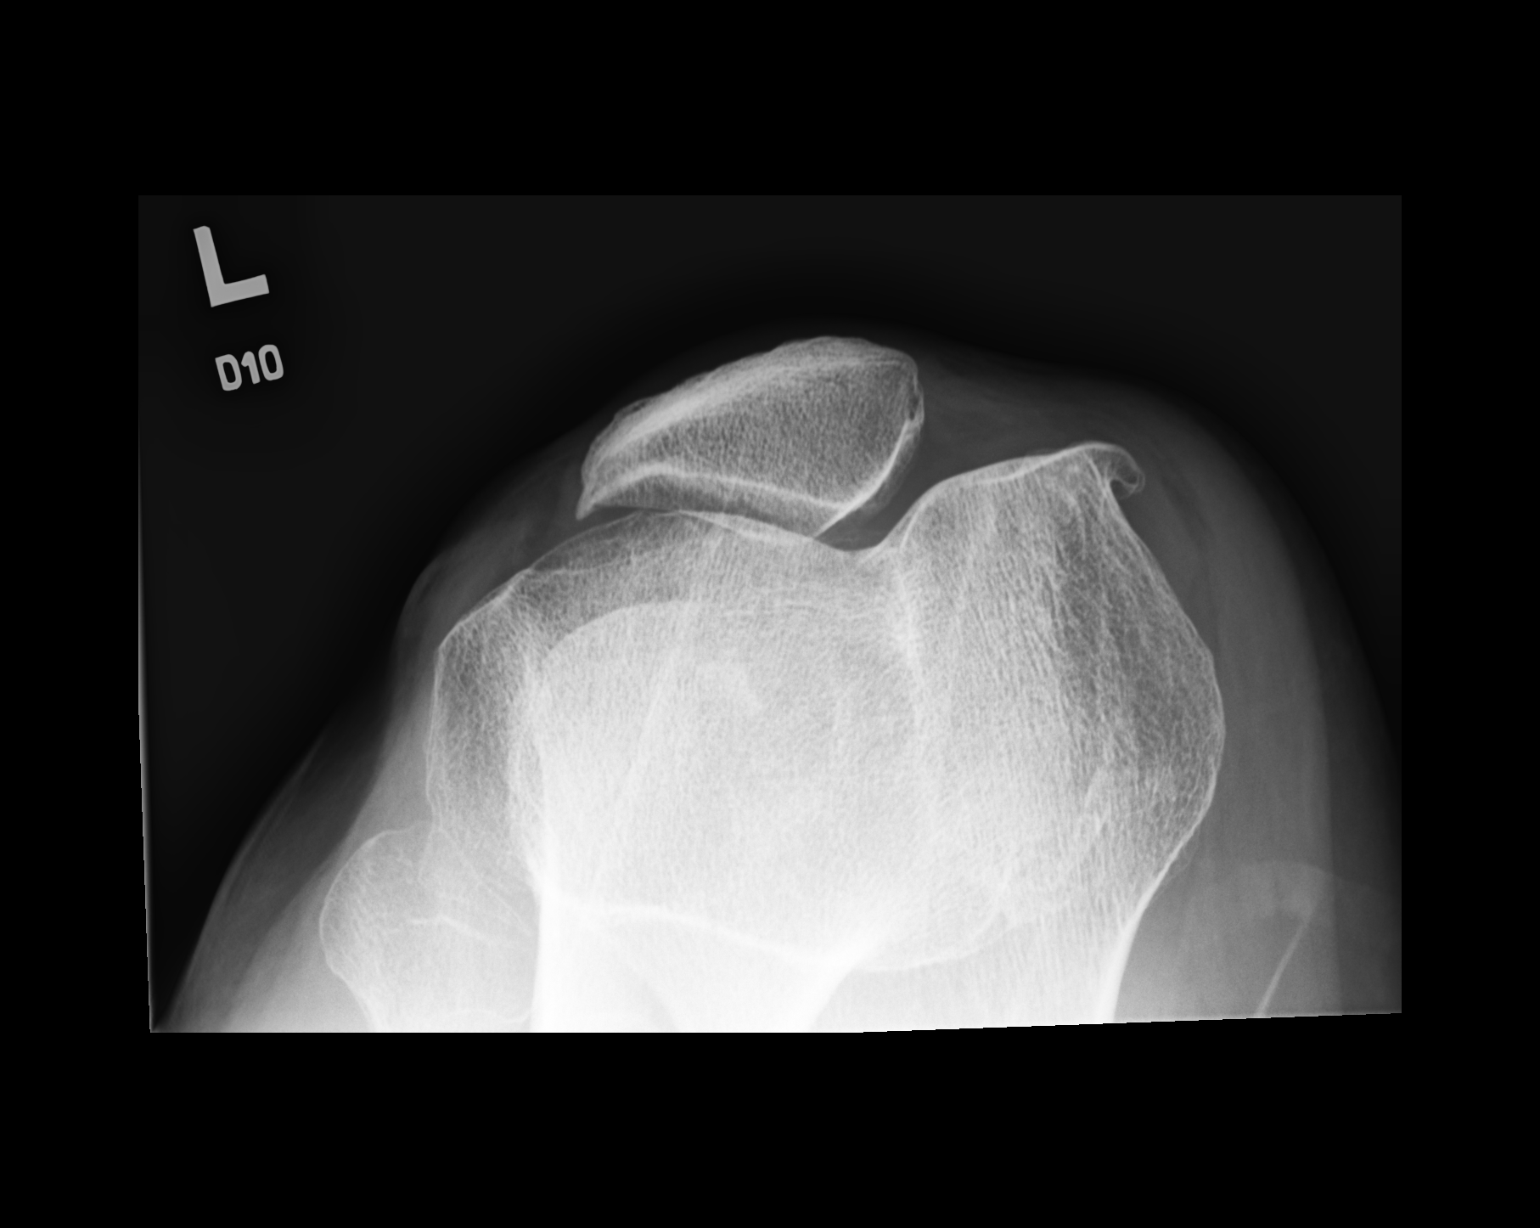

[3 of 3 positions shown; findings below may reference images not displayed]

FINDINGS: No fracture or dislocation is seen. Degenerative changes are noted
with bony spurs in medial and patellofemoral compartments. There is
joint space narrowing in the medial compartment. There is soft
tissue fullness in the suprapatellar bursa.
IMPRESSION: No fracture or dislocation is seen. Degenerative changes are noted
in the medial and patellofemoral compartments. Possible small
effusion is present.

## 2022-11-12 DIAGNOSIS — J029 Acute pharyngitis, unspecified: Secondary | ICD-10-CM | POA: Diagnosis not present

## 2022-11-12 DIAGNOSIS — H9201 Otalgia, right ear: Secondary | ICD-10-CM | POA: Diagnosis not present

## 2022-11-12 DIAGNOSIS — R0981 Nasal congestion: Secondary | ICD-10-CM | POA: Diagnosis not present

## 2022-11-19 DIAGNOSIS — H2513 Age-related nuclear cataract, bilateral: Secondary | ICD-10-CM | POA: Diagnosis not present

## 2022-11-19 DIAGNOSIS — H524 Presbyopia: Secondary | ICD-10-CM | POA: Diagnosis not present

## 2023-01-06 ENCOUNTER — Encounter: Payer: Self-pay | Admitting: Neurology

## 2023-01-06 DIAGNOSIS — R519 Headache, unspecified: Secondary | ICD-10-CM | POA: Diagnosis not present

## 2023-01-28 ENCOUNTER — Encounter: Payer: Self-pay | Admitting: Neurology

## 2023-01-28 ENCOUNTER — Ambulatory Visit: Payer: BC Managed Care – PPO | Admitting: Neurology

## 2023-01-28 VITALS — BP 102/70 | HR 75 | Ht 65.5 in | Wt 153.0 lb

## 2023-01-28 DIAGNOSIS — R208 Other disturbances of skin sensation: Secondary | ICD-10-CM | POA: Diagnosis not present

## 2023-01-28 DIAGNOSIS — R519 Headache, unspecified: Secondary | ICD-10-CM

## 2023-01-28 DIAGNOSIS — R292 Abnormal reflex: Secondary | ICD-10-CM

## 2023-01-28 MED ORDER — GABAPENTIN 300 MG PO CAPS: 300.0000 mg | ORAL_CAPSULE | Freq: Every day | ORAL | Status: AC

## 2023-01-28 NOTE — Progress Notes (Signed)
[facility] Clinic Note - Initial Visit   Date: 01/28/2023   Ellen Morrow MRN: [id number] DOB: 04-23-1955   Dear Dr. Chanetta Marshall:  Thank you for your kind referral of Ellen Morrow for consultation of right head pain. Although her history is well known to you, please allow [place] to reiterate it for the purpose of our medical record. The patient was accompanied to the clinic by self.   Ellen Morrow is a 67 y.o. right-handed female with migraines presenting for evaluation of right head pain.   IMPRESSION/PLAN: Atypical headaches, pain tends to involve the distribution of the lesser occipital nerve.  Pain sparing the ear makes greater auricular neuralgia less likely.  To be complete, she will undergo MRI brain spine wo contrast.  I will also check  MRI cervical spine wo contrast because of neck pain and hyperreflexia on exam.  If this is nondiagnostic, consider neck PT going forward.  For pain, start gabapentin 300mg  at bedtime.    Return to clinic in 4 months  ------------------------------------------------------------- History of present illness: Starting around late 2023, she began having sharp stabbing pain over the lower parietal region just above the ear.  Around spring, she began noticing that the pain was extending behind the ear and has become more achy.  Symptoms are worse at night and prevent her from going to sleeping because her scalp is very tender.  PCP tried her on gabapentin 100mg  at bedtime, and because of no benefit, she decided to stop the medication.  When severe, she takes two Aleve which allows her to sleep better.      Out-side paper records, electronic medical record, and images have been reviewed where available and summarized as:  Lab Results  Component Value Date   TSH 2.01 12/29/2018    Past Medical History:  Diagnosis Date   Arthritis of right knee 02/07/2013   Chicken pox as a child   COVID-19 virus infection 03/2019,  05/2020   Headache 06/07/2014   History of dental surgery 05/2019   Hx: UTI (urinary tract infection) 02/07/2013   Hyperlipidemia, mixed 08/21/2014   Insomnia 08/15/2015   Low back pain 11/17/2016   Measles as a child   Mumps as a child   Osteoarthritis of both knees 02/07/2013   Osteopenia 11/12/2016   Preventative health care 02/07/2013   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   Calcium Carbonate-Vitamin D 600-400 MG-UNIT tablet Take 1 tablet by mouth daily. (Patient not taking: Reported on 01/28/2023)   LIDODERM 5 % Place 1 patch onto the skin every 12 (twelve) hours as needed. Pt applies as needed   Magnesium 100 MG CAPS Take by mouth. (Patient not taking: Reported on 01/28/2023)   Omega-3 Fatty Acids (FISH OIL) 1200 MG CAPS Take 1 capsule by mouth daily.   No facility-administered  encounter medications on file as of 01/28/2023.    Allergies:  Allergies  Allergen Reactions   Penicillins Rash   Tetracyclines & Related Rash    Family History: Family History  Problem Relation Age of Onset   Stomach cancer Mother 33   Osteoporosis Mother    Diabetes Mother        type 2   Cancer Mother 56       stomach   Colon polyps Mother 48       benign   Colon cancer Mother 75       Tumor in colon removed Stage 2   Glaucoma Mother    Macular degeneration Mother    Hypertension Father    Hyperlipidemia Father    Colon polyps Sister    Other Sister         herniated disk in back, low blood pressure   Colon polyps Sister    Hypertension Sister    Depression Sister    Breast cancer Maternal Aunt    Cancer Maternal Aunt    COPD Paternal Aunt    Dementia Paternal Aunt    Cancer Paternal Uncle    Breast cancer Maternal Grandmother    Cancer Maternal Grandmother        ? lung vs breast   Hernia Brother        X 2   ADD / ADHD Brother    Esophageal cancer Neg Hx    Rectal cancer Neg Hx    Prostate cancer Neg Hx     Social History: Social History   Tobacco Use   Smoking status: Never   Smokeless tobacco: Never  Vaping Use   Vaping status: Never Used  Substance Use Topics   Alcohol use: Not Currently    Alcohol/week: 0.0 - 2.0 standard drinks of alcohol    Comment: occasional beer  2-3/month   Drug use: No   Social History   Social History Narrative   Are you right handed or left handed? Right   Are you currently employed ?    What is your current occupation? retired   Do you live at home alone?   Who lives with you? husband   What type of home do you live in: 1 story or 2 story? two    Caffeine 3 glasses    Vital Signs:  BP (!) 142/85   Pulse 75   Ht 5' 5.5" (1.664 m)   Wt 153 lb (69.4 kg)   LMP 04/15/2008   SpO2 98%   BMI 25.07 kg/m   Neurological Exam: MENTAL STATUS including orientation to time, place, person, recent and remote memory, attention span and concentration, language, and fund of knowledge is normal.  Speech is not dysarthric.   Negative Tinel's at the right GON and LON.  CRANIAL NERVES: II:  No visual field defects.     III-IV-VI: Pupils equal round and reactive to light.  Normal conjugate, extra-ocular eye movements in all directions of gaze.  No nystagmus.  No ptosis.   V:  Normal facial sensation.    VII:  Normal facial symmetry and movements.   VIII:  Normal hearing and vestibular function.   IX-X:  Normal palatal movement.   XI:  Normal shoulder shrug and head rotation.   XII:   Normal tongue strength and range of motion, no deviation or fasciculation.  MOTOR: Motor strength is 5/5 throughout.  No atrophy, fasciculations or abnormal movements.  No pronator drift.   MSRs:  Right        Left brachioradialis 2+  2+  biceps 2+  2+  triceps 2+  2+  patellar 2+  2+  ankle jerk 2+  2+  Hoffman no  no  plantar response down  down   SENSORY:  Normal and symmetric perception of light touch, vibration, and temperature.  COORDINATION/GAIT: Normal finger-to- nose-finger.  Intact rapid alternating movements bilaterally.    Gait narrow based and stable.    Thank you for allowing me to participate in patient's care.  If I can answer any additional questions, I would be pleased to do so.    Sincerely,    Kalene Cutler K. Allena Katz, DO

## 2023-01-28 NOTE — Patient Instructions (Addendum)
MRI brain and cervical spine   Start gabapentin 300mg  at bedtime  Consider neck physical therapy going forward

## 2023-02-03 ENCOUNTER — Ambulatory Visit: Payer: BC Managed Care – PPO | Admitting: Neurology

## 2023-02-11 ENCOUNTER — Telehealth: Payer: Self-pay | Admitting: Neurology

## 2023-02-11 NOTE — Telephone Encounter (Signed)
Pt needs to speak to someone about MRI that is sch for tomorrow. She wants to CX it because the radiology report showed the it is TMJ  Pt would like to speak to someone today  so she can call [facility] back

## 2023-02-12 ENCOUNTER — Other Ambulatory Visit: Payer: BC Managed Care – PPO

## 2023-02-12 NOTE — Telephone Encounter (Signed)
Called patient and informed her that she can cancel her MRI. Patient verbalized understanding and had no further questions or concerns.

## 2023-02-12 NOTE — Telephone Encounter (Signed)
OK to cancel. I do not see any imaging appointment for tomorrow.

## 2023-03-03 DIAGNOSIS — H8111 Benign paroxysmal vertigo, right ear: Secondary | ICD-10-CM | POA: Diagnosis not present

## 2023-05-27 ENCOUNTER — Ambulatory Visit: Payer: BC Managed Care – PPO | Admitting: Neurology

## 2023-06-17 DIAGNOSIS — R0683 Snoring: Secondary | ICD-10-CM | POA: Diagnosis not present

## 2023-06-17 DIAGNOSIS — G4719 Other hypersomnia: Secondary | ICD-10-CM | POA: Diagnosis not present

## 2023-07-02 DIAGNOSIS — G4733 Obstructive sleep apnea (adult) (pediatric): Secondary | ICD-10-CM | POA: Diagnosis not present

## 2023-07-24 ENCOUNTER — Other Ambulatory Visit: Payer: Self-pay | Admitting: Obstetrics and Gynecology

## 2023-07-24 DIAGNOSIS — Z1231 Encounter for screening mammogram for malignant neoplasm of breast: Secondary | ICD-10-CM

## 2023-08-06 ENCOUNTER — Ambulatory Visit: Admitting: Sports Medicine

## 2023-08-06 ENCOUNTER — Encounter: Payer: Self-pay | Admitting: Sports Medicine

## 2023-08-06 VITALS — BP 102/60 | Ht 65.5 in | Wt 153.0 lb

## 2023-08-06 DIAGNOSIS — M23204 Derangement of unspecified medial meniscus due to old tear or injury, left knee: Secondary | ICD-10-CM | POA: Diagnosis not present

## 2023-08-06 DIAGNOSIS — M170 Bilateral primary osteoarthritis of knee: Secondary | ICD-10-CM | POA: Diagnosis not present

## 2023-08-06 MED ORDER — MELOXICAM 15 MG PO TABS: ORAL_TABLET | ORAL | 1 refills | Status: AC

## 2023-08-07 DIAGNOSIS — M25562 Pain in left knee: Secondary | ICD-10-CM | POA: Diagnosis not present

## 2023-08-07 DIAGNOSIS — M25561 Pain in right knee: Secondary | ICD-10-CM | POA: Diagnosis not present

## 2023-08-07 NOTE — Progress Notes (Signed)
   Subjective:    Patient ID: Ellen Morrow, female    DOB: [birth year], 68 y.o.   MRN: [id number]  HPI chief complaint: Bilateral knee pain  Ellen Morrow presents today with left knee pain.  She has a history of a meniscal tear and arthritis diagnosed via MRI in December 2023.  At that time we had discussed consultation with Dr. Dalldorf but she was in the process of changing insurances.  When 2024 began her knee began to feel better.  In fact, it improved to the point where she was able to resume some running.  Recently, however, she has began to feel some instability in the left knee.  She has had several episodes of the knee "getting out of place".  It is primarily with twisting and turning.  She does endorse pain and swelling as well.  No recent trauma.  No prior knee surgeries.  She is also beginning to get some right knee pain but no feelings of instability.  That pain has been present only for a couple of weeks  Past medical history reviewed Medications reviewed Allergies reviewed   Review of Systems As above    Objective:   Physical Exam  Well-developed, well-nourished.  No acute distress  Left knee: Range of motion is 0 to 120 degrees.  Trace effusion.  She is tender to palpation along the medial joint line no tenderness along the lateral joint line negative McMurray's, negative Thessaly's.  Knee is grossly stable ligamentous exam.  Neurovascularly intact distally.  Right knee: Good range of motion.  No effusion.  No joint line tenderness.  Good stability.  MRI of the left knee from 2023 is as above      Assessment & Plan:   Left knee pain and instability secondary to osteoarthritis and meniscus tear  I had a long discussion with Ellen Morrow about workup and treatment going forward.  She has a [place] planned [place] in a few weeks so she would like to put off surgical consultation for now.  We will instead try physical therapy [facility] PT) and trial Mobic  15 mg daily for 7 days and then  as needed.  She has done well with Mobic  in the past.  She will follow-up again with me in 4 weeks.  We could consider cortisone injection at that time if she is still painful prior to her overseas trip.  She may ultimately require referral to Dr. Dalldorf to discuss merits of arthroscopy.  This note was dictated using Dragon naturally speaking software and may contain errors in syntax, spelling, or content which have not been identified prior to signing this note.

## 2023-08-11 DIAGNOSIS — M25561 Pain in right knee: Secondary | ICD-10-CM | POA: Diagnosis not present

## 2023-08-11 DIAGNOSIS — M25562 Pain in left knee: Secondary | ICD-10-CM | POA: Diagnosis not present

## 2023-08-20 DIAGNOSIS — M25562 Pain in left knee: Secondary | ICD-10-CM | POA: Diagnosis not present

## 2023-08-20 DIAGNOSIS — M25561 Pain in right knee: Secondary | ICD-10-CM | POA: Diagnosis not present

## 2023-08-20 DIAGNOSIS — G4733 Obstructive sleep apnea (adult) (pediatric): Secondary | ICD-10-CM | POA: Diagnosis not present

## 2023-08-22 DIAGNOSIS — M25561 Pain in right knee: Secondary | ICD-10-CM | POA: Diagnosis not present

## 2023-08-22 DIAGNOSIS — M25562 Pain in left knee: Secondary | ICD-10-CM | POA: Diagnosis not present

## 2023-08-25 DIAGNOSIS — E7800 Pure hypercholesterolemia, unspecified: Secondary | ICD-10-CM | POA: Diagnosis not present

## 2023-08-25 DIAGNOSIS — E559 Vitamin D deficiency, unspecified: Secondary | ICD-10-CM | POA: Diagnosis not present

## 2023-08-25 DIAGNOSIS — Z79899 Other long term (current) drug therapy: Secondary | ICD-10-CM | POA: Diagnosis not present

## 2023-08-26 DIAGNOSIS — M25562 Pain in left knee: Secondary | ICD-10-CM | POA: Diagnosis not present

## 2023-08-26 DIAGNOSIS — M25561 Pain in right knee: Secondary | ICD-10-CM | POA: Diagnosis not present

## 2023-08-27 DIAGNOSIS — Z0000 Encounter for general adult medical examination without abnormal findings: Secondary | ICD-10-CM | POA: Diagnosis not present

## 2023-08-27 DIAGNOSIS — E559 Vitamin D deficiency, unspecified: Secondary | ICD-10-CM | POA: Diagnosis not present

## 2023-08-27 DIAGNOSIS — E7800 Pure hypercholesterolemia, unspecified: Secondary | ICD-10-CM | POA: Diagnosis not present

## 2023-08-27 DIAGNOSIS — Z79899 Other long term (current) drug therapy: Secondary | ICD-10-CM | POA: Diagnosis not present

## 2023-08-28 DIAGNOSIS — M25562 Pain in left knee: Secondary | ICD-10-CM | POA: Diagnosis not present

## 2023-08-28 DIAGNOSIS — M25561 Pain in right knee: Secondary | ICD-10-CM | POA: Diagnosis not present

## 2023-08-29 ENCOUNTER — Ambulatory Visit: Admitting: Family Medicine

## 2023-08-29 VITALS — BP 118/80 | Ht 65.5 in | Wt 158.0 lb

## 2023-08-29 DIAGNOSIS — M25462 Effusion, left knee: Secondary | ICD-10-CM | POA: Diagnosis not present

## 2023-08-29 DIAGNOSIS — M25562 Pain in left knee: Secondary | ICD-10-CM

## 2023-08-29 DIAGNOSIS — G8929 Other chronic pain: Secondary | ICD-10-CM | POA: Insufficient documentation

## 2023-08-29 MED ORDER — METHYLPREDNISOLONE ACETATE 40 MG/ML IJ SUSP
40.0000 mg | Freq: Once | INTRAMUSCULAR | Status: AC
  Administered 2023-08-29: 40 mg via INTRA_ARTICULAR

## 2023-08-29 NOTE — Progress Notes (Addendum)
 PCP: Ransom Byers, MD  Subjective:   HPI: Patient is a 68 y.o. female here for LT knee pain.  Patient reports her LT knee pain has been worse since Christmas with concurrent feelings of instability. She saw Dr. Lovenia Ruby on 4/23 and started 7-days of Meloxicam  and started doing formal PT 2x/week and at-home exercises. She has had minimal improvement in her symptoms and is interested in a steroid injection given that she has an upcoming school reunion this weekend and has a trip to [place] planned on 5/27. She took one Meloxicam  yesterday and has been taking Tylenol  intermittently.  She has a compression knee brace at home.  Past Medical History:  Diagnosis Date   Arthritis of right knee 02/07/2013   Chicken pox as a child   COVID-19 virus infection 03/2019, 05/2020   Headache 06/07/2014   History of dental surgery 05/2019   Hx: UTI (urinary tract infection) 02/07/2013   Hyperlipidemia, mixed 08/21/2014   Insomnia 08/15/2015   Low back pain 11/17/2016   Measles as a child   Mumps as a child   Osteoarthritis of both knees 02/07/2013   Osteopenia 11/12/2016   Preventative health care 02/07/2013   Sacroiliac dysfunction 11/12/2016   TMJ syndrome    UTI (urinary tract infection)     Current Outpatient Medications on File Prior to Visit  Medication Sig Dispense Refill   Calcium Carbonate-Vitamin D 600-400 MG-UNIT tablet Take 1 tablet by mouth daily. (Patient not taking: Reported on 01/28/2023)     estradiol  (ESTRACE  VAGINAL) 0.1 MG/GM vaginal cream Place 1/2 gram per vagina at hs two to three times per week. 42.5 g 1   gabapentin  (NEURONTIN ) 300 MG capsule Take 1 capsule (300 mg total) by mouth at bedtime.     LIDODERM  5 % Place 1 patch onto the skin every 12 (twelve) hours as needed. Pt applies as needed 30 patch 2   Magnesium 100 MG CAPS Take by mouth. (Patient not taking: Reported on 01/28/2023)     meloxicam  (MOBIC ) 15 MG tablet Take 1 tablet with food daily for 7 days, then use as  needed. 40 tablet 1   Omega-3 Fatty Acids (FISH OIL) 1200 MG CAPS Take 1 capsule by mouth daily.     No current facility-administered medications on file prior to visit.    Past Surgical History:  Procedure Laterality Date   APPENDECTOMY  3 rd grade   COLONOSCOPY  2018   KN-MAC-prep exc-int hems   DENTAL SURGERY  2021   MOLE REMOVAL     benign- mole on right thigh   scar tissue break up  04/15/2004   Dr Woodfin Hays   WISDOM TOOTH EXTRACTION  as a teenager    Allergies  Allergen Reactions   Penicillins Rash   Tetracyclines & Related Rash    BP 118/80   Ht 5' 5.5" (1.664 m)   Wt 158 lb (71.7 kg)   LMP 04/15/2008   BMI 25.89 kg/m       No data to display              No data to display              Objective:  Physical Exam:  Gen: NAD, comfortable in exam room   LT Knee Exam: Inspection: Trace effusion present. No erythema Palpation: No warmth. TTP along medial and lateral joint line. No TTP along patella. ROM: Full ROM. Strength: 5/5 strength with knee flexion and extension Special Testing: Positive McMurray.  Negative Apley. No joint laxity with varus and valgus forces. Negative anterior drawer.   Assessment & Plan:  Latajah is a 68yo F with hx of LT knee OA presenting for 27-month hx of worsening LT knee pain with instability.  1. LT Knee Pain, chronic: Patient reports worsening LT knee pain and instability. She was seen by Dr. Lovenia Ruby and started on formal PT and Meloxicam . Deferred injection at that time. Now interested in injection given upcoming international travel. Exam today shows TTP along medial and lateral joint line with trace effusion. No joint laxity. Suspect instability is secondary to pain. Chronic pain is likely due to worsening of OA and degenerative meniscal tears. -Performed steroid injection of LT knee today as noted below. Patient tolerated the procedure well. -Discussed using Meloxicam  PRN for upcoming travel  2. LT Knee Effusion,  acute: Patient reports worsening LT knee pain and has been wearing her compression brace intermittently. Patient is interested in immediate relief given upcoming reunion this weekend. Trace effusion on exam today. Effusion is likely secondary to degenerative meniscal tears. Will aspirate LT knee today. -Aspirated 10cc off the LT knee knee today. Patient tolerated the procedure well. -Advised patient to wear LT knee compression brace throughout the day  PROCEDURE:  Risks & benefits of LT knee aspiration & cortisone injection reviewed. Consent obtained. Time-out completed. Patient prepped and draped in the normal fashion. Area cleansed with chlorhexidene. Ethyl chloride spray used to anesthetize the skin. Solution of 3 mL 1% lidocaine  injected into superior lateral aspect of LT knee for local anesthesia.  After ensuring adequate anesthesia an 18-g 1.5-inch needle on 60-mL syringe inserted into LT knee via superior-lateral approach.  Aspirated 15 cc of yellow synovial fluid - was not sent for analysis.  Needle left in place, syringe exchanged for syringe with solution of 4 mL 1% lidocaine  with 1 mL methylprednisolone  (Depo-medrol ) 40mg /mL.  This was injected into the LT knee without complication. Patient tolerated procedure well without any complications. Area covered with adhesive bandage. Post-procedure care reviewed.  All questions answered.   Unknown Garbe, MS4 [facility] [employer] [facility]  I saw and evaluated the patient independently of MS4 Raymon Caldron, patient presenting with left knee pain that is acute on chronic.  I suspect patient is having likely worsening OA with some mild degenerative changes.  At this time we will go ahead and do steroid injection given patient's upcoming trip as well as discussed using meloxicam  as needed if still no improvement.  Was able to aspirate approximately 15 cc of fluid out of left knee.  Advised patient to continue wearing compression sleeve anytime she is planning  on doing any walking.  Patient to follow-up in the near future if no improvement.

## 2023-09-01 ENCOUNTER — Encounter: Payer: Self-pay | Admitting: Family Medicine

## 2023-09-01 NOTE — Patient Instructions (Signed)

## 2023-09-01 NOTE — Addendum Note (Signed)
 Addended by: Rodgers Clack on: 09/01/2023 09:27 AM   Modules accepted: Level of Service

## 2023-09-04 ENCOUNTER — Ambulatory Visit: Admitting: Sports Medicine

## 2023-09-17 DIAGNOSIS — M25561 Pain in right knee: Secondary | ICD-10-CM | POA: Diagnosis not present

## 2023-09-17 DIAGNOSIS — M25562 Pain in left knee: Secondary | ICD-10-CM | POA: Diagnosis not present

## 2023-09-18 DIAGNOSIS — G4734 Idiopathic sleep related nonobstructive alveolar hypoventilation: Secondary | ICD-10-CM | POA: Diagnosis not present

## 2023-09-18 DIAGNOSIS — G4733 Obstructive sleep apnea (adult) (pediatric): Secondary | ICD-10-CM | POA: Diagnosis not present

## 2023-09-20 DIAGNOSIS — G4733 Obstructive sleep apnea (adult) (pediatric): Secondary | ICD-10-CM | POA: Diagnosis not present

## 2023-09-25 DIAGNOSIS — M25562 Pain in left knee: Secondary | ICD-10-CM | POA: Diagnosis not present

## 2023-09-25 DIAGNOSIS — M25561 Pain in right knee: Secondary | ICD-10-CM | POA: Diagnosis not present

## 2023-09-29 ENCOUNTER — Encounter: Payer: Self-pay | Admitting: Obstetrics and Gynecology

## 2023-09-29 ENCOUNTER — Other Ambulatory Visit (HOSPITAL_COMMUNITY)
Admission: RE | Admit: 2023-09-29 | Discharge: 2023-09-29 | Disposition: A | Discharge status: Home / Self Care | Source: Ambulatory Visit | Attending: Obstetrics and Gynecology | Admitting: Obstetrics and Gynecology

## 2023-09-29 ENCOUNTER — Ambulatory Visit (INDEPENDENT_AMBULATORY_CARE_PROVIDER_SITE_OTHER): Admitting: Obstetrics and Gynecology

## 2023-09-29 VITALS — BP 118/84 | HR 66 | Ht 66.0 in | Wt 154.0 lb

## 2023-09-29 DIAGNOSIS — Z01419 Encounter for gynecological examination (general) (routine) without abnormal findings: Secondary | ICD-10-CM | POA: Diagnosis not present

## 2023-09-29 DIAGNOSIS — Z124 Encounter for screening for malignant neoplasm of cervix: Secondary | ICD-10-CM | POA: Insufficient documentation

## 2023-09-29 MED ORDER — ESTRADIOL 0.1 MG/GM VA CREA: TOPICAL_CREAM | VAGINAL | 1 refills | Status: AC

## 2023-09-29 NOTE — Patient Instructions (Addendum)
 Calcium in Foods Calcium is a mineral in the body. Of all the minerals in your body, you have the most calcium. Most of the body's calcium supply is stored in bones and teeth. Calcium helps many parts of the body work, including: Blood and blood vessels. Nerves. Hormones. Muscles. Bones and teeth. When your calcium stores are low, you may be at risk for low bone mass, bone loss, and broken bones. When you get enough calcium, it helps to support strong bones and teeth throughout your life. Calcium is especially important for: Children during growth spurts. Females during adolescence. Females who are pregnant or breastfeeding. Females after their menstrual cycle stops (postmenopausal). Females whose menstrual cycle has stopped because of an eating disorder or regular intense exercise. People who can't eat or digest dairy products. People who eat a vegan diet. Recommended daily amounts of calcium: Females (ages 87 to 55): 1,000 mg per day. Females (ages 35 and older): 1,200 mg per day. Males (ages 53 to 45): 1,000 mg per day. Males (ages 43 and older): 1,200 mg per day. Females (ages 88 to 28): 1,300 mg per day. Males (ages 41 to 56): 1,300 mg per day. General information Eat foods that are high in calcium. Try to get most of your calcium from food. Some people may benefit from taking calcium supplements. Check with your health care provider or an expert in healthy eating called a dietitian before starting any calcium supplements. Calcium supplements may interact with certain medicines. Too much calcium may cause other health problems, such as trouble pooping and kidney stones. For the body to absorb calcium, it needs vitamin D. Sources of vitamin D include: Skin exposure to direct sunlight. Foods, such as egg yolks, liver, mushrooms, saltwater fish, and fortified milk. Vitamin D supplements. Check with your provider or dietitian before starting any vitamin D supplements. The amount of  calcium that is absorbed in the body varies with type of food. Talk to a dietitian about what foods are best for you, especially if you are eat a vegan diet or don't eat dairy. What foods are high in calcium?  Foods that are high in calcium contain more than 100 milligrams per serving. Fruits Fortified orange juice or other fruit juice, 300 mg per 8 oz (237 mL) serving. Vegetables Collard greens, 260 mg per 1 cup (130 g) serving, cooked. Kale, 180 mg per 1 cup (118 g) serving, cooked. Bok choy, 180 mg per 1 cup (170 g) serving, cooked Grains Fortified frozen waffles, 200 mg in 2 waffles. Oatmeal, 180 mg in 1 cup (234 g) serving, cooked. Fortified white bread, 175 mg per slice. Meats and other proteins Sardines, canned with bones, 350 mg per 3.75 oz (92 g) serving. Salmon, canned with bones, 168 mg per 3 oz (85 g) serving. Canned shrimp, 125 mg per 3 oz (85 g) serving. Baked beans, 120 mg per 1 cup (266 g) serving. Tofu, firm, made with calcium sulfate, 861 mg per  cup (126 g) serving. Dairy Yogurt, plain, low-fat, 448 mg per 1 cup (245 g) serving Nonfat milk, 300 mg per 1 cup (245 g) serving. American cheese, 145 mg per 1 oz (21 g) serving or 1 slice. Cheddar cheese, 200 mg per 1 oz (28 g) serving or 1 slice. Cottage cheese 2%, 125 mg per  cup (113 g) serving. Fortified soy, rice, or almond milk, 300 mg per 1 cup (237 mL) serving. Mozzarella, part skim, 210 mg per 1 oz (21 g) serving. The items listed  above may not be a complete list of foods high in calcium. Actual amounts of calcium may be different depending on processing. Contact a dietitian for more information. What foods are lower in calcium? Foods that are lower in calcium contain 50 mg or less per serving. Fruits Apple, 1 medium, about 6 mg. Banana, 1 medium, about 12 mg. Vegetables Lettuce, 19 mg per 1 cup (35 g) serving. Tomato, 1 small, about 11 mg. Grains Rice, white, 8 mg per  cup (79 g) serving. Boiled  potatoes, 14 mg per 1 cup (160 g) serving. White bread, 6 mg per slice. Meats and other proteins Egg, 24 mg per 1 egg (50 g). Red meat, 7 mg per 4 oz (80 g) serving. Chicken, 17 mg per 4 oz (113 g) serving. Fish, cod, or trout, 20 mg per 4 oz (140 g) serving. Dairy Cream cheese, regular, 14 mg per 1 Tbsp (15 g) serving. Brie cheese, 50 mg per 1 oz (32 g) serving. The items listed above may not be a complete list of foods lower in calcium. Actual amounts of calcium may be different depending on processing. Contact a dietitian for more information. This information is not intended to replace advice given to you by your health care provider. Make sure you discuss any questions you have with your health care provider. Document Revised: 12/28/2022 Document Reviewed: 12/28/2022 Elsevier Patient Education  2024 Else[employer] Inc.  EXERCISE AND DIET:  We recommended that you start or continue a regular exercise program for good health. Regular exercise means any activity that makes your heart beat faster and makes you sweat.  We recommend exercising at least 30 minutes per day at least 3 days a week, preferably 4 or 5.  We also recommend a diet low in fat and sugar.  Inactivity, poor dietary choices and obesity can cause diabetes, heart attack, stroke, and kidney damage, among others.    ALCOHOL AND SMOKING:  Women should limit their alcohol intake to no more than 7 drinks/beers/glasses of wine (combined, not each!) per week. Moderation of alcohol intake to this level decreases your risk of breast cancer and liver damage. And of course, no recreational drugs are part of a healthy lifestyle.  And absolutely no smoking or even second hand smoke. Most people know smoking can cause heart and lung diseases, but did you know it also contributes to weakening of your bones? Aging of your skin?  Yellowing of your teeth and nails?  CALCIUM AND VITAMIN D:  Adequate intake of calcium and Vitamin D are recommended.  The  recommendations for exact amounts of these supplements seem to change often, but generally speaking 600 mg of calcium (either carbonate or citrate) and 800 units of Vitamin D per day seems prudent. Certain women may benefit from higher intake of Vitamin D.  If you are among these women, your doctor will have told you during your visit.    PAP SMEARS:  Pap smears, to check for cervical cancer or precancers,  have traditionally been done yearly, although recent scientific advances have shown that most women can have pap smears less often.  However, every woman still should have a physical exam from her gynecologist every year. It will include a breast check, inspection of the vulva and vagina to check for abnormal growths or skin changes, a visual exam of the cervix, and then an exam to evaluate the size and shape of the uterus and ovaries.  And after 67 years of age, a rectal exam  is indicated to check for rectal cancers. We will also provide age appropriate advice regarding health maintenance, like when you should have certain vaccines, screening for sexually transmitted diseases, bone density testing, colonoscopy, mammograms, etc.   MAMMOGRAMS:  All women over 11 years old should have a yearly mammogram. Many facilities now offer a "3D" mammogram, which may cost around $50 extra out of pocket. If possible,  we recommend you accept the option to have the 3D mammogram performed.  It both reduces the number of women who will be called back for extra views which then turn out to be normal, and it is better than the routine mammogram at detecting truly abnormal areas.    COLONOSCOPY:  Colonoscopy to screen for colon cancer is recommended for all women at age 8.  We know, you hate the idea of the prep.  We agree, BUT, having colon cancer and not knowing it is worse!!  Colon cancer so often starts as a polyp that can be seen and removed at colonscopy, which can quite literally save your life!  And if your first  colonoscopy is normal and you have no family history of colon cancer, most women don't have to have it again for 10 years.  Once every ten years, you can do something that may end up saving your life, right?  We will be happy to help you get it scheduled when you are ready.  Be sure to check your insurance coverage so you understand how much it will cost.  It may be covered as a preventative service at no cost, but you should check your particular policy.

## 2023-09-29 NOTE — Progress Notes (Unsigned)
 68 y.o. G2P2 Married Caucasian female here for annual exam.    Followed for vaginal atrophy.  Using vaginal estrogen cream twice weekly.   Dx with sleep apnea.  Using CPAP.   PCP: Ransom Byers, MD   Patient's last menstrual period was 04/15/2008.           Sexually active: Yes.    The current method of family planning is post menopausal status.    Menopausal hormone therapy:  estrace  cream Exercising: Yes.    Goes to [employer] 2x week Smoker:  no  OB History  Gravida Para Term Preterm AB Living  2 2    2   SAB IAB Ectopic Multiple Live Births          # Outcome Date GA Lbr Len/2nd Weight Sex Type Anes PTL Lv  2 Para           1 Para              HEALTH MAINTENANCE: Last 2 paps:  06/17/18 neg HPV neg  History of abnormal Pap or positive HPV:  no Mammogram:   10/22/22 Breast Density Cat B, BIRADS Cat 1 neg  Colonoscopy:  08/16/21 - polyps, due in 2026.  Bone Density:  09/24/22   Result  osteopenia of hips and spine, FRAX 9.9%/1.3%  Immunization History  Administered Date(s) Administered   Influenza Inj Mdck Quad Pf 12/30/2017   Influenza,inj,Quad PF,6+ Mos 01/31/2015, 12/12/2018   Influenza,inj,Quad PF,6-35 Mos 01/24/2020   Influenza-Unspecified 01/07/2014, 12/19/2015, 12/30/2017   PFIZER(Purple Top)SARS-COV-2 Vaccination 06/28/2019, 07/19/2019, 01/24/2020   Pneumococcal Conjugate-13 02/05/2013   Td 02/05/2013   Tdap 04/15/2002   Zoster Recombinant(Shingrix) 07/04/2017, 11/12/2017   Zoster, Live 03/22/2015      reports that she has never smoked. She has never used smokeless tobacco. She reports current alcohol use. She reports that she does not use drugs.  Past Medical History:  Diagnosis Date   Arthritis of right knee 02/07/2013   Chicken pox as a child   COVID-19 virus infection 03/2019, 05/2020   Headache 06/07/2014   History of dental surgery 05/2019   Hx: UTI (urinary tract infection) 02/07/2013   Hyperlipidemia, mixed 08/21/2014   Insomnia  08/15/2015   Low back pain 11/17/2016   Measles as a child   Mumps as a child   Osteoarthritis of both knees 02/07/2013   Osteopenia 11/12/2016   Preventative health care 02/07/2013   Sacroiliac dysfunction 11/12/2016   Sleep apnea    using CPAP   TMJ syndrome    UTI (urinary tract infection)     Past Surgical History:  Procedure Laterality Date   APPENDECTOMY  3 rd grade   COLONOSCOPY  2018   KN-MAC-prep exc-int hems   DENTAL SURGERY  2021   MOLE REMOVAL     benign- mole on right thigh   scar tissue break up  04/15/2004   Dr Woodfin Hays   WISDOM TOOTH EXTRACTION  as a teenager    Current Outpatient Medications  Medication Sig Dispense Refill   Calcium Carbonate-Vitamin D 600-400 MG-UNIT tablet Take 1 tablet by mouth daily.     estradiol  (ESTRACE  VAGINAL) 0.1 MG/GM vaginal cream Place 1/2 gram per vagina at hs two to three times per week. 42.5 g 1   gabapentin  (NEURONTIN ) 300 MG capsule Take 1 capsule (300 mg total) by mouth at bedtime. (Patient not taking: Reported on 09/29/2023)     LIDODERM  5 % Place 1 patch onto the skin every 12 (  twelve) hours as needed. Pt applies as needed 30 patch 2   Magnesium 100 MG CAPS Take by mouth. (Patient not taking: Reported on 01/28/2023)     meloxicam  (MOBIC ) 15 MG tablet Take 1 tablet with food daily for 7 days, then use as needed. (Patient not taking: Reported on 09/29/2023) 40 tablet 1   Omega-3 Fatty Acids (FISH OIL) 1200 MG CAPS Take 1 capsule by mouth daily.     No current facility-administered medications for this visit.    ALLERGIES: Codeine, Macrolides and ketolides, Penicillins, and Tetracyclines & related  Family History  Problem Relation Age of Onset   Stomach cancer Mother 60   Osteoporosis Mother    Diabetes Mother        type 2   Cancer Mother 4       stomach   Colon polyps Mother 18       benign   Colon cancer Mother 31       Tumor in colon removed Stage 2   Glaucoma Mother    Macular degeneration Mother     Hypertension Father    Hyperlipidemia Father    Colon polyps Sister    Other Sister        herniated disk in back, low blood pressure   Colon polyps Sister    Hypertension Sister    Depression Sister    Breast cancer Maternal Aunt    Cancer Maternal Aunt    COPD Paternal Aunt    Dementia Paternal Aunt    Cancer Paternal Uncle    Breast cancer Maternal Grandmother    Cancer Maternal Grandmother        ? lung vs breast   Hernia Brother        X 2   ADD / ADHD Brother    Esophageal cancer Neg Hx    Rectal cancer Neg Hx    Prostate cancer Neg Hx     Review of Systems  All other systems reviewed and are negative.   PHYSICAL EXAM:  BP 118/84 (BP Location: Right Arm, Patient Position: Sitting)   Pulse 66   Ht 5' 6 (1.676 m)   Wt 154 lb (69.9 kg)   LMP 04/15/2008   SpO2 96%   BMI 24.86 kg/m     General appearance: alert, cooperative and appears stated age Head: normocephalic, without obvious abnormality, atraumatic Neck: no adenopathy, supple, symmetrical, trachea midline and thyroid  normal to inspection and palpation Lungs: clear to auscultation bilaterally Breasts: normal appearance, no masses or tenderness, No nipple retraction or dimpling, No nipple discharge or bleeding, No axillary adenopathy Heart: regular rate and rhythm Abdomen: soft, non-tender; no masses, no organomegaly Extremities: extremities normal, atraumatic, no cyanosis or edema Skin: skin color, texture, turgor normal. No rashes or lesions Lymph nodes: cervical, supraclavicular, and axillary nodes normal. Neurologic: grossly normal  Pelvic: External genitalia:  no lesions              No abnormal inguinal nodes palpated.              Urethra:  normal appearing urethra with no masses, tenderness or lesions              Bartholins and Skenes: normal                 Vagina: normal appearing vagina with normal color and discharge, no lesions              Cervix: no lesions  Pap taken:  yes Bimanual Exam:  Uterus:  normal size, contour, position, consistency, mobility, non-tender              Adnexa: no mass, fullness, tenderness              Rectal exam: yes.  Confirms.              Anus:  normal sphincter tone, small hemorrhoid palpable inside anal opening at about 8:00.  Chaperone was present for exam:  Sophia Dustman, CMA  ASSESSMENT: Well woman visit with gynecologic exam. Cervical cancer screening.  Hx GSI. Vaginal atrophy. FH colon cancers.  Osteopenia.  Menopausal female.  PLAN: Mammogram screening discussed. Self breast awareness reviewed. Pap and HRV collected:  yes Guidelines for Calcium, Vitamin D, regular exercise program including cardiovascular and weight bearing exercise. Medication refills:  Estrace  vaginal cream. I discussed potential effect on breast cancer.  Labs with PCP. Colonoscopy due in 2026.  She will contact her GI if she develops rectal bleeding.  BMD due in 2026.  Follow up:  yearly and prn.

## 2023-10-02 ENCOUNTER — Ambulatory Visit: Payer: Self-pay | Admitting: Obstetrics and Gynecology

## 2023-10-02 LAB — CYTOLOGY - PAP
Comment: NEGATIVE
Diagnosis: NEGATIVE
High risk HPV: NEGATIVE

## 2023-10-06 DIAGNOSIS — M25562 Pain in left knee: Secondary | ICD-10-CM | POA: Diagnosis not present

## 2023-10-06 DIAGNOSIS — M25561 Pain in right knee: Secondary | ICD-10-CM | POA: Diagnosis not present

## 2023-10-09 DIAGNOSIS — M25562 Pain in left knee: Secondary | ICD-10-CM | POA: Diagnosis not present

## 2023-10-09 DIAGNOSIS — M25561 Pain in right knee: Secondary | ICD-10-CM | POA: Diagnosis not present

## 2023-10-13 DIAGNOSIS — M25562 Pain in left knee: Secondary | ICD-10-CM | POA: Diagnosis not present

## 2023-10-13 DIAGNOSIS — M25561 Pain in right knee: Secondary | ICD-10-CM | POA: Diagnosis not present

## 2023-10-16 ENCOUNTER — Ambulatory Visit: Admitting: Obstetrics and Gynecology

## 2023-10-20 DIAGNOSIS — G4733 Obstructive sleep apnea (adult) (pediatric): Secondary | ICD-10-CM | POA: Diagnosis not present

## 2023-10-23 ENCOUNTER — Ambulatory Visit

## 2023-10-23 DIAGNOSIS — M25561 Pain in right knee: Secondary | ICD-10-CM | POA: Diagnosis not present

## 2023-10-23 DIAGNOSIS — M25562 Pain in left knee: Secondary | ICD-10-CM | POA: Diagnosis not present

## 2023-10-27 DIAGNOSIS — M25561 Pain in right knee: Secondary | ICD-10-CM | POA: Diagnosis not present

## 2023-10-27 DIAGNOSIS — M25562 Pain in left knee: Secondary | ICD-10-CM | POA: Diagnosis not present

## 2023-11-03 DIAGNOSIS — D225 Melanocytic nevi of trunk: Secondary | ICD-10-CM | POA: Diagnosis not present

## 2023-11-03 DIAGNOSIS — L814 Other melanin hyperpigmentation: Secondary | ICD-10-CM | POA: Diagnosis not present

## 2023-11-03 DIAGNOSIS — D2239 Melanocytic nevi of other parts of face: Secondary | ICD-10-CM | POA: Diagnosis not present

## 2023-11-03 DIAGNOSIS — L570 Actinic keratosis: Secondary | ICD-10-CM | POA: Diagnosis not present

## 2023-11-03 DIAGNOSIS — D2272 Melanocytic nevi of left lower limb, including hip: Secondary | ICD-10-CM | POA: Diagnosis not present

## 2023-11-03 DIAGNOSIS — M25562 Pain in left knee: Secondary | ICD-10-CM | POA: Diagnosis not present

## 2023-11-03 DIAGNOSIS — M25561 Pain in right knee: Secondary | ICD-10-CM | POA: Diagnosis not present

## 2023-11-04 ENCOUNTER — Inpatient Hospital Stay
Admission: RE | Admit: 2023-11-04 | Discharge: 2023-11-04 | Discharge status: Home / Self Care | Source: Ambulatory Visit | Attending: Obstetrics and Gynecology | Admitting: Obstetrics and Gynecology

## 2023-11-04 DIAGNOSIS — Z1231 Encounter for screening mammogram for malignant neoplasm of breast: Secondary | ICD-10-CM | POA: Diagnosis not present

## 2023-11-20 DIAGNOSIS — G4733 Obstructive sleep apnea (adult) (pediatric): Secondary | ICD-10-CM | POA: Diagnosis not present

## 2023-11-28 DIAGNOSIS — H2513 Age-related nuclear cataract, bilateral: Secondary | ICD-10-CM | POA: Diagnosis not present

## 2023-11-28 DIAGNOSIS — H524 Presbyopia: Secondary | ICD-10-CM | POA: Diagnosis not present

## 2023-12-21 DIAGNOSIS — G4733 Obstructive sleep apnea (adult) (pediatric): Secondary | ICD-10-CM | POA: Diagnosis not present

## 2024-02-20 DIAGNOSIS — G4733 Obstructive sleep apnea (adult) (pediatric): Secondary | ICD-10-CM | POA: Diagnosis not present
